# Patient Record
Sex: Female | Born: 1980
Health system: Southern US, Community
[De-identification: ages and names within clinical notes are randomized; demographics above are authoritative.]

## PROBLEM LIST (undated history)

## (undated) ENCOUNTER — Ambulatory Visit (HOSPITAL_COMMUNITY): Disposition: A | Discharge status: Other Acute Inpatient Hospital | Payer: 59

## (undated) ENCOUNTER — Inpatient Hospital Stay (HOSPITAL_COMMUNITY): Payer: Self-pay

## (undated) DIAGNOSIS — N83209 Unspecified ovarian cyst, unspecified side: Secondary | ICD-10-CM

## (undated) DIAGNOSIS — M549 Dorsalgia, unspecified: Secondary | ICD-10-CM

## (undated) DIAGNOSIS — N8501 Benign endometrial hyperplasia: Secondary | ICD-10-CM

## (undated) DIAGNOSIS — M5127 Other intervertebral disc displacement, lumbosacral region: Secondary | ICD-10-CM

## (undated) DIAGNOSIS — G8929 Other chronic pain: Secondary | ICD-10-CM

## (undated) DIAGNOSIS — K219 Gastro-esophageal reflux disease without esophagitis: Secondary | ICD-10-CM

## (undated) DIAGNOSIS — N200 Calculus of kidney: Secondary | ICD-10-CM

## (undated) DIAGNOSIS — F419 Anxiety disorder, unspecified: Secondary | ICD-10-CM

## (undated) DIAGNOSIS — IMO0002 Reserved for concepts with insufficient information to code with codable children: Secondary | ICD-10-CM

## (undated) DIAGNOSIS — R87619 Unspecified abnormal cytological findings in specimens from cervix uteri: Secondary | ICD-10-CM

## (undated) DIAGNOSIS — N809 Endometriosis, unspecified: Secondary | ICD-10-CM

## (undated) HISTORY — PX: BREAST ENHANCEMENT SURGERY: SHX7

## (undated) HISTORY — PX: LITHOTRIPSY: SUR834

## (undated) HISTORY — PX: DIAGNOSTIC LAPAROSCOPY: SUR761

## (undated) HISTORY — PX: CHOLECYSTECTOMY: SHX55

## (undated) SURGERY — Surgical Case
Anesthesia: *Unknown

---

## 2001-06-09 ENCOUNTER — Emergency Department (HOSPITAL_COMMUNITY): Admission: EM | Admit: 2001-06-09 | Discharge: 2001-06-09 | Payer: Self-pay | Admitting: Emergency Medicine

## 2002-07-05 ENCOUNTER — Emergency Department (HOSPITAL_COMMUNITY): Admission: EM | Admit: 2002-07-05 | Discharge: 2002-07-05 | Payer: Self-pay | Admitting: Emergency Medicine

## 2002-07-31 ENCOUNTER — Emergency Department (HOSPITAL_COMMUNITY): Admission: EM | Admit: 2002-07-31 | Discharge: 2002-07-31 | Payer: Self-pay | Admitting: *Deleted

## 2002-08-20 ENCOUNTER — Inpatient Hospital Stay (HOSPITAL_COMMUNITY): Admission: AD | Admit: 2002-08-20 | Discharge: 2002-08-20 | Payer: Self-pay | Admitting: *Deleted

## 2002-08-20 ENCOUNTER — Encounter: Payer: Self-pay | Admitting: *Deleted

## 2002-09-13 ENCOUNTER — Inpatient Hospital Stay (HOSPITAL_COMMUNITY): Admission: AD | Admit: 2002-09-13 | Discharge: 2002-09-13 | Payer: Self-pay | Admitting: Obstetrics and Gynecology

## 2002-09-22 ENCOUNTER — Encounter: Payer: Self-pay | Admitting: Obstetrics and Gynecology

## 2002-09-22 ENCOUNTER — Inpatient Hospital Stay (HOSPITAL_COMMUNITY): Admission: AD | Admit: 2002-09-22 | Discharge: 2002-09-22 | Payer: Self-pay | Admitting: Obstetrics and Gynecology

## 2002-10-02 ENCOUNTER — Inpatient Hospital Stay (HOSPITAL_COMMUNITY): Admission: AD | Admit: 2002-10-02 | Discharge: 2002-10-02 | Payer: Self-pay | Admitting: Obstetrics and Gynecology

## 2002-10-15 ENCOUNTER — Other Ambulatory Visit: Admission: RE | Admit: 2002-10-15 | Discharge: 2002-10-15 | Payer: Self-pay | Admitting: Obstetrics and Gynecology

## 2003-03-07 ENCOUNTER — Inpatient Hospital Stay (HOSPITAL_COMMUNITY): Admission: AD | Admit: 2003-03-07 | Discharge: 2003-03-07 | Payer: Self-pay | Admitting: Obstetrics and Gynecology

## 2003-10-15 ENCOUNTER — Emergency Department (HOSPITAL_COMMUNITY): Admission: EM | Admit: 2003-10-15 | Discharge: 2003-10-15 | Payer: Self-pay | Admitting: Emergency Medicine

## 2003-11-06 ENCOUNTER — Emergency Department (HOSPITAL_COMMUNITY): Admission: EM | Admit: 2003-11-06 | Discharge: 2003-11-06 | Payer: Self-pay | Admitting: Emergency Medicine

## 2003-12-07 ENCOUNTER — Emergency Department (HOSPITAL_COMMUNITY): Admission: EM | Admit: 2003-12-07 | Discharge: 2003-12-07 | Payer: Self-pay | Admitting: Family Medicine

## 2003-12-18 ENCOUNTER — Emergency Department (HOSPITAL_COMMUNITY): Admission: EM | Admit: 2003-12-18 | Discharge: 2003-12-19 | Payer: Self-pay | Admitting: Emergency Medicine

## 2004-04-26 ENCOUNTER — Emergency Department (HOSPITAL_COMMUNITY): Admission: EM | Admit: 2004-04-26 | Discharge: 2004-04-26 | Payer: Self-pay | Admitting: Emergency Medicine

## 2004-06-23 ENCOUNTER — Emergency Department (HOSPITAL_COMMUNITY): Admission: EM | Admit: 2004-06-23 | Discharge: 2004-06-23 | Payer: Self-pay | Admitting: Family Medicine

## 2004-09-23 ENCOUNTER — Emergency Department (HOSPITAL_COMMUNITY): Admission: EM | Admit: 2004-09-23 | Discharge: 2004-09-23 | Payer: Self-pay | Admitting: Family Medicine

## 2005-01-18 ENCOUNTER — Emergency Department (HOSPITAL_COMMUNITY): Admission: EM | Admit: 2005-01-18 | Discharge: 2005-01-18 | Payer: Self-pay | Admitting: Emergency Medicine

## 2005-02-05 ENCOUNTER — Emergency Department (HOSPITAL_COMMUNITY): Admission: EM | Admit: 2005-02-05 | Discharge: 2005-02-05 | Payer: Self-pay | Admitting: Emergency Medicine

## 2005-04-14 ENCOUNTER — Encounter: Admission: RE | Admit: 2005-04-14 | Discharge: 2005-04-14 | Payer: Self-pay | Admitting: Emergency Medicine

## 2005-04-22 ENCOUNTER — Emergency Department (HOSPITAL_COMMUNITY): Admission: EM | Admit: 2005-04-22 | Discharge: 2005-04-22 | Payer: Self-pay | Admitting: Emergency Medicine

## 2005-08-31 ENCOUNTER — Inpatient Hospital Stay (HOSPITAL_COMMUNITY): Admission: AD | Admit: 2005-08-31 | Discharge: 2005-09-01 | Payer: Self-pay | Admitting: Obstetrics and Gynecology

## 2005-09-02 ENCOUNTER — Ambulatory Visit (HOSPITAL_COMMUNITY): Payer: Self-pay | Admitting: Psychiatry

## 2005-10-13 ENCOUNTER — Emergency Department (HOSPITAL_COMMUNITY): Admission: EM | Admit: 2005-10-13 | Discharge: 2005-10-13 | Payer: Self-pay | Admitting: Emergency Medicine

## 2006-03-02 ENCOUNTER — Encounter: Admission: RE | Admit: 2006-03-02 | Discharge: 2006-03-02 | Payer: Self-pay | Admitting: Emergency Medicine

## 2006-04-16 ENCOUNTER — Emergency Department (HOSPITAL_COMMUNITY): Admission: EM | Admit: 2006-04-16 | Discharge: 2006-04-16 | Payer: Self-pay | Admitting: Emergency Medicine

## 2006-06-08 ENCOUNTER — Emergency Department (HOSPITAL_COMMUNITY): Admission: EM | Admit: 2006-06-08 | Discharge: 2006-06-08 | Payer: Self-pay | Admitting: Emergency Medicine

## 2006-10-14 ENCOUNTER — Emergency Department (HOSPITAL_COMMUNITY): Admission: EM | Admit: 2006-10-14 | Discharge: 2006-10-14 | Payer: Self-pay | Admitting: Family Medicine

## 2006-10-15 ENCOUNTER — Emergency Department (HOSPITAL_COMMUNITY): Admission: EM | Admit: 2006-10-15 | Discharge: 2006-10-15 | Payer: Self-pay | Admitting: Emergency Medicine

## 2006-10-25 ENCOUNTER — Emergency Department (HOSPITAL_COMMUNITY): Admission: EM | Admit: 2006-10-25 | Discharge: 2006-10-25 | Payer: Self-pay | Admitting: Emergency Medicine

## 2007-02-28 ENCOUNTER — Emergency Department (HOSPITAL_COMMUNITY): Admission: EM | Admit: 2007-02-28 | Discharge: 2007-02-28 | Payer: Self-pay | Admitting: Family Medicine

## 2007-04-15 ENCOUNTER — Emergency Department (HOSPITAL_COMMUNITY): Admission: EM | Admit: 2007-04-15 | Discharge: 2007-04-15 | Payer: Self-pay | Admitting: Emergency Medicine

## 2007-04-17 ENCOUNTER — Emergency Department (HOSPITAL_COMMUNITY): Admission: EM | Admit: 2007-04-17 | Discharge: 2007-04-17 | Payer: Self-pay | Admitting: Emergency Medicine

## 2007-04-21 ENCOUNTER — Encounter: Admission: RE | Admit: 2007-04-21 | Discharge: 2007-04-21 | Payer: Self-pay | Admitting: Emergency Medicine

## 2007-07-08 ENCOUNTER — Emergency Department (HOSPITAL_COMMUNITY): Admission: EM | Admit: 2007-07-08 | Discharge: 2007-07-08 | Payer: Self-pay | Admitting: Emergency Medicine

## 2008-02-12 ENCOUNTER — Emergency Department (HOSPITAL_COMMUNITY): Admission: EM | Admit: 2008-02-12 | Discharge: 2008-02-12 | Payer: Self-pay | Admitting: Emergency Medicine

## 2010-01-21 ENCOUNTER — Emergency Department (HOSPITAL_COMMUNITY): Admission: EM | Admit: 2010-01-21 | Discharge: 2010-01-21 | Payer: Self-pay | Admitting: Family Medicine

## 2010-04-08 ENCOUNTER — Emergency Department (HOSPITAL_COMMUNITY): Admission: EM | Admit: 2010-04-08 | Discharge: 2010-04-08 | Payer: Self-pay | Admitting: Emergency Medicine

## 2010-05-03 ENCOUNTER — Emergency Department (HOSPITAL_COMMUNITY): Admission: EM | Admit: 2010-05-03 | Discharge: 2010-05-03 | Payer: Self-pay | Admitting: Emergency Medicine

## 2010-10-28 LAB — URINALYSIS, ROUTINE W REFLEX MICROSCOPIC
Ketones, ur: NEGATIVE mg/dL
Specific Gravity, Urine: 1.021 (ref 1.005–1.030)

## 2010-10-28 LAB — RAPID STREP SCREEN (MED CTR MEBANE ONLY): Streptococcus, Group A Screen (Direct): NEGATIVE

## 2011-05-27 LAB — CLOSTRIDIUM DIFFICILE EIA: C difficile Toxins A+B, EIA: NEGATIVE

## 2011-05-27 LAB — OVA AND PARASITE EXAMINATION: Ova and parasites: NONE SEEN

## 2011-05-27 LAB — STOOL CULTURE

## 2011-08-08 ENCOUNTER — Encounter (HOSPITAL_COMMUNITY): Payer: Self-pay | Admitting: *Deleted

## 2011-08-08 ENCOUNTER — Inpatient Hospital Stay (HOSPITAL_COMMUNITY)

## 2011-08-08 ENCOUNTER — Inpatient Hospital Stay (HOSPITAL_COMMUNITY)
Admission: AD | Admit: 2011-08-08 | Discharge: 2011-08-08 | Disposition: A | Discharge status: Home / Self Care | Source: Ambulatory Visit | Attending: Obstetrics & Gynecology | Admitting: Obstetrics & Gynecology

## 2011-08-08 DIAGNOSIS — R1031 Right lower quadrant pain: Secondary | ICD-10-CM

## 2011-08-08 DIAGNOSIS — O99891 Other specified diseases and conditions complicating pregnancy: Secondary | ICD-10-CM | POA: Insufficient documentation

## 2011-08-08 HISTORY — DX: Unspecified abnormal cytological findings in specimens from cervix uteri: R87.619

## 2011-08-08 HISTORY — DX: Reserved for concepts with insufficient information to code with codable children: IMO0002

## 2011-08-08 HISTORY — DX: Benign endometrial hyperplasia: N85.01

## 2011-08-08 LAB — URINALYSIS, ROUTINE W REFLEX MICROSCOPIC
Bilirubin Urine: NEGATIVE
Hgb urine dipstick: NEGATIVE
Specific Gravity, Urine: >1.03 — ABNORMAL HIGH (ref 1.005–1.030)
pH: 6 (ref 5.0–8.0)

## 2011-08-08 LAB — WET PREP, GENITAL
Clue Cells Wet Prep HPF POC: NONE SEEN
Trich, Wet Prep: NONE SEEN
Yeast Wet Prep HPF POC: NONE SEEN

## 2011-08-08 LAB — CBC
MCH: 30.3 pg (ref 26.0–34.0)
MCHC: 34 g/dL (ref 30.0–36.0)
Platelets: 257 10*3/uL (ref 150–400)
RBC: 3.93 MIL/uL (ref 3.87–5.11)
RDW: 12.8 % (ref 11.5–15.5)

## 2011-08-08 LAB — DIFFERENTIAL
Basophils Absolute: 0 10*3/uL (ref 0.0–0.1)
Basophils Relative: 0 % (ref 0–1)
Eosinophils Absolute: 0.1 10*3/uL (ref 0.0–0.7)
Neutrophils Relative %: 67 % (ref 43–77)

## 2011-08-08 NOTE — Progress Notes (Signed)
HAS HAD 2 C/S- 1-  FTP , OTHER - REPEAT.

## 2011-08-08 NOTE — ED Provider Notes (Signed)
History     No chief complaint on file.  HPIJackie K Pace is 30 y.o. G3P2 Unknown weeks presenting with complaint of acute onset of pain this afternoon. Pain is on the right lower side, intermittent.   She is a patient of Dr. Tamela Oddi, last seen 12/5. Had 2 + pregnancy tests this weekend. Hx of endometriosis, had dx lap with cautery of implants/scar tissue in April by doctor in [place].  Is not on birth control.  Planned pregnancy. No period since April because she was on Lupron until a light period in October.  No bleeding November or December.  Last injection was 6/26.  Denies vaginal bleeding.     Past Medical History  Diagnosis Date  . Abnormal Pap smear   . Endometrial hyperplasia without atypia, simple     Past Surgical History  Procedure Date  . Cesarean section   . Laparoscopic vaginal hysterectomy     diagonostic lap for endmotrosis    Family History  Problem Relation Age of Onset  . Hypertension Mother     History  Substance Use Topics  . Smoking status: Never Smoker   . Smokeless tobacco: Never Used  . Alcohol Use: No    Allergies: Not on File  Prescriptions prior to admission  Medication Sig Dispense Refill  . Prenatal Vit-Fe Fumarate-FA (PRENATAL MULTIVITAMIN) TABS Take 1 tablet by mouth daily.        . Pseudoeph-Doxylamine-DM-APAP (NYQUIL PO) Take 5 mLs by mouth daily as needed. Patient was using this medication for cold/flu.       . zolpidem (AMBIEN) 5 MG tablet Take 2.5 mg by mouth at bedtime as needed. Patient is using 2.5 mg of this medication.  She is using this medication for sleep.         Review of Systems  Constitutional: Negative.   Gastrointestinal: Positive for abdominal pain (lower right pain).  Genitourinary:       Negative for UTI and vaginal sxs.   Physical Exam   Blood pressure 109/63, pulse 104, temperature 99.3 F (37.4 C), temperature source Oral, resp. rate 18, height 5\' 7"  (1.702 m), weight 221 lb 6 oz (100.415 kg), last  menstrual period 06/07/2011.  Physical Exam  Nursing note and vitals reviewed. Constitutional: She is oriented to person, place, and time. She appears well-developed and well-nourished. No distress.  HENT:  Head: Normocephalic.  Neck: Normal range of motion.  Cardiovascular: Normal rate.   Respiratory: Effort normal.  GI: Soft. She exhibits no distension and no mass. There is tenderness (right lower quadrant-mild). There is no rebound and no guarding.  Genitourinary: Uterus is not enlarged and not tender. Cervix exhibits no motion tenderness, no discharge and no friability. Right adnexum displays tenderness. Right adnexum displays no mass and no fullness. Left adnexum displays no mass, no tenderness and no fullness. No tenderness or bleeding around the vagina. No vaginal discharge found.  Neurological: She is alert and oriented to person, place, and time.  Skin: Skin is warm.    MAU Course  Procedures  GC/CHL culture to lab  MDM  Care turned over to E. Felipe Cabell, PA at 21:23   Assessment and Plan    Matt Holmes 08/08/2011, 8:37 PM   Matt Holmes, NP 08/08/11 2124  I have accepted care of this pt from Jeani Sow, FNP.  Results for orders placed during the hospital encounter of 08/08/11 (from the past 24 hour(s))  URINALYSIS, ROUTINE W REFLEX MICROSCOPIC     Status: Abnormal  Collection Time   08/08/11  7:52 PM      Component Value Range   Color, Urine YELLOW  YELLOW    APPearance CLEAR  CLEAR    Specific Gravity, Urine >1.030 (*) 1.005 - 1.030    pH 6.0  5.0 - 8.0    Glucose, UA NEGATIVE  NEGATIVE (mg/dL)   Hgb urine dipstick NEGATIVE  NEGATIVE    Bilirubin Urine NEGATIVE  NEGATIVE    Ketones, ur NEGATIVE  NEGATIVE (mg/dL)   Protein, ur NEGATIVE  NEGATIVE (mg/dL)   Urobilinogen, UA 0.2  0.0 - 1.0 (mg/dL)   Nitrite NEGATIVE  NEGATIVE    Leukocytes, UA NEGATIVE  NEGATIVE   POCT PREGNANCY, URINE     Status: Normal   Collection Time   08/08/11  8:01 PM      Component Value Range     Preg Test, Ur POSITIVE    WET PREP, GENITAL     Status: Abnormal   Collection Time   08/08/11  8:50 PM      Component Value Range   Yeast, Wet Prep NONE SEEN  NONE SEEN    Trich, Wet Prep NONE SEEN  NONE SEEN    Clue Cells, Wet Prep NONE SEEN  NONE SEEN    WBC, Wet Prep HPF POC MODERATE (*) NONE SEEN   ABO/RH     Status: Normal   Collection Time   08/08/11  9:05 PM      Component Value Range   ABO/RH(D) O POS    CBC     Status: Abnormal   Collection Time   08/08/11  9:05 PM      Component Value Range   WBC 11.2 (*) 4.0 - 10.5 (K/uL)   RBC 3.93  3.87 - 5.11 (MIL/uL)   Hemoglobin 11.9 (*) 12.0 - 15.0 (g/dL)   HCT 40.9 (*) 81.1 - 46.0 (%)   MCV 89.1  78.0 - 100.0 (fL)   MCH 30.3  26.0 - 34.0 (pg)   MCHC 34.0  30.0 - 36.0 (g/dL)   RDW 91.4  78.2 - 95.6 (%)   Platelets 257  150 - 400 (K/uL)  DIFFERENTIAL     Status: Normal   Collection Time   08/08/11  9:05 PM      Component Value Range   Neutrophils Relative 67  43 - 77 (%)   Neutro Abs 7.5  1.7 - 7.7 (K/uL)   Lymphocytes Relative 25  12 - 46 (%)   Lymphs Abs 2.8  0.7 - 4.0 (K/uL)   Monocytes Relative 6  3 - 12 (%)   Monocytes Absolute 0.7  0.1 - 1.0 (K/uL)   Eosinophils Relative 1  0 - 5 (%)   Eosinophils Absolute 0.1  0.0 - 0.7 (K/uL)   Basophils Relative 0  0 - 1 (%)   Basophils Absolute 0.0  0.0 - 0.1 (K/uL)    US shows single IUP with yolk sac. EGA of 5.1wks with EDC of 04/08/12.  Pt will f/u with Dr. Gaynell Face. Discussed diet, activity, risks, and precautions.  Clinton Gallant. Elgie Maziarz III, DrHSc, MPAS, PA-C   Henrietta Hoover, [place] 08/08/11 2153

## 2011-08-08 NOTE — Progress Notes (Signed)
NL CYCLE IN OCT.  THEN NOV- NO CYCLE-  NO CYCLE IN DEC- BREAST TENDER- DID  2 HOME PREG TEST- BOTH POST-- TOOK  TEST SAT AND SUN.     SAYS PAIN STARTED IN UPPER ABD AT 4AM ON Sunday- TOOK GASEX-- PAIN WENT AWAY-   SAYS  R   SIDED   PAIN STARTED  AT 530PM.- DID NOT TAKE ANYTHING.  EVERYTIME SHE EATS- SHE FEELS BLOATED.   NO VAG BLEEDING.  USED LUPRON IN April AND June.

## 2011-08-10 LAB — GC/CHLAMYDIA PROBE AMP, GENITAL
Chlamydia, DNA Probe: NEGATIVE
GC Probe Amp, Genital: NEGATIVE

## 2011-09-05 ENCOUNTER — Inpatient Hospital Stay (HOSPITAL_COMMUNITY)
Admission: AD | Admit: 2011-09-05 | Discharge: 2011-09-05 | Disposition: A | Discharge status: Home / Self Care | Source: Ambulatory Visit | Attending: Obstetrics | Admitting: Obstetrics

## 2011-09-05 ENCOUNTER — Encounter (HOSPITAL_COMMUNITY): Payer: Self-pay | Admitting: *Deleted

## 2011-09-05 DIAGNOSIS — N390 Urinary tract infection, site not specified: Secondary | ICD-10-CM

## 2011-09-05 DIAGNOSIS — R300 Dysuria: Secondary | ICD-10-CM | POA: Insufficient documentation

## 2011-09-05 LAB — URINALYSIS, ROUTINE W REFLEX MICROSCOPIC
Ketones, ur: NEGATIVE mg/dL
Leukocytes, UA: NEGATIVE
Protein, ur: NEGATIVE mg/dL
Urobilinogen, UA: 0.2 mg/dL (ref 0.0–1.0)

## 2011-09-05 MED ORDER — PHENAZOPYRIDINE HCL 200 MG PO TABS: 200.0000 mg | ORAL_TABLET | Freq: Three times a day (TID) | ORAL | Status: AC

## 2011-09-05 MED ORDER — PHENAZOPYRIDINE HCL 100 MG PO TABS
200.0000 mg | ORAL_TABLET | Freq: Once | ORAL | Status: AC
  Administered 2011-09-05: 200 mg via ORAL
  Filled 2011-09-05: qty 2

## 2011-09-05 MED ORDER — CEPHALEXIN 500 MG PO CAPS: 500.0000 mg | ORAL_CAPSULE | Freq: Four times a day (QID) | ORAL | Status: AC

## 2011-09-05 NOTE — Progress Notes (Signed)
Pt c/o lower abd pain and pain upon urination for the past two days.  Tried to sit in tub tonight for relief, and as soon as she got out, the pain worsened.  Has an appt tomorrow in office, but could not wait that long.

## 2011-09-05 NOTE — ED Provider Notes (Signed)
History   Pt presents today c/o dysuria and bladder spasms. She states she feels like she is having her typical bladder infection. She has an appt in the am but states she couldn't wait to be seen. She denies fever, vag dc, bleeding.  No chief complaint on file.  HPI  OB History    Grav Para Term Preterm Abortions TAB SAB Ect Mult Living   3 2        2       Past Medical History  Diagnosis Date  . Abnormal Pap smear   . Endometrial hyperplasia without atypia, simple   . Chronic kidney disease   . Pregnancy induced hypertension     Past Surgical History  Procedure Date  . Cesarean section   . Laparoscopic vaginal hysterectomy     diagonostic lap for endmotrosis    Family History  Problem Relation Age of Onset  . Hypertension Mother     History  Substance Use Topics  . Smoking status: Former Smoker -- 3 years    Types: Cigarettes    Quit date: 09/05/2007  . Smokeless tobacco: Never Used  . Alcohol Use: No    Allergies: No Known Allergies  Prescriptions prior to admission  Medication Sig Dispense Refill  . diphenhydrAMINE (BENADRYL) 25 mg capsule Take 25 mg by mouth every 6 (six) hours as needed. Takes as needed,      . Prenatal Vit-Fe Fumarate-FA (PRENATAL MULTIVITAMIN) TABS Take 1 tablet by mouth daily.        Review of Systems  Constitutional: Negative for fever.  Eyes: Negative for blurred vision and double vision.  Cardiovascular: Negative for chest pain and palpitations.  Gastrointestinal: Positive for abdominal pain. Negative for nausea, vomiting, diarrhea and constipation.  Genitourinary: Positive for dysuria, urgency and frequency. Negative for hematuria and flank pain.  Neurological: Negative for dizziness and headaches.  Psychiatric/Behavioral: Negative for depression and suicidal ideas.   Physical Exam   Blood pressure 115/56, pulse 92, temperature 98.5 F (36.9 C), temperature source Oral, resp. rate 16, height 5\' 8"  (1.727 m), weight 220 lb  (99.791 kg), last menstrual period 06/07/2011.  Physical Exam  Nursing note and vitals reviewed. Constitutional: She is oriented to person, place, and time. She appears well-developed and well-nourished. No distress.  HENT:  Head: Normocephalic and atraumatic.  Eyes: EOM are normal. Pupils are equal, round, and reactive to light.  GI: Soft. She exhibits no distension. There is tenderness (tenderness over bladder). There is no rebound and no guarding.  Genitourinary: There is no lesion on the right labia. There is no lesion on the left labia. No erythema, tenderness or bleeding around the vagina. No vaginal discharge found.  Neurological: She is alert and oriented to person, place, and time.  Skin: Skin is warm and dry. She is not diaphoretic.  Psychiatric: She has a normal mood and affect. Her behavior is normal. Judgment and thought content normal.    MAU Course  Procedures  Results for orders placed during the hospital encounter of 09/05/11 (from the past 24 hour(s))  URINALYSIS, ROUTINE W REFLEX MICROSCOPIC     Status: Abnormal   Collection Time   09/05/11 11:00 PM      Component Value Range   Color, Urine YELLOW  YELLOW    APPearance CLEAR  CLEAR    Specific Gravity, Urine >1.030 (*) 1.005 - 1.030    pH 5.5  5.0 - 8.0    Glucose, UA NEGATIVE  NEGATIVE (mg/dL)   Hgb  urine dipstick NEGATIVE  NEGATIVE    Bilirubin Urine NEGATIVE  NEGATIVE    Ketones, ur NEGATIVE  NEGATIVE (mg/dL)   Protein, ur NEGATIVE  NEGATIVE (mg/dL)   Urobilinogen, UA 0.2  0.0 - 1.0 (mg/dL)   Nitrite NEGATIVE  NEGATIVE    Leukocytes, UA NEGATIVE  NEGATIVE    Urine sent for culture.  Assessment and Plan  Dysuria: despite negative UA will tx with keflex and pyridium. She will keep her appt in the am. Discussed diet, activity, risks, and precautions.  Clinton Gallant. Andriana Casa III, DrHSc, MPAS, PA-C  09/05/2011, 11:36 PM   Henrietta Hoover, PA 09/05/11 2340

## 2011-09-06 LAB — OB RESULTS CONSOLE ABO/RH: RH Type: POSITIVE

## 2011-09-09 ENCOUNTER — Ambulatory Visit: Payer: Self-pay | Admitting: Obstetrics

## 2011-09-30 ENCOUNTER — Inpatient Hospital Stay (HOSPITAL_COMMUNITY)
Admission: AD | Admit: 2011-09-30 | Discharge: 2011-09-30 | Disposition: A | Discharge status: Home / Self Care | Source: Ambulatory Visit | Attending: Obstetrics | Admitting: Obstetrics

## 2011-09-30 ENCOUNTER — Encounter (HOSPITAL_COMMUNITY): Payer: Self-pay

## 2011-09-30 DIAGNOSIS — R519 Headache, unspecified: Secondary | ICD-10-CM

## 2011-09-30 DIAGNOSIS — O26899 Other specified pregnancy related conditions, unspecified trimester: Secondary | ICD-10-CM

## 2011-09-30 DIAGNOSIS — R51 Headache: Secondary | ICD-10-CM

## 2011-09-30 DIAGNOSIS — O99891 Other specified diseases and conditions complicating pregnancy: Secondary | ICD-10-CM | POA: Insufficient documentation

## 2011-09-30 DIAGNOSIS — G43909 Migraine, unspecified, not intractable, without status migrainosus: Secondary | ICD-10-CM | POA: Insufficient documentation

## 2011-09-30 LAB — URINALYSIS, ROUTINE W REFLEX MICROSCOPIC
Bilirubin Urine: NEGATIVE
Ketones, ur: NEGATIVE mg/dL
Leukocytes, UA: NEGATIVE
Nitrite: NEGATIVE
Protein, ur: NEGATIVE mg/dL

## 2011-09-30 MED ORDER — LACTATED RINGERS IV BOLUS (SEPSIS)
1000.0000 mL | Freq: Once | INTRAVENOUS | Status: AC
  Administered 2011-09-30: 1000 mL via INTRAVENOUS

## 2011-09-30 MED ORDER — METOCLOPRAMIDE HCL 5 MG/ML IJ SOLN
10.0000 mg | Freq: Once | INTRAMUSCULAR | Status: AC
  Administered 2011-09-30: 10 mg via INTRAVENOUS
  Filled 2011-09-30: qty 2

## 2011-09-30 MED ORDER — DIPHENHYDRAMINE HCL 50 MG/ML IJ SOLN
25.0000 mg | Freq: Once | INTRAMUSCULAR | Status: AC
  Administered 2011-09-30: 50 mg via INTRAVENOUS
  Filled 2011-09-30: qty 1

## 2011-09-30 MED ORDER — DEXAMETHASONE SODIUM PHOSPHATE 10 MG/ML IJ SOLN
10.0000 mg | Freq: Once | INTRAMUSCULAR | Status: AC
  Administered 2011-09-30: 10 mg via INTRAVENOUS
  Filled 2011-09-30: qty 1

## 2011-09-30 NOTE — ED Notes (Signed)
Patient given gingerale and saltine crackers for po challenge.

## 2011-09-30 NOTE — ED Provider Notes (Signed)
History     Chief Complaint  Patient presents with  . Migraine   HPI  Pt is [redacted] weeks pregnant with onset of migraine headache started about yesterday about lunch time.  She took Tylenol at lunch and then took Unisom at bedtime.  She started vomitiing this morning.  She is light sensitive with nausea and vomiting.  She has a history of migraines and has been given Glass blower/designer .  She denies numbness, tingling or spots before her eyes.  She denies vaginal bleeding or cramping or UTI symtpoms.  She has not had any complications with this pregnancy.    Past Medical History  Diagnosis Date  . Abnormal Pap smear   . Endometrial hyperplasia without atypia, simple   . Chronic kidney disease   . Pregnancy induced hypertension     Past Surgical History  Procedure Date  . Cesarean section   . Laparoscopic vaginal hysterectomy     diagonostic lap for endmotrosis    Family History  Problem Relation Age of Onset  . Hypertension Mother     History  Substance Use Topics  . Smoking status: Former Smoker -- 3 years    Types: Cigarettes    Quit date: 09/05/2007  . Smokeless tobacco: Never Used  . Alcohol Use: No    Allergies: No Known Allergies  Prescriptions prior to admission  Medication Sig Dispense Refill  . acetaminophen (TYLENOL) 325 MG tablet Take 650 mg by mouth every 6 (six) hours as needed. Takes for pain      . doxylamine, Sleep, (UNISOM) 25 MG tablet Take 25 mg by mouth at bedtime as needed. Takes for sleep      . Prenatal Vit-Fe Fumarate-FA (PRENATAL MULTIVITAMIN) TABS Take 1 tablet by mouth daily.        Review of Systems  Constitutional: Negative for fever and chills.  HENT: Negative for hearing loss and tinnitus.   Eyes: Positive for photophobia. Negative for blurred vision and double vision.  Gastrointestinal: Positive for nausea. Negative for heartburn, abdominal pain, diarrhea and constipation.  Genitourinary: Negative for dysuria and urgency.  Neurological:  Positive for headaches. Negative for dizziness, tingling, sensory change and focal weakness.   Physical Exam   Blood pressure 111/65, pulse 95, temperature 98.8 F (37.1 C), temperature source Oral, resp. rate 20, height 5' 7.5" (1.715 m), weight 237 lb (107.502 kg), last menstrual period 06/07/2011, SpO2 99.00%.  Physical Exam  Nursing note and vitals reviewed. Constitutional: She is oriented to person, place, and time. She appears well-developed and well-nourished.       Uncomfortable appearing  HENT:  Head: Normocephalic.  Eyes: Pupils are equal, round, and reactive to light.  Neck: Normal range of motion. Neck supple.  Cardiovascular: Normal rate.   Respiratory: Effort normal.  GI: Soft.  Musculoskeletal: Normal range of motion.  Neurological: She is alert and oriented to person, place, and time. She has normal reflexes.  Skin: Skin is warm.  Psychiatric: She has a normal mood and affect.    MAU Course  Procedures  1000cc of LR with Decadron, Reglan and Benadryl IV given to pt- pt's headache pain was reduced to 2/10.  Pt has prescription for Fioricet at home for headache if needed  Assessment and Plan  Migraine Headache in pregnancy F/u with Dr. Clearance Coots for return of symptoms  Romond Pipkins 09/30/2011, 8:09 AM

## 2011-09-30 NOTE — Progress Notes (Signed)
Headache started yesterday, has become a migraine- pain in left eye. N/v started last night.  Took unisom. Pt is light sensative, vomiting continues

## 2011-10-03 ENCOUNTER — Encounter: Payer: Self-pay | Admitting: Maternal & Fetal Medicine

## 2011-11-14 ENCOUNTER — Encounter: Payer: Self-pay | Admitting: Maternal & Fetal Medicine

## 2011-12-08 ENCOUNTER — Inpatient Hospital Stay (HOSPITAL_COMMUNITY)
Admission: AD | Admit: 2011-12-08 | Discharge: 2011-12-08 | Disposition: A | Discharge status: Home / Self Care | Source: Ambulatory Visit | Attending: Obstetrics & Gynecology | Admitting: Obstetrics & Gynecology

## 2011-12-08 ENCOUNTER — Encounter (HOSPITAL_COMMUNITY): Payer: Self-pay | Admitting: *Deleted

## 2011-12-08 DIAGNOSIS — H109 Unspecified conjunctivitis: Secondary | ICD-10-CM | POA: Insufficient documentation

## 2011-12-08 DIAGNOSIS — O99891 Other specified diseases and conditions complicating pregnancy: Secondary | ICD-10-CM | POA: Insufficient documentation

## 2011-12-08 DIAGNOSIS — H579 Unspecified disorder of eye and adnexa: Secondary | ICD-10-CM | POA: Insufficient documentation

## 2011-12-08 MED ORDER — TOBRAMYCIN 0.3 % OP SOLN: 1.0000 [drp] | OPHTHALMIC | Status: AC

## 2011-12-08 NOTE — Discharge Instructions (Signed)
Take benadryl for itching and allergies. Use the eye drops as directed. If your symptoms do not improve call Dr. Bronson Ing office for follow up.  Conjunctivitis Conjunctivitis is commonly called "pink eye." Conjunctivitis can be caused by bacterial or viral infection, allergies, or injuries. There is usually redness of the lining of the eye, itching, discomfort, and sometimes discharge. There may be deposits of matter along the eyelids. A viral infection usually causes a watery discharge, while a bacterial infection causes a yellowish, thick discharge. Pink eye is very contagious and spreads by direct contact. You may be given antibiotic eyedrops as part of your treatment. Before using your eye medicine, remove all drainage from the eye by washing gently with warm water and cotton balls. Continue to use the medication until you have awakened 2 mornings in a row without discharge from the eye. Do not rub your eye. This increases the irritation and helps spread infection. Use separate towels from other household members. Wash your hands with soap and water before and after touching your eyes. Use cold compresses to reduce pain and sunglasses to relieve irritation from light. Do not wear contact lenses or wear eye makeup until the infection is gone. SEEK MEDICAL CARE IF:   Your symptoms are not better after 3 days of treatment.   You have increased pain or trouble seeing.   The outer eyelids become very red or swollen.  Document Released: 09/08/2004 Document Revised: 07/21/2011 Document Reviewed: 08/01/2005 [facility] Patient Information 2012 [place], [place].

## 2011-12-08 NOTE — MAU Note (Signed)
L eye very sore & sensitive since this a.m.  Eye appears very read, watery.  Pt C/O round ligament pain, denies uc's, bleeding, or LOF.

## 2011-12-08 NOTE — MAU Provider Note (Signed)
History     CSN: [id number]  Arrival date & time 12/08/11  1449   None     Chief Complaint  Patient presents with  . Eye Pain    HPI Ana Pace is a 31 y.o. female @ [redacted]w[redacted]d gestation who presents to MAU for eye itching and irritation. The symptoms started this morning and have gotten worse as the day has progressed. Both eyes are affected. She denies any problems with the pregnancy other than round ligament pain which her doctor has told her to get a maternity girdle for support. The history was provided by the patient.  Past Medical History  Diagnosis Date  . Abnormal Pap smear   . Endometrial hyperplasia without atypia, simple   . Pregnancy induced hypertension     Past Surgical History  Procedure Date  . Cesarean section   . Laparoscopic vaginal hysterectomy     diagonostic lap for endmotrosis    Family History  Problem Relation Age of Onset  . Hypertension Mother     History  Substance Use Topics  . Smoking status: Former Smoker -- 3 years    Types: Cigarettes    Quit date: 09/05/2007  . Smokeless tobacco: Never Used  . Alcohol Use: No    OB History    Grav Para Term Preterm Abortions TAB SAB Ect Mult Living   3 2        2       Review of Systems  Constitutional: Negative for fever, chills and activity change.  HENT: Positive for congestion and sore throat. Negative for facial swelling and neck pain.   Eyes: Positive for photophobia, redness and itching.  Cardiovascular: Negative for chest pain.  Gastrointestinal: Negative for nausea and vomiting.       Round ligament pain.  Genitourinary: Negative for dysuria.  Musculoskeletal: Positive for back pain.  Neurological: Negative for dizziness and headaches.  Psychiatric/Behavioral: Negative for confusion.    Allergies  Review of patient's allergies indicates no known allergies.  Home Medications  No current outpatient prescriptions on file.  BP 127/64  Pulse 99  Temp(Src) 98.9 F (37.2 C)  (Oral)  Resp 20  Ht 5\' 8"  (1.727 m)  Wt 249 lb (112.946 kg)  BMI 37.86 kg/m2  LMP 06/07/2011  Physical Exam  Nursing note and vitals reviewed. Constitutional: She is oriented to person, place, and time. She appears well-developed and well-nourished. No distress.  HENT:  Head: Normocephalic.  Eyes: EOM and lids are normal. Pupils are equal, round, and reactive to light. Right conjunctiva is injected. Left conjunctiva is injected.       Conjunctiva injected. Sclera with erythema.   Neck: Neck supple.  Cardiovascular: Normal rate.   Pulmonary/Chest: Effort normal.  Abdominal: Soft.       Gravid consistent with dates. Positive FHT's.  Musculoskeletal: Normal range of motion.  Neurological: She is alert and oriented to person, place, and time. No cranial nerve deficit.  Skin: Skin is warm and dry.  Psychiatric: She has a normal mood and affect. Her behavior is normal. Judgment and thought content normal.   Assessment: Conjunctivitis  Plan:  Benadryl for allergies   Tobramycin opth. Drops Rx   Follow up with Dr. Tamela Pace or go for  Opthalmology follow up    Return as needed   ED Course  Procedures  MDM

## 2012-01-21 ENCOUNTER — Inpatient Hospital Stay (HOSPITAL_COMMUNITY)
Admission: AD | Admit: 2012-01-21 | Discharge: 2012-01-21 | Disposition: A | Discharge status: Home / Self Care | Source: Ambulatory Visit | Attending: Obstetrics & Gynecology | Admitting: Obstetrics & Gynecology

## 2012-01-21 DIAGNOSIS — J309 Allergic rhinitis, unspecified: Secondary | ICD-10-CM

## 2012-01-21 MED ORDER — FEXOFENADINE-PSEUDOEPHED ER 60-120 MG PO TB12: 1.0000 | ORAL_TABLET | Freq: Two times a day (BID) | ORAL | Status: DC

## 2012-01-21 NOTE — MAU Note (Signed)
Patient c/o of left ear infection onset two days, no fever 29 weeks.

## 2012-01-21 NOTE — MAU Provider Note (Signed)
History   Pt presents today c/o Lt earache. She states it feels like she is hearing out of a drum. She also c/o itchy ears and throat. She denies SOB, chest pain, cough, fever, or any other problems at this time.  CSN: [id number]  Arrival date and time: 01/21/12 1518   None     Chief Complaint  Patient presents with  . Otalgia   HPI  OB History    Grav Para Term Preterm Abortions TAB SAB Ect Mult Living   3 2        2       Past Medical History  Diagnosis Date  . Abnormal Pap smear   . Endometrial hyperplasia without atypia, simple   . Pregnancy induced hypertension     Past Surgical History  Procedure Date  . Cesarean section   . Laparoscopic vaginal hysterectomy     diagonostic lap for endmotrosis    Family History  Problem Relation Age of Onset  . Hypertension Mother     History  Substance Use Topics  . Smoking status: Former Smoker -- 3 years    Types: Cigarettes    Quit date: 09/05/2007  . Smokeless tobacco: Never Used  . Alcohol Use: No    Allergies: No Known Allergies  Prescriptions prior to admission  Medication Sig Dispense Refill  . acetaminophen (TYLENOL) 325 MG tablet Take 650 mg by mouth every 6 (six) hours as needed. Takes for pain,      . diphenhydrAMINE (BENADRYL) 25 MG tablet Take 25 mg by mouth every 6 (six) hours as needed. pollin allergy      . doxylamine, Sleep, (UNISOM) 25 MG tablet Take 25 mg by mouth at bedtime as needed. Takes for sleep      . Prenatal Vit-Fe Fumarate-FA (PRENATAL MULTIVITAMIN) TABS Take 1 tablet by mouth daily.        Review of Systems  Constitutional: Negative for fever and chills.  HENT: Positive for ear pain, congestion and sore throat. Negative for hearing loss and ear discharge.   Eyes: Negative for blurred vision, double vision, photophobia, pain and discharge.  Respiratory: Negative for stridor.   Cardiovascular: Negative for chest pain and palpitations.  Neurological: Negative for headaches.    Physical Exam   Blood pressure 111/62, pulse 97, temperature 99 F (37.2 C), temperature source Oral, resp. rate 16, height 5\' 8"  (1.727 m), weight 259 lb 6.4 oz (117.663 kg), last menstrual period 06/07/2011.  Physical Exam  Constitutional: She is oriented to person, place, and time. She appears well-developed and well-nourished. No distress.  HENT:  Head: Normocephalic and atraumatic.  Right Ear: External ear normal.  Left Ear: External ear normal.  Nose: Nose normal.  Mouth/Throat: Oropharynx is clear and moist. No oropharyngeal exudate.  Eyes: EOM are normal. Pupils are equal, round, and reactive to light.  Neck: Normal range of motion.  GI: Soft. She exhibits no distension and no mass. There is no tenderness. There is no rebound and no guarding.  Neurological: She is alert and oriented to person, place, and time.  Skin: Skin is warm and dry. She is not diaphoretic.  Psychiatric: She has a normal mood and affect. Her behavior is normal. Judgment and thought content normal.    MAU Course  Procedures    Assessment and Plan  Allergic rhinitis: discussed with pt at length. Will give Rx for Allegra. She will f/u with Dr. Tamela Oddi. Discussed diet, activity, risks, and precautions.  Clinton Gallant. Kensington Duerst III, DrHSc, MPAS,  PA-C  01/21/2012, 4:30 PM

## 2012-02-08 ENCOUNTER — Other Ambulatory Visit: Payer: Self-pay | Admitting: Obstetrics & Gynecology

## 2012-02-08 DIAGNOSIS — Z3689 Encounter for other specified antenatal screening: Secondary | ICD-10-CM

## 2012-02-13 ENCOUNTER — Other Ambulatory Visit: Payer: Self-pay | Admitting: Obstetrics & Gynecology

## 2012-02-13 ENCOUNTER — Ambulatory Visit (HOSPITAL_COMMUNITY)
Admission: RE | Admit: 2012-02-13 | Discharge: 2012-02-13 | Disposition: A | Discharge status: Home / Self Care | Payer: Medicaid Other | Source: Ambulatory Visit | Attending: Obstetrics & Gynecology | Admitting: Obstetrics & Gynecology

## 2012-02-13 DIAGNOSIS — Z3689 Encounter for other specified antenatal screening: Secondary | ICD-10-CM

## 2012-03-13 ENCOUNTER — Encounter (HOSPITAL_COMMUNITY): Payer: Self-pay | Admitting: *Deleted

## 2012-03-13 ENCOUNTER — Inpatient Hospital Stay (HOSPITAL_COMMUNITY)
Admission: AD | Admit: 2012-03-13 | Discharge: 2012-03-13 | Disposition: A | Discharge status: Home / Self Care | Payer: Medicaid Other | Source: Ambulatory Visit | Attending: Obstetrics | Admitting: Obstetrics

## 2012-03-13 DIAGNOSIS — O99891 Other specified diseases and conditions complicating pregnancy: Secondary | ICD-10-CM | POA: Insufficient documentation

## 2012-03-13 DIAGNOSIS — N898 Other specified noninflammatory disorders of vagina: Secondary | ICD-10-CM | POA: Insufficient documentation

## 2012-03-13 DIAGNOSIS — R109 Unspecified abdominal pain: Secondary | ICD-10-CM | POA: Insufficient documentation

## 2012-03-13 DIAGNOSIS — N949 Unspecified condition associated with female genital organs and menstrual cycle: Secondary | ICD-10-CM | POA: Insufficient documentation

## 2012-03-13 NOTE — MAU Note (Signed)
Patient states she has a cyst on the left labia for about one week. Has been having lower abdominal pressure since Sunday and getting worse. Denies any bleeding or leaking and reports good fetal movement.

## 2012-03-13 NOTE — MAU Note (Signed)
Pt states she has a cyst on her left labia,Pt states it looks like a Bartholins Cyst. Pt states she has never had a cyst before.Pt also complaining of pressure and frequent urination

## 2012-03-13 NOTE — MAU Provider Note (Signed)
  History     CSN: [id number]  Arrival date and time: 03/13/12 1450   First Provider Initiated Contact with Patient 03/13/12 1557      Chief Complaint  Patient presents with  . Abdominal Pain   HPI  Ana Pace is 31 y.o. G3P2 [redacted]w[redacted]d weeks presenting with vaginal soreness and bump.  She also reports having several contractions while in the room.  She is a patient of Dr. Thomes Lolling.  She denies vaginal discharge or bleeding.  + fetal movement.    Past Medical History  Diagnosis Date  . Abnormal Pap smear   . Endometrial hyperplasia without atypia, simple   . Pregnancy induced hypertension     Past Surgical History  Procedure Date  . Cesarean section   . Laparoscopic vaginal hysterectomy     diagonostic lap for endmotrosis    Family History  Problem Relation Age of Onset  . Hypertension Mother     History  Substance Use Topics  . Smoking status: Former Smoker -- 3 years    Types: Cigarettes    Quit date: 09/05/2007  . Smokeless tobacco: Never Used  . Alcohol Use: No    Allergies: No Known Allergies  Prescriptions prior to admission  Medication Sig Dispense Refill  . acetaminophen (TYLENOL) 325 MG tablet Take 650 mg by mouth every 6 (six) hours as needed. for pain      . diphenhydrAMINE (BENADRYL) 25 MG tablet Take 25 mg by mouth every 6 (six) hours as needed. For allergies      . flintstones complete (FLINTSTONES) 60 MG chewable tablet Chew 2 tablets by mouth every morning.        Review of Systems  Constitutional: Negative.   HENT: Negative.   Respiratory: Negative.   Cardiovascular: Negative.   Gastrointestinal: Positive for abdominal pain (occ contraction).  Genitourinary: Negative for dysuria, urgency, frequency and hematuria.       + vaginal bump/soreness.  NEg for bleeding   Physical Exam   Blood pressure 118/71, pulse 96, temperature 99 F (37.2 C), temperature source Oral, resp. rate 18, height 5\' 7"  (1.702 m), weight 119.931 kg (264 lb 6.4 oz),  last menstrual period 06/07/2011, SpO2 100.00%.  Physical Exam  Constitutional: She appears well-developed and well-nourished. No distress.  HENT:  Head: Normocephalic.  Neck: Normal range of motion.  Cardiovascular: Normal rate.   Respiratory: Effort normal.  GI: Soft. There is no tenderness.       gravid  Genitourinary: Uterus is enlarged. Uterus is not tender. No tenderness or bleeding around the vagina. No vaginal discharge found.       Cervical exam by Belinda, RN-closed.   There is a pea size nodule just inside the vaginal on her left.  It is tender to touch.  Yellow in color.  No drainage.  No ulceration.    MAU Course  Procedures   FMS  Baseline 145, reactive.  Irregular contraction  MDM Reported MSE to Dr. Clearance Coots.  Order given for follow up in office.  May use warm soaks.  Assessment and Plan  A:  Sebaceous Cyst just inside vagina     [redacted]w[redacted]d gestation  P:  Keep scheduled appointment with DR. Harper     Warm soaks    Report worsening sxs to Dr. Clearance Coots    Return for increased contractions, leaking of fluid, vaginal bleeding or decreased fetal movement  Ladashia Demarinis,EVE M 03/13/2012, 4:59 PM

## 2012-03-29 ENCOUNTER — Other Ambulatory Visit: Payer: Self-pay | Admitting: Obstetrics & Gynecology

## 2012-03-29 ENCOUNTER — Telehealth (HOSPITAL_COMMUNITY): Payer: Self-pay | Admitting: *Deleted

## 2012-03-29 NOTE — Telephone Encounter (Signed)
Preadmission screen  

## 2012-03-30 ENCOUNTER — Encounter (HOSPITAL_COMMUNITY)
Admission: RE | Admit: 2012-03-30 | Discharge: 2012-03-30 | Disposition: A | Discharge status: Home / Self Care | Payer: Medicaid Other | Source: Ambulatory Visit | Attending: Obstetrics & Gynecology | Admitting: Obstetrics & Gynecology

## 2012-03-30 ENCOUNTER — Encounter (HOSPITAL_COMMUNITY): Payer: Self-pay

## 2012-03-30 HISTORY — DX: Gastro-esophageal reflux disease without esophagitis: K21.9

## 2012-03-30 LAB — CBC
HCT: 40.7 % (ref 36.0–46.0)
MCHC: 28.5 g/dL — ABNORMAL LOW (ref 30.0–36.0)
Platelets: 173 10*3/uL (ref 150–400)
RDW: 13.7 % (ref 11.5–15.5)
WBC: 13.5 10*3/uL — ABNORMAL HIGH (ref 4.0–10.5)

## 2012-03-30 LAB — TYPE AND SCREEN

## 2012-03-30 LAB — SURGICAL PCR SCREEN
MRSA, PCR: NEGATIVE
Staphylococcus aureus: NEGATIVE

## 2012-03-30 NOTE — Patient Instructions (Addendum)
   Your procedure is scheduled on: Monday August 19th  Enter through the [employer] of [facility] at: 11:30am Pick up the phone at the desk and dial [phone] and inform us of your arrival.  Please call this number if you have any problems the morning of surgery: [phone]  Remember: Do not eat food after midnight: Sunday Do not drink clear liquids after: 9am Monday Take these medicines the morning of surgery with a SIP OF WATER: none  Do not wear jewelry, make-up, or FINGER nail polish No metal in your hair or on your body. Do not wear lotions, powders, perfumes or deodorant. Do not shave 48 hours prior to surgery. Do not bring valuables to the hospital. Contacts, dentures or bridgework may not be worn into surgery.  Leave suitcase in the car. After Surgery it may be brought to your room. For patients being admitted to the hospital, checkout time is 11:00am the day of discharge.     Remember to use your hibiclens as instructed.Please shower with 1/2 bottle the evening before your surgery and the other 1/2 bottle the morning of surgery. Neck down avoiding private area.

## 2012-03-31 ENCOUNTER — Inpatient Hospital Stay (HOSPITAL_COMMUNITY): Admission: RE | Admit: 2012-03-31 | Source: Ambulatory Visit

## 2012-04-02 ENCOUNTER — Inpatient Hospital Stay (HOSPITAL_COMMUNITY): Payer: Medicaid Other | Admitting: Anesthesiology

## 2012-04-02 ENCOUNTER — Inpatient Hospital Stay (HOSPITAL_COMMUNITY)
Admission: AD | Admit: 2012-04-02 | Discharge: 2012-04-05 | DRG: 766 | Disposition: A | Discharge status: Home / Self Care | Payer: Medicaid Other | Source: Ambulatory Visit | Attending: Obstetrics & Gynecology | Admitting: Obstetrics & Gynecology

## 2012-04-02 ENCOUNTER — Encounter (HOSPITAL_COMMUNITY): Payer: Self-pay | Admitting: *Deleted

## 2012-04-02 ENCOUNTER — Encounter (HOSPITAL_COMMUNITY)
Admission: AD | Disposition: A | Discharge status: Home / Self Care | Payer: Self-pay | Source: Ambulatory Visit | Attending: Obstetrics & Gynecology

## 2012-04-02 ENCOUNTER — Encounter (HOSPITAL_COMMUNITY): Payer: Self-pay | Admitting: Anesthesiology

## 2012-04-02 DIAGNOSIS — O34219 Maternal care for unspecified type scar from previous cesarean delivery: Principal | ICD-10-CM | POA: Diagnosis present

## 2012-04-02 SURGERY — Surgical Case
Anesthesia: Spinal | Site: Abdomen | Wound class: Clean Contaminated

## 2012-04-02 MED ORDER — LACTATED RINGERS IV SOLN
INTRAVENOUS | Status: DC
  Administered 2012-04-03: via INTRAVENOUS

## 2012-04-02 MED ORDER — OXYCODONE-ACETAMINOPHEN 5-325 MG PO TABS
1.0000 | ORAL_TABLET | ORAL | Status: DC | PRN
  Administered 2012-04-03 – 2012-04-04 (×8): 1 via ORAL
  Administered 2012-04-04: 2 via ORAL
  Administered 2012-04-05: 1 via ORAL
  Filled 2012-04-02: qty 1
  Filled 2012-04-02: qty 2
  Filled 2012-04-02 (×8): qty 1

## 2012-04-02 MED ORDER — NALBUPHINE SYRINGE 5 MG/0.5 ML
INJECTION | INTRAMUSCULAR | Status: AC
  Administered 2012-04-02: 5 mg via INTRAVENOUS
  Filled 2012-04-02: qty 1

## 2012-04-02 MED ORDER — MEPERIDINE HCL 25 MG/ML IJ SOLN: 6.2500 mg | INTRAMUSCULAR | Status: DC | PRN

## 2012-04-02 MED ORDER — FENTANYL CITRATE 0.05 MG/ML IJ SOLN
25.0000 ug | INTRAMUSCULAR | Status: DC | PRN
  Administered 2012-04-02 (×2): 50 ug via INTRAVENOUS

## 2012-04-02 MED ORDER — SIMETHICONE 80 MG PO CHEW
80.0000 mg | CHEWABLE_TABLET | ORAL | Status: DC | PRN
  Administered 2012-04-02 – 2012-04-03 (×3): 80 mg via ORAL

## 2012-04-02 MED ORDER — FENTANYL CITRATE 0.05 MG/ML IJ SOLN
INTRAMUSCULAR | Status: AC
  Administered 2012-04-02: 50 ug via INTRAVENOUS
  Filled 2012-04-02: qty 2

## 2012-04-02 MED ORDER — PHENYLEPHRINE HCL 10 MG/ML IJ SOLN
INTRAMUSCULAR | Status: DC | PRN
  Administered 2012-04-02 (×4): 40 ug via INTRAVENOUS
  Administered 2012-04-02 (×2): 80 ug via INTRAVENOUS
  Administered 2012-04-02: 40 ug via INTRAVENOUS
  Administered 2012-04-02: 80 ug via INTRAVENOUS
  Administered 2012-04-02 (×4): 40 ug via INTRAVENOUS

## 2012-04-02 MED ORDER — IBUPROFEN 600 MG PO TABS
600.0000 mg | ORAL_TABLET | Freq: Four times a day (QID) | ORAL | Status: DC | PRN
  Filled 2012-04-02 (×8): qty 1

## 2012-04-02 MED ORDER — OXYTOCIN 10 UNIT/ML IJ SOLN
INTRAMUSCULAR | Status: AC
  Filled 2012-04-02: qty 4

## 2012-04-02 MED ORDER — MEASLES, MUMPS & RUBELLA VAC ~~LOC~~ INJ: 0.5000 mL | INJECTION | Freq: Once | SUBCUTANEOUS | Status: DC

## 2012-04-02 MED ORDER — DIPHENHYDRAMINE HCL 50 MG/ML IJ SOLN: 25.0000 mg | INTRAMUSCULAR | Status: DC | PRN

## 2012-04-02 MED ORDER — OXYTOCIN 40 UNITS IN LACTATED RINGERS INFUSION - SIMPLE MED: 62.5000 mL/h | INTRAVENOUS | Status: AC

## 2012-04-02 MED ORDER — NALBUPHINE HCL 10 MG/ML IJ SOLN
5.0000 mg | INTRAMUSCULAR | Status: DC | PRN
  Administered 2012-04-02: 10 mg via SUBCUTANEOUS
  Filled 2012-04-02 (×2): qty 1

## 2012-04-02 MED ORDER — KETOROLAC TROMETHAMINE 30 MG/ML IJ SOLN: 30.0000 mg | Freq: Four times a day (QID) | INTRAMUSCULAR | Status: AC | PRN

## 2012-04-02 MED ORDER — TETANUS-DIPHTH-ACELL PERTUSSIS 5-2.5-18.5 LF-MCG/0.5 IM SUSP: 0.5000 mL | Freq: Once | INTRAMUSCULAR | Status: DC

## 2012-04-02 MED ORDER — ONDANSETRON HCL 4 MG/2ML IJ SOLN: 4.0000 mg | Freq: Three times a day (TID) | INTRAMUSCULAR | Status: DC | PRN

## 2012-04-02 MED ORDER — MAGNESIUM HYDROXIDE 400 MG/5ML PO SUSP: 30.0000 mL | ORAL | Status: DC | PRN

## 2012-04-02 MED ORDER — MEDROXYPROGESTERONE ACETATE 150 MG/ML IM SUSP: 150.0000 mg | INTRAMUSCULAR | Status: DC | PRN

## 2012-04-02 MED ORDER — SCOPOLAMINE 1 MG/3DAYS TD PT72
MEDICATED_PATCH | TRANSDERMAL | Status: AC
  Filled 2012-04-02: qty 1

## 2012-04-02 MED ORDER — ONDANSETRON HCL 4 MG/2ML IJ SOLN
INTRAMUSCULAR | Status: AC
  Filled 2012-04-02: qty 2

## 2012-04-02 MED ORDER — SODIUM CHLORIDE 0.9 % IR SOLN
Status: DC | PRN
  Administered 2012-04-02: 1000 mL

## 2012-04-02 MED ORDER — LACTATED RINGERS IV SOLN
INTRAVENOUS | Status: DC
  Administered 2012-04-02 (×6): via INTRAVENOUS

## 2012-04-02 MED ORDER — KETOROLAC TROMETHAMINE 60 MG/2ML IM SOLN
60.0000 mg | Freq: Once | INTRAMUSCULAR | Status: AC | PRN
  Administered 2012-04-02: 60 mg via INTRAMUSCULAR

## 2012-04-02 MED ORDER — IBUPROFEN 600 MG PO TABS
600.0000 mg | ORAL_TABLET | Freq: Four times a day (QID) | ORAL | Status: DC
  Administered 2012-04-03 – 2012-04-05 (×11): 600 mg via ORAL
  Filled 2012-04-02 (×3): qty 1

## 2012-04-02 MED ORDER — NALOXONE HCL 0.4 MG/ML IJ SOLN: 0.4000 mg | INTRAMUSCULAR | Status: DC | PRN

## 2012-04-02 MED ORDER — FERROUS SULFATE 325 (65 FE) MG PO TABS
325.0000 mg | ORAL_TABLET | Freq: Two times a day (BID) | ORAL | Status: DC
  Administered 2012-04-03 – 2012-04-05 (×5): 325 mg via ORAL
  Filled 2012-04-02 (×5): qty 1

## 2012-04-02 MED ORDER — SODIUM CHLORIDE 0.9 % IJ SOLN: 3.0000 mL | INTRAMUSCULAR | Status: DC | PRN

## 2012-04-02 MED ORDER — ONDANSETRON HCL 4 MG/2ML IJ SOLN: 4.0000 mg | INTRAMUSCULAR | Status: DC | PRN

## 2012-04-02 MED ORDER — METOCLOPRAMIDE HCL 5 MG/ML IJ SOLN: 10.0000 mg | Freq: Three times a day (TID) | INTRAMUSCULAR | Status: DC | PRN

## 2012-04-02 MED ORDER — EPHEDRINE 5 MG/ML INJ
INTRAVENOUS | Status: AC
  Filled 2012-04-02: qty 10

## 2012-04-02 MED ORDER — PHENYLEPHRINE 40 MCG/ML (10ML) SYRINGE FOR IV PUSH (FOR BLOOD PRESSURE SUPPORT)
PREFILLED_SYRINGE | INTRAVENOUS | Status: AC
  Filled 2012-04-02: qty 5

## 2012-04-02 MED ORDER — DIPHENHYDRAMINE HCL 25 MG PO CAPS
25.0000 mg | ORAL_CAPSULE | ORAL | Status: DC | PRN
  Administered 2012-04-03 (×3): 25 mg via ORAL
  Filled 2012-04-02 (×4): qty 1

## 2012-04-02 MED ORDER — LANOLIN HYDROUS EX OINT: 1.0000 "application " | TOPICAL_OINTMENT | CUTANEOUS | Status: DC | PRN

## 2012-04-02 MED ORDER — FENTANYL CITRATE 0.05 MG/ML IJ SOLN
INTRAMUSCULAR | Status: AC
  Filled 2012-04-02: qty 2

## 2012-04-02 MED ORDER — SCOPOLAMINE 1 MG/3DAYS TD PT72: 1.0000 | MEDICATED_PATCH | Freq: Once | TRANSDERMAL | Status: DC

## 2012-04-02 MED ORDER — OXYTOCIN 40 UNITS IN LACTATED RINGERS INFUSION - SIMPLE MED
INTRAVENOUS | Status: DC | PRN
  Administered 2012-04-02: 40 [IU] via INTRAVENOUS

## 2012-04-02 MED ORDER — DEXTROSE 5 % IV SOLN
3.0000 g | INTRAVENOUS | Status: AC
  Administered 2012-04-02: 3 g via INTRAVENOUS
  Filled 2012-04-02: qty 3000

## 2012-04-02 MED ORDER — DIPHENHYDRAMINE HCL 25 MG PO CAPS
25.0000 mg | ORAL_CAPSULE | Freq: Four times a day (QID) | ORAL | Status: DC | PRN
  Administered 2012-04-03: 25 mg via ORAL

## 2012-04-02 MED ORDER — KETOROLAC TROMETHAMINE 60 MG/2ML IM SOLN
INTRAMUSCULAR | Status: AC
  Administered 2012-04-02: 60 mg via INTRAMUSCULAR
  Filled 2012-04-02: qty 2

## 2012-04-02 MED ORDER — WITCH HAZEL-GLYCERIN EX PADS: 1.0000 "application " | MEDICATED_PAD | CUTANEOUS | Status: DC | PRN

## 2012-04-02 MED ORDER — DIPHENHYDRAMINE HCL 50 MG/ML IJ SOLN: 12.5000 mg | INTRAMUSCULAR | Status: DC | PRN

## 2012-04-02 MED ORDER — ONDANSETRON HCL 4 MG/2ML IJ SOLN
INTRAMUSCULAR | Status: DC | PRN
  Administered 2012-04-02: 4 mg via INTRAVENOUS

## 2012-04-02 MED ORDER — NALBUPHINE HCL 10 MG/ML IJ SOLN
5.0000 mg | INTRAMUSCULAR | Status: DC | PRN
  Administered 2012-04-02: 5 mg via INTRAVENOUS
  Filled 2012-04-02: qty 1

## 2012-04-02 MED ORDER — SODIUM CHLORIDE 0.9 % IV SOLN
1.0000 ug/kg/h | INTRAVENOUS | Status: DC | PRN
  Filled 2012-04-02: qty 2.5

## 2012-04-02 MED ORDER — PRENATAL MULTIVITAMIN CH
1.0000 | ORAL_TABLET | Freq: Every day | ORAL | Status: DC
  Administered 2012-04-03 – 2012-04-04 (×2): 1 via ORAL
  Filled 2012-04-02 (×3): qty 1

## 2012-04-02 MED ORDER — SCOPOLAMINE 1 MG/3DAYS TD PT72
1.0000 | MEDICATED_PATCH | Freq: Once | TRANSDERMAL | Status: DC
Start: 2012-04-02 — End: 2012-04-02
  Administered 2012-04-02: 1.5 mg via TRANSDERMAL

## 2012-04-02 MED ORDER — ONDANSETRON HCL 4 MG PO TABS: 4.0000 mg | ORAL_TABLET | ORAL | Status: DC | PRN

## 2012-04-02 MED ORDER — MORPHINE SULFATE 0.5 MG/ML IJ SOLN
INTRAMUSCULAR | Status: AC
  Filled 2012-04-02: qty 10

## 2012-04-02 MED ORDER — DIBUCAINE 1 % RE OINT: 1.0000 "application " | TOPICAL_OINTMENT | RECTAL | Status: DC | PRN

## 2012-04-02 MED ORDER — SENNOSIDES-DOCUSATE SODIUM 8.6-50 MG PO TABS
2.0000 | ORAL_TABLET | Freq: Every day | ORAL | Status: DC
  Administered 2012-04-02 – 2012-04-04 (×3): 2 via ORAL

## 2012-04-02 MED ORDER — ZOLPIDEM TARTRATE 5 MG PO TABS: 5.0000 mg | ORAL_TABLET | Freq: Every evening | ORAL | Status: DC | PRN

## 2012-04-02 SURGICAL SUPPLY — 42 items
BENZOIN TINCTURE PRP APPL 2/3 (GAUZE/BANDAGES/DRESSINGS) ×2 IMPLANT
CANISTER WOUND CARE 500ML ATS (WOUND CARE) IMPLANT
CHLORAPREP W/TINT 26ML (MISCELLANEOUS) ×2 IMPLANT
CLOTH BEACON ORANGE TIMEOUT ST (SAFETY) ×2 IMPLANT
CONTAINER PREFILL 10% NBF 15ML (MISCELLANEOUS) IMPLANT
DRESSING TELFA 8X3 (GAUZE/BANDAGES/DRESSINGS) ×2 IMPLANT
DRSG COVADERM 4X10 (GAUZE/BANDAGES/DRESSINGS) ×2 IMPLANT
DRSG VAC ATS LRG SENSATRAC (GAUZE/BANDAGES/DRESSINGS) IMPLANT
DRSG VAC ATS MED SENSATRAC (GAUZE/BANDAGES/DRESSINGS) IMPLANT
DRSG VAC ATS SM SENSATRAC (GAUZE/BANDAGES/DRESSINGS) IMPLANT
ELECT REM PT RETURN 9FT ADLT (ELECTROSURGICAL) ×2
ELECTRODE REM PT RTRN 9FT ADLT (ELECTROSURGICAL) ×1 IMPLANT
EXTRACTOR VACUUM M CUP 4 TUBE (SUCTIONS) IMPLANT
GAUZE SPONGE 4X4 12PLY STRL LF (GAUZE/BANDAGES/DRESSINGS) IMPLANT
GLOVE BIO SURGEON STRL SZ 6.5 (GLOVE) ×4 IMPLANT
GOWN PREVENTION PLUS LG XLONG (DISPOSABLE) ×6 IMPLANT
KIT ABG SYR 3ML LUER SLIP (SYRINGE) IMPLANT
NEEDLE HYPO 25X5/8 SAFETYGLIDE (NEEDLE) IMPLANT
NS IRRIG 1000ML POUR BTL (IV SOLUTION) ×2 IMPLANT
PACK C SECTION WH (CUSTOM PROCEDURE TRAY) ×2 IMPLANT
PAD ABD 7.5X8 STRL (GAUZE/BANDAGES/DRESSINGS) ×2 IMPLANT
PAD OB MATERNITY 4.3X12.25 (PERSONAL CARE ITEMS) IMPLANT
RTRCTR C-SECT PINK 25CM LRG (MISCELLANEOUS) IMPLANT
RTRCTR C-SECT PINK 34CM XLRG (MISCELLANEOUS) IMPLANT
SLEEVE SCD COMPRESS KNEE MED (MISCELLANEOUS) IMPLANT
STAPLER VISISTAT 35W (STAPLE) IMPLANT
STRIP CLOSURE SKIN 1/2X4 (GAUZE/BANDAGES/DRESSINGS) ×2 IMPLANT
SUT MNCRL 0 VIOLET CTX 36 (SUTURE) ×2 IMPLANT
SUT MNCRL AB 3-0 PS2 27 (SUTURE) ×2 IMPLANT
SUT MONOCRYL 0 CTX 36 (SUTURE) ×2
SUT PDS AB 0 CTX 36 PDP370T (SUTURE) ×4 IMPLANT
SUT PLAIN 0 NONE (SUTURE) IMPLANT
SUT VIC AB 0 CTXB 36 (SUTURE) IMPLANT
SUT VIC AB 2-0 CT1 (SUTURE) ×4 IMPLANT
SUT VIC AB 2-0 CT1 27 (SUTURE) ×1
SUT VIC AB 2-0 CT1 TAPERPNT 27 (SUTURE) ×1 IMPLANT
SUT VIC AB 2-0 SH 27 (SUTURE) ×1
SUT VIC AB 2-0 SH 27XBRD (SUTURE) ×1 IMPLANT
TAPE CLOTH SURG 4X10 WHT LF (GAUZE/BANDAGES/DRESSINGS) ×2 IMPLANT
TOWEL OR 17X24 6PK STRL BLUE (TOWEL DISPOSABLE) ×4 IMPLANT
TRAY FOLEY CATH 14FR (SET/KITS/TRAYS/PACK) ×2 IMPLANT
WATER STERILE IRR 1000ML POUR (IV SOLUTION) ×2 IMPLANT

## 2012-04-02 NOTE — H&P (Signed)
Ana Pace is a 31 y.o. female presenting for a scheduled C/D. Maternal Medical History:  Reason for admission: Presents for an elective, repeat C/D.  Fetal activity: Perceived fetal activity is normal.    Prenatal complications: no prenatal complications   OB History    Grav Para Term Preterm Abortions TAB SAB Ect Mult Living   3 2        2      Past Medical History  Diagnosis Date  . Abnormal Pap smear   . Endometrial hyperplasia without atypia, simple   . GERD (gastroesophageal reflux disease)     tums prn   Past Surgical History  Procedure Date  . Cesarean section 2000, 2004  . Diagnostic laparoscopy     endometriosis   Family History: family history includes Hypertension in her mother. Social History:  reports that she quit smoking about 4 years ago. Her smoking use included Cigarettes. She quit after 3 years of use. She has never used smokeless tobacco. She reports that she does not drink alcohol or use illicit drugs.     Review of Systems  Constitutional: Negative for fever.  Eyes: Negative for blurred vision.  Respiratory: Negative for shortness of breath.   Gastrointestinal: Negative for vomiting.  Skin: Negative for rash.  Neurological: Negative for headaches.      Last menstrual period 06/07/2011. Maternal Exam:  Abdomen: not evaluated.  Introitus: not evaluated.   Cervix: not evaluated.   Fetal Exam Fetal Monitor Review: Mode: hand-held doppler probe.       Physical Exam  Constitutional: She appears well-developed.  HENT:  Head: Normocephalic.  Neck: Neck supple. No thyromegaly present.  Cardiovascular: Normal rate and regular rhythm.   Respiratory: Breath sounds normal.  GI: Soft. Bowel sounds are normal.  Skin: No rash noted.    Prenatal labs: ABO, Rh: --/--/O POS (08/16 0901) Antibody: NEG (08/16 0901) Rubella:   RPR: NON REACTIVE (08/16 0901)  HBsAg:    HIV:    GBS:     Assessment/Plan: Multipara with an IUP @ [redacted]w[redacted]d.   H/O previous C/D X 2.  Admit Repeat C/D   JACKSON-MOORE,Preslei Blakley A 04/02/2012, 11:47 AM

## 2012-04-02 NOTE — Op Note (Signed)
Cesarean Section Procedure Note   Ana Pace   04/02/2012  Indications: Scheduled Proceedure/Maternal Request   Pre-operative Diagnosis: Previous cesarean section x 2; IUP @ [redacted]w[redacted]d   .   Post-operative Diagnosis: Same   Surgeon: Antionette Char A  Assistants: none  Anesthesia: spinal  Procedure Details:  The patient was seen in the Holding Room. The risks, benefits, complications, treatment options, and expected outcomes were discussed with the patient. The patient concurred with the proposed plan, giving informed consent. The patient was identified as [name] and the procedure verified as C-Section Delivery. A Time Out was held and the above information confirmed.  After induction of anesthesia, the patient was draped and prepped in the usual sterile manner. A transverse incision was made and carried down through the subcutaneous tissue to the fascia. The fascial incision was made and extended transversely. The fascia was separated from the underlying rectus tissue superiorly and inferiorly. The peritoneum was identified and entered. The peritoneal incision was extended longitudinally. The utero-vesical peritoneal reflection was incised transversely and the bladder flap was sharply freed from the lower uterine segment. A low transverse uterine incision was made. Delivered from cephalic presentation with vacuum assistance was [place] newborn female infant. A cord ph was not sent. The umbilical cord was clamped and cut cord. A sample was obtained for evaluation. The placenta was removed Intact and appeared normal.  The uterine incision was closed with running locked sutures of 1-0 Monocryl. A second imbricating layer of the same suture was placed.  Hemostasis was observed. The paracolic gutters were irrigated. A small tear was noted in bladder serosa at the dome.  The muscularis was intact.  This was over sewn with a running suture of 2-0 Vicryl.  The parieto peritoneum was  closed in a running fashion with 2-0 Vicryl.  The fascia was then reapproximated with running sutures of 1-0 PDS. The subcuticular closure was performed using 3-0 Monocryl.  Instrument, sponge, and needle counts were correct prior the abdominal closure and were correct at the conclusion of the case.    Findings:  Uterine serosal adhesions involving the bladder flap peritoneum.   Estimated Blood Loss: 600 ml  Total IV Fluids: per Anesthesiology  Urine Output: per Anesthesiology  Specimens: none  Complications: see above  Disposition: PACU - hemodynamically stable.  Maternal Condition: stable   Baby condition / location:  nursery-stable    Signed: Surgeon(s): Antionette Char, MD

## 2012-04-02 NOTE — Anesthesia Postprocedure Evaluation (Signed)
  Anesthesia Post-op Note  Patient: Ana Pace  Procedure(s) Performed: Procedure(s) (LRB): CESAREAN SECTION (N/A)  Patient is awake, responsive, moving her legs, and has signs of resolution of her numbness. Pain and nausea are reasonably well controlled. Vital signs are stable and clinically acceptable. Oxygen saturation is clinically acceptable. There are no apparent anesthetic complications at this time. Patient is ready for discharge.

## 2012-04-02 NOTE — Anesthesia Preprocedure Evaluation (Signed)
Anesthesia Evaluation  Patient identified by MRN, date of birth, ID band Patient awake    Reviewed: Allergy & Precautions, H&P , NPO status , Patient's Chart, lab work & pertinent test results, reviewed documented beta blocker date and time   History of Anesthesia Complications Negative for: history of anesthetic complications  Airway Mallampati: II TM Distance: >3 FB Neck ROM: full    Dental  (+) Teeth Intact   Pulmonary neg pulmonary ROS,  breath sounds clear to auscultation        Cardiovascular negative cardio ROS  Rhythm:regular Rate:Normal     Neuro/Psych negative neurological ROS  negative psych ROS   GI/Hepatic Neg liver ROS, GERD- (pregnancy related, tums prn)  ,  Endo/Other  negative endocrine ROS  Renal/GU negative Renal ROS  negative genitourinary   Musculoskeletal   Abdominal   Peds  Hematology negative hematology ROS (+)   Anesthesia Other Findings   Reproductive/Obstetrics (+) Pregnancy (h/o c/s x2, for repeat)                           Anesthesia Physical Anesthesia Plan  ASA: II  Anesthesia Plan: Spinal   Post-op Pain Management:    Induction:   Airway Management Planned:   Additional Equipment:   Intra-op Plan:   Post-operative Plan:   Informed Consent: I have reviewed the patients History and Physical, chart, labs and discussed the procedure including the risks, benefits and alternatives for the proposed anesthesia with the patient or authorized representative who has indicated his/her understanding and acceptance.     Plan Discussed with: Surgeon and CRNA  Anesthesia Plan Comments:         Anesthesia Quick Evaluation

## 2012-04-02 NOTE — Transfer of Care (Signed)
Immediate Anesthesia Transfer of Care Note  Patient: Ana Pace  Procedure(s) Performed: Procedure(s) (LRB): CESAREAN SECTION (N/A)  Patient Location: PACU  Anesthesia Type: Spinal  Level of Consciousness: awake, alert  and oriented  Airway & Oxygen Therapy: Patient Spontanous Breathing  Post-op Assessment: Report given to PACU RN and Post -op Vital signs reviewed and stable  Post vital signs: Reviewed and stable  Complications: No apparent anesthesia complications

## 2012-04-02 NOTE — Anesthesia Procedure Notes (Signed)
Spinal  Patient location during procedure: OR Preanesthetic Checklist Completed: patient identified, site marked, surgical consent, pre-op evaluation, timeout performed, IV checked, risks and benefits discussed and monitors and equipment checked Spinal Block Patient position: sitting Prep: DuraPrep Patient monitoring: heart rate, cardiac monitor, continuous pulse ox and blood pressure Approach: midline Location: L3-4 Injection technique: single-shot Needle Needle type: Sprotte  Needle gauge: 24 G Needle length: 9 cm Assessment Sensory level: T4 Events: paresthesia Additional Notes Transient parasthesia; L leg Spinal Dosage in OR  Bupivicaine ml       1.8 PFMS04   mcg        150 Fentanyl mcg            25

## 2012-04-02 NOTE — OR Nursing (Addendum)
Uterus massaged by S. Lealon Vanputten Charity fundraiser.  Two tubes of cord blood to lab.  15cc of blood evacuated from uterus during uterine massage.

## 2012-04-03 ENCOUNTER — Encounter (HOSPITAL_COMMUNITY): Payer: Self-pay | Admitting: Obstetrics & Gynecology

## 2012-04-03 LAB — BIRTH TISSUE RECOVERY COLLECTION (PLACENTA DONATION)

## 2012-04-03 LAB — HEMOGLOBIN: Hemoglobin: 10.4 g/dL — ABNORMAL LOW (ref 12.0–15.0)

## 2012-04-03 NOTE — Progress Notes (Signed)
UR Chart review completed.  

## 2012-04-03 NOTE — Addendum Note (Signed)
Addendum  created 04/03/12 1314 by Randa Spike, CRNA   Modules edited:Notes Section

## 2012-04-03 NOTE — Progress Notes (Signed)
Subjective: Postpartum Day 1: Cesarean Delivery Patient reports incisional pain, tolerating PO, + flatus and no problems voiding.    Objective: Vital signs in last 24 hours: Temp:  [97.9 F (36.6 C)-98.7 F (37.1 C)] 98.4 F (36.9 C) (08/20 0445) Pulse Rate:  [67-99] 76  (08/20 0445) Resp:  [16-20] 16  (08/20 0445) BP: (106-125)/(52-78) 114/72 mmHg (08/20 0445) SpO2:  [97 %-100 %] 98 % (08/20 0445) Weight:  [118.842 kg (262 lb)] 118.842 kg (262 lb) (08/19 1844)  Physical Exam:  General: alert and no distress Lochia: appropriate Uterine Fundus: firm Incision: healing well DVT Evaluation: No evidence of DVT seen on physical exam.   Basename 04/03/12 0535  HGB 10.4*  HCT --    Assessment/Plan: Status post Cesarean section. Doing well postoperatively.  Continue current care.  HARPER,CHARLES A 04/03/2012, 8:39 AM

## 2012-04-03 NOTE — Anesthesia Postprocedure Evaluation (Signed)
Alert oriented denies residual numbness, headache or back pain. No complaints voiced

## 2012-04-04 NOTE — Progress Notes (Signed)
Subjective: Postpartum Day 2: Cesarean Delivery Patient reports tolerating PO, + flatus and no problems voiding.    Objective: Vital signs in last 24 hours: Temp:  [98 F (36.7 C)-99 F (37.2 C)] 98.3 F (36.8 C) (08/21 0600) Pulse Rate:  [74-94] 83  (08/21 0600) Resp:  [18-20] 18  (08/21 0600) BP: (98-117)/(61-74) 101/62 mmHg (08/21 0600) SpO2:  [96 %] 96 % (08/20 1300)  Physical Exam:  General: alert and no distress Lochia: appropriate Uterine Fundus: firm Incision: healing well DVT Evaluation: No evidence of DVT seen on physical exam.   Basename 04/03/12 0535  HGB 10.4*  HCT --    Assessment/Plan: Status post Cesarean section. Doing well postoperatively.  Continue current care.  HARPER,CHARLES A 04/04/2012, 8:45 AM

## 2012-04-05 LAB — TYPE AND SCREEN
ABO/RH(D): O POS
Unit division: 0
Unit division: 0

## 2012-04-05 MED ORDER — IBUPROFEN 600 MG PO TABS: 600.0000 mg | ORAL_TABLET | Freq: Four times a day (QID) | ORAL | Status: DC

## 2012-04-05 MED ORDER — OXYCODONE-ACETAMINOPHEN 5-325 MG PO TABS: 1.0000 | ORAL_TABLET | ORAL | Status: AC | PRN

## 2012-04-05 NOTE — Discharge Summary (Signed)
Obstetric Discharge Summary Reason for Admission: cesarean section Prenatal Procedures: ultrasound Intrapartum Procedures: cesarean: low cervical, transverse Postpartum Procedures: none Complications-Operative and Postpartum: none Hemoglobin  Date Value Range Status  04/03/2012 10.4* 12.0 - 15.0 g/dL Final     HCT  Date Value Range Status  03/30/2012 40.7  36.0 - 46.0 % Final    Physical Exam:  General: alert and no distress Lochia: appropriate Uterine Fundus: firm Incision: healing well DVT Evaluation: No evidence of DVT seen on physical exam.  Discharge Diagnoses: Term Pregnancy-delivered  Discharge Information: Date: 04/05/2012 Activity: pelvic rest Diet: routine Medications: PNV, Ibuprofen, Colace and Percocet Condition: stable Instructions: refer to practice specific booklet Discharge to: home Follow-up Information    Follow up with Antionette Char A, MD in 2 weeks.   Contact information:   [address], [address] [place] [address] [phone]          Newborn Data: Live born female  Birth Weight: 7 lb 9.3 oz (3440 g) APGAR: 8, 9  Home with mother.  HARPER,CHARLES A 04/05/2012, 9:46 AM

## 2012-04-05 NOTE — Progress Notes (Signed)
Subjective: Postpartum Day 3: Cesarean Delivery Patient reports tolerating PO, + flatus, + BM and no problems voiding.    Objective: Vital signs in last 24 hours: Temp:  [98.4 F (36.9 C)-98.9 F (37.2 C)] 98.9 F (37.2 C) (08/22 0500) Pulse Rate:  [89-100] 100  (08/22 0500) Resp:  [18-19] 18  (08/22 0500) BP: (105-120)/(71-74) 120/74 mmHg (08/22 0500)  Physical Exam:  General: alert and no distress Lochia: appropriate Uterine Fundus: firm Incision: healing well DVT Evaluation: No evidence of DVT seen on physical exam.   Basename 04/03/12 0535  HGB 10.4*  HCT --    Assessment/Plan: Status post Cesarean section. Doing well postoperatively.  Discharge home with standard precautions and return to clinic in 2 weeks.  Kein Carlberg A 04/05/2012, 9:43 AM

## 2012-12-06 ENCOUNTER — Encounter (HOSPITAL_COMMUNITY): Payer: Self-pay | Admitting: *Deleted

## 2012-12-06 ENCOUNTER — Emergency Department (HOSPITAL_COMMUNITY)
Admission: EM | Admit: 2012-12-06 | Discharge: 2012-12-07 | Disposition: A | Discharge status: Home / Self Care | Payer: No Typology Code available for payment source | Attending: Emergency Medicine | Admitting: Emergency Medicine

## 2012-12-06 ENCOUNTER — Emergency Department (HOSPITAL_COMMUNITY): Payer: No Typology Code available for payment source

## 2012-12-06 DIAGNOSIS — Z8719 Personal history of other diseases of the digestive system: Secondary | ICD-10-CM | POA: Insufficient documentation

## 2012-12-06 DIAGNOSIS — Y92410 Unspecified street and highway as the place of occurrence of the external cause: Secondary | ICD-10-CM | POA: Insufficient documentation

## 2012-12-06 DIAGNOSIS — Y9389 Activity, other specified: Secondary | ICD-10-CM | POA: Insufficient documentation

## 2012-12-06 DIAGNOSIS — S0993XA Unspecified injury of face, initial encounter: Secondary | ICD-10-CM | POA: Insufficient documentation

## 2012-12-06 DIAGNOSIS — Z8742 Personal history of other diseases of the female genital tract: Secondary | ICD-10-CM | POA: Insufficient documentation

## 2012-12-06 DIAGNOSIS — Z87891 Personal history of nicotine dependence: Secondary | ICD-10-CM | POA: Insufficient documentation

## 2012-12-06 MED ORDER — METHOCARBAMOL 500 MG PO TABS
1000.0000 mg | ORAL_TABLET | Freq: Once | ORAL | Status: AC
  Administered 2012-12-06: 1000 mg via ORAL
  Filled 2012-12-06: qty 2

## 2012-12-06 MED ORDER — HYDROCODONE-ACETAMINOPHEN 5-325 MG PO TABS
1.0000 | ORAL_TABLET | Freq: Once | ORAL | Status: AC
  Administered 2012-12-06: 1 via ORAL
  Filled 2012-12-06: qty 1
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
## 2012-12-07 MED ORDER — METHOCARBAMOL 500 MG PO TABS: 1000.0000 mg | ORAL_TABLET | Freq: Four times a day (QID) | ORAL | Status: DC

## 2012-12-07 MED ORDER — IBUPROFEN 600 MG PO TABS: 600.0000 mg | ORAL_TABLET | Freq: Four times a day (QID) | ORAL | Status: DC | PRN

## 2012-12-07 MED ORDER — KETOROLAC TROMETHAMINE 30 MG/ML IJ SOLN
30.0000 mg | Freq: Once | INTRAMUSCULAR | Status: AC
  Administered 2012-12-07: 30 mg via INTRAMUSCULAR
  Filled 2012-12-07: qty 1

## 2012-12-07 MED ORDER — HYDROCODONE-ACETAMINOPHEN 5-325 MG PO TABS: 1.0000 | ORAL_TABLET | ORAL | Status: DC | PRN
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
## 2012-12-11 NOTE — ED Provider Notes (Signed)
Medical screening examination/treatment/procedure(s) were performed by non-physician practitioner and as supervising physician I was immediately available for consultation/collaboration. Devoria Albe, MD, Armando Gang   Ward Givens, MD 12/11/12 [phone]

## 2013-06-03 ENCOUNTER — Emergency Department (HOSPITAL_COMMUNITY)
Admission: EM | Admit: 2013-06-03 | Discharge: 2013-06-04 | Disposition: A | Discharge status: Home / Self Care | Payer: Self-pay | Attending: Emergency Medicine | Admitting: Emergency Medicine

## 2013-06-03 DIAGNOSIS — R112 Nausea with vomiting, unspecified: Secondary | ICD-10-CM | POA: Insufficient documentation

## 2013-06-03 DIAGNOSIS — H53149 Visual discomfort, unspecified: Secondary | ICD-10-CM | POA: Insufficient documentation

## 2013-06-03 DIAGNOSIS — G43909 Migraine, unspecified, not intractable, without status migrainosus: Secondary | ICD-10-CM | POA: Insufficient documentation

## 2013-06-03 DIAGNOSIS — Z87891 Personal history of nicotine dependence: Secondary | ICD-10-CM | POA: Insufficient documentation

## 2013-06-03 MED ORDER — METOCLOPRAMIDE HCL 5 MG/ML IJ SOLN
5.0000 mg | Freq: Once | INTRAMUSCULAR | Status: AC
  Administered 2013-06-03: 5 mg via INTRAVENOUS
  Filled 2013-06-03: qty 2

## 2013-06-03 MED ORDER — DIPHENHYDRAMINE HCL 50 MG/ML IJ SOLN
25.0000 mg | Freq: Once | INTRAMUSCULAR | Status: AC
  Administered 2013-06-03: 25 mg via INTRAVENOUS
  Filled 2013-06-03: qty 1

## 2013-06-03 MED ORDER — SODIUM CHLORIDE 0.9 % IV BOLUS (SEPSIS)
1000.0000 mL | Freq: Once | INTRAVENOUS | Status: AC
  Administered 2013-06-03: 1000 mL via INTRAVENOUS

## 2013-06-03 MED ORDER — SODIUM CHLORIDE 0.9 % IV BOLUS (SEPSIS)
500.0000 mL | Freq: Once | INTRAVENOUS | Status: AC
  Administered 2013-06-03: 500 mL via INTRAVENOUS

## 2013-06-03 MED ORDER — MAGNESIUM SULFATE IN D5W 10-5 MG/ML-% IV SOLN
1.0000 g | Freq: Once | INTRAVENOUS | Status: AC
  Administered 2013-06-03: 1 g via INTRAVENOUS
  Filled 2013-06-03: qty 100

## 2013-06-03 MED ORDER — KETOROLAC TROMETHAMINE 30 MG/ML IJ SOLN
30.0000 mg | Freq: Once | INTRAMUSCULAR | Status: AC
  Administered 2013-06-03: 30 mg via INTRAVENOUS
  Filled 2013-06-03: qty 1

## 2013-06-03 NOTE — ED Notes (Signed)
Pt comes in with history of migranes and states that it started yesterday around 4[pm and she has been having nausea and vomiting and a decreased apetite. Took her fiorcet but has not had any relief.

## 2013-06-07 NOTE — ED Provider Notes (Signed)
CSN: [id number]     Arrival date & time 06/03/13  1818 History   First MD Initiated Contact with Patient 06/03/13 1921     Chief Complaint  Patient presents with  . Migraine   (Consider location/radiation/quality/duration/timing/severity/associated sxs/prior Treatment) HPI  Patient with history of migraines presents with HA.  ONset yesterday.  No improvement with FIorcet.  Patient reports frontal headache with photophobia.  Endorses n/v but states that she sometimes gets these with migraines.  Denies fever or neck pain.  Denies worst HA of life.  Denies weakness, numbness or tingling.  Past Medical History  Diagnosis Date  . Abnormal Pap smear   . Endometrial hyperplasia without atypia, simple   . GERD (gastroesophageal reflux disease)     tums prn   Past Surgical History  Procedure Laterality Date  . Cesarean section  2000, 2004  . Diagnostic laparoscopy      endometriosis  . Cesarean section  04/02/2012    Procedure: CESAREAN SECTION;  Surgeon: Antionette Char, MD;  Location: [facility];  Service: Obstetrics;  Laterality: N/A;  Repeat cesarean section with delivery of baby girl at 79.   Family History  Problem Relation Age of Onset  . Hypertension Mother    History  Substance Use Topics  . Smoking status: Former Smoker -- 3 years    Types: Cigarettes    Quit date: 09/05/2007  . Smokeless tobacco: Never Used  . Alcohol Use: No   OB History   Grav Para Term Preterm Abortions TAB SAB Ect Mult Living   3 3 1       3      Review of Systems  Constitutional: Negative for fever.  Eyes: Negative for visual disturbance.  Gastrointestinal: Positive for nausea and vomiting.  Neurological: Positive for headaches. Negative for dizziness, weakness and numbness.    Allergies  Review of patient's allergies indicates no known allergies.  Home Medications   Current Outpatient Rx  Name  Route  Sig  Dispense  Refill  . butalbital-acetaminophen-caffeine (FIORICET, ESGIC)  50-325-40 MG per tablet   Oral   Take 1 tablet by mouth every 4 (four) hours as needed for headache.         . ibuprofen (ADVIL,MOTRIN) 200 MG tablet   Oral   Take 800 mg by mouth daily as needed for pain.          BP 98/51  Pulse 77  Temp(Src) 98.9 F (37.2 C) (Oral)  Resp 16  Wt 206 lb (93.441 kg)  BMI 31.33 kg/m2  SpO2 98% Physical Exam  Nursing note and vitals reviewed. Constitutional: She is oriented to person, place, and time. She appears well-developed and well-nourished.  HENT:  Head: Normocephalic and atraumatic.  Eyes: Pupils are equal, round, and reactive to light.  Neck: Neck supple.  Cardiovascular: Normal rate, regular rhythm and normal heart sounds.   Pulmonary/Chest: Effort normal and breath sounds normal. No respiratory distress.  Abdominal: Soft. There is no tenderness.  Neurological: She is alert and oriented to person, place, and time. No cranial nerve deficit. Coordination normal.  Skin: Skin is warm and dry.  Psychiatric: She has a normal mood and affect.    ED Course  Procedures (including critical care time) Labs Review Labs Reviewed - No data to display Imaging Review No results found.  EKG Interpretation   None      Medications  sodium chloride 0.9 % bolus 1,000 mL (0 mLs Intravenous Stopped 06/03/13 2201)  metoCLOPramide (REGLAN) injection 5 mg (  5 mg Intravenous Given 06/03/13 1954)  diphenhydrAMINE (BENADRYL) injection 25 mg (25 mg Intravenous Given 06/03/13 1954)  ketorolac (TORADOL) 30 MG/ML injection 30 mg (30 mg Intravenous Given 06/03/13 1954)  magnesium sulfate IVPB 1 g 100 mL (0 g Intravenous Stopped 06/03/13 2244)  sodium chloride 0.9 % bolus 500 mL (0 mLs Intravenous Stopped 06/03/13 2327)     MDM   1. Migraine    Patient presents with migraine.  Denies fever, neck pain.  Neurologically exam is benign.  Low suspicion for [facility] or meningitis.  GIven reglan, benadryl and toradol.  ON recheck, patient reports improvement of  symptoms but still 3/10 pain.  Given magnesium.  Patient reports complete resolution of symptoms.  COntinues to have benign exam.  Will be discharged home.  After history, exam, and medical workup I feel the patient has been appropriately medically screened and is safe for discharge home. Pertinent diagnoses were discussed with the patient. Patient was given return precautions.   Shon Baton, MD 06/07/13 [phone]

## 2013-07-01 ENCOUNTER — Emergency Department (HOSPITAL_COMMUNITY)
Admission: EM | Admit: 2013-07-01 | Discharge: 2013-07-02 | Disposition: A | Discharge status: Home / Self Care | Payer: Self-pay | Attending: Emergency Medicine | Admitting: Emergency Medicine

## 2013-07-01 ENCOUNTER — Encounter (HOSPITAL_COMMUNITY): Payer: Self-pay | Admitting: Emergency Medicine

## 2013-07-01 ENCOUNTER — Emergency Department (HOSPITAL_COMMUNITY): Payer: Self-pay

## 2013-07-01 DIAGNOSIS — R109 Unspecified abdominal pain: Secondary | ICD-10-CM

## 2013-07-01 DIAGNOSIS — Z8719 Personal history of other diseases of the digestive system: Secondary | ICD-10-CM | POA: Insufficient documentation

## 2013-07-01 DIAGNOSIS — R1011 Right upper quadrant pain: Secondary | ICD-10-CM | POA: Insufficient documentation

## 2013-07-01 DIAGNOSIS — Z87891 Personal history of nicotine dependence: Secondary | ICD-10-CM | POA: Insufficient documentation

## 2013-07-01 DIAGNOSIS — R110 Nausea: Secondary | ICD-10-CM | POA: Insufficient documentation

## 2013-07-01 DIAGNOSIS — Z3202 Encounter for pregnancy test, result negative: Secondary | ICD-10-CM | POA: Insufficient documentation

## 2013-07-01 DIAGNOSIS — Z8742 Personal history of other diseases of the female genital tract: Secondary | ICD-10-CM | POA: Insufficient documentation

## 2013-07-01 DIAGNOSIS — R197 Diarrhea, unspecified: Secondary | ICD-10-CM | POA: Insufficient documentation

## 2013-07-01 LAB — URINALYSIS, ROUTINE W REFLEX MICROSCOPIC
Bilirubin Urine: NEGATIVE
Hgb urine dipstick: NEGATIVE
Ketones, ur: NEGATIVE mg/dL
Protein, ur: NEGATIVE mg/dL
Urobilinogen, UA: 1 mg/dL (ref 0.0–1.0)

## 2013-07-01 LAB — CBC WITH DIFFERENTIAL/PLATELET
Eosinophils Relative: 1 % (ref 0–5)
Hemoglobin: 12 g/dL (ref 12.0–15.0)
Lymphocytes Relative: 26 % (ref 12–46)
Lymphs Abs: 2.8 10*3/uL (ref 0.7–4.0)
MCV: 87.7 fL (ref 78.0–100.0)
Monocytes Relative: 7 % (ref 3–12)
Platelets: 286 10*3/uL (ref 150–400)
RBC: 3.97 MIL/uL (ref 3.87–5.11)
WBC: 10.7 10*3/uL — ABNORMAL HIGH (ref 4.0–10.5)

## 2013-07-01 LAB — COMPREHENSIVE METABOLIC PANEL
ALT: 11 U/L (ref 0–35)
Alkaline Phosphatase: 94 U/L (ref 39–117)
CO2: 27 mEq/L (ref 19–32)
GFR calc Af Amer: >90 mL/min (ref >90)
GFR calc non Af Amer: >90 mL/min (ref >90)
Glucose, Bld: 81 mg/dL (ref 70–99)
Potassium: 3.6 mEq/L (ref 3.5–5.1)
Sodium: 137 mEq/L (ref 135–145)

## 2013-07-01 MED ORDER — ONDANSETRON HCL 4 MG/2ML IJ SOLN
4.0000 mg | Freq: Once | INTRAMUSCULAR | Status: AC
  Administered 2013-07-01: 4 mg via INTRAVENOUS
  Filled 2013-07-01: qty 2

## 2013-07-01 MED ORDER — SODIUM CHLORIDE 0.9 % IV BOLUS (SEPSIS)
1000.0000 mL | Freq: Once | INTRAVENOUS | Status: AC
  Administered 2013-07-01: 1000 mL via INTRAVENOUS

## 2013-07-01 MED ORDER — HYDROMORPHONE HCL PF 1 MG/ML IJ SOLN
1.0000 mg | Freq: Once | INTRAMUSCULAR | Status: AC
  Administered 2013-07-01: 1 mg via INTRAVENOUS
  Filled 2013-07-01: qty 1

## 2013-07-01 MED ORDER — ONDANSETRON 8 MG PO TBDP
8.0000 mg | ORAL_TABLET | Freq: Once | ORAL | Status: AC
  Administered 2013-07-01: 8 mg via ORAL
  Filled 2013-07-01: qty 1

## 2013-07-01 NOTE — ED Notes (Signed)
Pt complains of right upper abd pain for 4 days, associated with meals, pt states that she feels very nauseated and severe pains after eating

## 2013-07-02 MED ORDER — FAMOTIDINE 20 MG PO TABS: 20.0000 mg | ORAL_TABLET | Freq: Two times a day (BID) | ORAL | Status: DC

## 2013-07-02 MED ORDER — GI COCKTAIL ~~LOC~~
30.0000 mL | Freq: Once | ORAL | Status: AC
  Administered 2013-07-02: 30 mL via ORAL
  Filled 2013-07-02: qty 30

## 2013-07-02 MED ORDER — HYOSCYAMINE SULFATE 0.125 MG SL SUBL: 0.1250 mg | SUBLINGUAL_TABLET | SUBLINGUAL | Status: DC | PRN

## 2013-07-02 MED ORDER — PANTOPRAZOLE SODIUM 20 MG PO TBEC: 20.0000 mg | DELAYED_RELEASE_TABLET | Freq: Two times a day (BID) | ORAL | Status: DC

## 2013-07-02 NOTE — ED Provider Notes (Signed)
CSN: [id number]     Arrival date & time 07/01/13  2107 History   First MD Initiated Contact with Patient 07/01/13 2219     Chief Complaint  Patient presents with  . Abdominal Pain   (Consider location/radiation/quality/duration/timing/severity/associated sxs/prior Treatment) HPI Comments: Patient is a 32 year old female with history of GERD who presents today with severe right upper quadrant pain. Reports that the pain began on Thursday and is associated with meals. She has been eating sausage and pizza for her past few meals. The pain occurs approximately 30 minutes after eating. The pain is sharp and colicky. The pain does not radiate from her right upper quadrant. She has not had pain like this in the past. She has had no abdominal surgeries. Her last bowel movement was earlier today. Prior to arrival she had one bout of diarrhea. She has associated nausea without vomiting. She denies any fever, chills, shortness of breath, chest pain.  The history is provided by the patient. No language interpreter was used.    Past Medical History  Diagnosis Date  . Abnormal Pap smear   . Endometrial hyperplasia without atypia, simple   . GERD (gastroesophageal reflux disease)     tums prn   Past Surgical History  Procedure Laterality Date  . Cesarean section  2000, 2004  . Diagnostic laparoscopy      endometriosis  . Cesarean section  04/02/2012    Procedure: CESAREAN SECTION;  Surgeon: Antionette Char, MD;  Location: [facility];  Service: Obstetrics;  Laterality: N/A;  Repeat cesarean section with delivery of baby girl at 60.   Family History  Problem Relation Age of Onset  . Hypertension Mother    History  Substance Use Topics  . Smoking status: Former Smoker -- 3 years    Types: Cigarettes    Quit date: 09/05/2007  . Smokeless tobacco: Never Used  . Alcohol Use: No   OB History   Grav Para Term Preterm Abortions TAB SAB Ect Mult Living   3 3 1       3      Review of Systems   Constitutional: Negative for fever and chills.  Respiratory: Negative for shortness of breath.   Cardiovascular: Negative for chest pain.  Gastrointestinal: Positive for nausea, abdominal pain and diarrhea. Negative for vomiting and constipation.  All other systems reviewed and are negative.    Allergies  Review of patient's allergies indicates no known allergies.  Home Medications   Current Outpatient Rx  Name  Route  Sig  Dispense  Refill  . butalbital-acetaminophen-caffeine (FIORICET, ESGIC) 50-325-40 MG per tablet   Oral   Take 1 tablet by mouth every 4 (four) hours as needed for headache.         . ibuprofen (ADVIL,MOTRIN) 200 MG tablet   Oral   Take 800 mg by mouth daily as needed for pain.          BP 117/76  Pulse 88  Temp(Src) 98.1 F (36.7 C) (Oral)  Resp 18  Ht 5\' 8"  (1.727 m)  Wt 210 lb (95.255 kg)  BMI 31.94 kg/m2  SpO2 97%  LMP 06/24/2013 Physical Exam  Nursing note and vitals reviewed. Constitutional: She is oriented to person, place, and time. She appears well-developed and well-nourished. She does not appear ill. She appears distressed.  HENT:  Head: Normocephalic and atraumatic.  Right Ear: External ear normal.  Left Ear: External ear normal.  Nose: Nose normal.  Mouth/Throat: Oropharynx is clear and moist.  Eyes: Conjunctivae  are normal.  Neck: Normal range of motion.  Cardiovascular: Normal rate, regular rhythm and normal heart sounds.   Pulmonary/Chest: Effort normal and breath sounds normal. No stridor. No respiratory distress. She has no wheezes. She has no rales.  Abdominal: Soft. She exhibits no distension. There is tenderness in the right upper quadrant. There is no rigidity, no rebound and no guarding.  Musculoskeletal: Normal range of motion.  Neurological: She is alert and oriented to person, place, and time. She has normal strength.  Skin: Skin is warm and dry. She is not diaphoretic. No erythema.  Psychiatric: She has a normal  mood and affect. Her behavior is normal.    ED Course  Procedures (including critical care time) Labs Review Labs Reviewed  CBC WITH DIFFERENTIAL - Abnormal; Notable for the following:    WBC 10.7 (*)    HCT 34.8 (*)    All other components within normal limits  URINALYSIS, ROUTINE W REFLEX MICROSCOPIC - Abnormal; Notable for the following:    APPearance CLOUDY (*)    Specific Gravity, Urine 1.032 (*)    All other components within normal limits  COMPREHENSIVE METABOLIC PANEL  LIPASE, BLOOD  POCT PREGNANCY, URINE   Imaging Review US Abdomen Complete  07/01/2013   CLINICAL DATA:  Right upper quadrant abdominal pain.  EXAM: ULTRASOUND ABDOMEN COMPLETE  COMPARISON:  CT scan from 04/14/2005  FINDINGS: Gallbladder  No gallstones or wall thickening visualized. No sonographic Murphy sign noted.  Common bile duct  Diameter: 4 mm  Liver  No focal lesion identified. Within normal limits in parenchymal echogenicity.  IVC  No abnormality visualized.  Pancreas  Pancreatic tail not well seen due to overlying bowel gas. Otherwise negative. Marland Kitchen  Spleen  Size and appearance within normal limits.  Right Kidney  Length: 10.3 cm. Echogenicity within normal limits. No mass or hydronephrosis visualized.  Left Kidney  Length: 12.4 cm. Echogenicity within normal limits. No mass or hydronephrosis visualized.  Abdominal aorta  No aneurysm visualized.  IMPRESSION: 1. No significant abnormality identified. Pancreatic tail was not well seen due to overlying bowel gas. 2. Discrepant size of the kidneys was not present on prior CT scan. I suspect that the right kidney is under measured given the difficulty in obtaining inadequate sonic window due to bowel gas.   Electronically Signed   By: Herbie Baltimore M.D.   On: 07/01/2013 23:55    EKG Interpretation   None       MDM   1. Right upper quadrant pain   2. Abdominal pain    Patient presents with right upper quadrant pain. Despite no abnormality seen on abdominal  ultrasound I believe that this pain is related to her gallbladder. Discussed possibility of biliary dyskinesia. She was given Levsin and GI followup. She was also started on Protonix and Pepcid for possible gastritis. Abdomen is soft and nonsurgical prior to discharge. Return instructions given. Vital signs stable for discharge. Discussed case with Dr. Norlene Campbell who agrees with plan. Patient / Family / Caregiver informed of clinical course, understand medical decision-making process, and agree with plan.   Mora Bellman, PA-C 07/02/13 [phone]

## 2013-07-02 NOTE — ED Provider Notes (Signed)
Medical screening examination/treatment/procedure(s) were performed by non-physician practitioner and as supervising physician I was immediately available for consultation/collaboration.    Thoms Barthelemy M Shaniqua Guillot, MD 07/02/13 0336 

## 2013-08-25 ENCOUNTER — Emergency Department (HOSPITAL_COMMUNITY): Payer: Self-pay

## 2013-08-25 ENCOUNTER — Emergency Department (HOSPITAL_COMMUNITY)
Admission: EM | Admit: 2013-08-25 | Discharge: 2013-08-26 | Disposition: A | Discharge status: Home / Self Care | Payer: Self-pay | Attending: Emergency Medicine | Admitting: Emergency Medicine

## 2013-08-25 DIAGNOSIS — R110 Nausea: Secondary | ICD-10-CM | POA: Insufficient documentation

## 2013-08-25 DIAGNOSIS — Z79899 Other long term (current) drug therapy: Secondary | ICD-10-CM | POA: Insufficient documentation

## 2013-08-25 DIAGNOSIS — R1011 Right upper quadrant pain: Secondary | ICD-10-CM | POA: Insufficient documentation

## 2013-08-25 DIAGNOSIS — Z9889 Other specified postprocedural states: Secondary | ICD-10-CM | POA: Insufficient documentation

## 2013-08-25 DIAGNOSIS — M25519 Pain in unspecified shoulder: Secondary | ICD-10-CM | POA: Insufficient documentation

## 2013-08-25 DIAGNOSIS — R143 Flatulence: Secondary | ICD-10-CM

## 2013-08-25 DIAGNOSIS — Z8742 Personal history of other diseases of the female genital tract: Secondary | ICD-10-CM | POA: Insufficient documentation

## 2013-08-25 DIAGNOSIS — K219 Gastro-esophageal reflux disease without esophagitis: Secondary | ICD-10-CM | POA: Insufficient documentation

## 2013-08-25 DIAGNOSIS — R142 Eructation: Secondary | ICD-10-CM | POA: Insufficient documentation

## 2013-08-25 DIAGNOSIS — R141 Gas pain: Secondary | ICD-10-CM | POA: Insufficient documentation

## 2013-08-25 DIAGNOSIS — R109 Unspecified abdominal pain: Secondary | ICD-10-CM

## 2013-08-25 DIAGNOSIS — Z87891 Personal history of nicotine dependence: Secondary | ICD-10-CM | POA: Insufficient documentation

## 2013-08-25 LAB — CBC WITH DIFFERENTIAL/PLATELET
Basophils Absolute: 0 10*3/uL (ref 0.0–0.1)
Basophils Relative: 0 % (ref 0–1)
EOS ABS: 0.1 10*3/uL (ref 0.0–0.7)
Eosinophils Relative: 1 % (ref 0–5)
HCT: 34.9 % — ABNORMAL LOW (ref 36.0–46.0)
HEMOGLOBIN: 11.8 g/dL — AB (ref 12.0–15.0)
LYMPHS ABS: 2.8 10*3/uL (ref 0.7–4.0)
Lymphocytes Relative: 28 % (ref 12–46)
MCH: 30.2 pg (ref 26.0–34.0)
MCHC: 33.8 g/dL (ref 30.0–36.0)
MCV: 89.3 fL (ref 78.0–100.0)
MONOS PCT: 6 % (ref 3–12)
Monocytes Absolute: 0.5 10*3/uL (ref 0.1–1.0)
NEUTROS ABS: 6.3 10*3/uL (ref 1.7–7.7)
NEUTROS PCT: 65 % (ref 43–77)
PLATELETS: 236 10*3/uL (ref 150–400)
RBC: 3.91 MIL/uL (ref 3.87–5.11)
RDW: 12.7 % (ref 11.5–15.5)
WBC: 9.7 10*3/uL (ref 4.0–10.5)

## 2013-08-25 LAB — COMPREHENSIVE METABOLIC PANEL
ALBUMIN: 3.6 g/dL (ref 3.5–5.2)
ALK PHOS: 94 U/L (ref 39–117)
ALT: 11 U/L (ref 0–35)
AST: 13 U/L (ref 0–37)
BUN: 10 mg/dL (ref 6–23)
CHLORIDE: 101 meq/L (ref 96–112)
CO2: 27 mEq/L (ref 19–32)
Calcium: 8.8 mg/dL (ref 8.4–10.5)
Creatinine, Ser: 0.82 mg/dL (ref 0.50–1.10)
GFR calc non Af Amer: >90 mL/min (ref >90)
GLUCOSE: 105 mg/dL — AB (ref 70–99)
POTASSIUM: 3.9 meq/L (ref 3.7–5.3)
SODIUM: 137 meq/L (ref 137–147)
TOTAL PROTEIN: 6.6 g/dL (ref 6.0–8.3)

## 2013-08-25 LAB — LIPASE, BLOOD: Lipase: 27 U/L (ref 11–59)

## 2013-08-25 MED ORDER — ONDANSETRON HCL 4 MG/2ML IJ SOLN
4.0000 mg | Freq: Once | INTRAMUSCULAR | Status: AC
  Administered 2013-08-25: 4 mg via INTRAVENOUS
  Filled 2013-08-25: qty 2

## 2013-08-25 MED ORDER — MORPHINE SULFATE 4 MG/ML IJ SOLN
4.0000 mg | Freq: Once | INTRAMUSCULAR | Status: AC
  Administered 2013-08-25: 4 mg via INTRAVENOUS
  Filled 2013-08-25: qty 1

## 2013-08-25 MED ORDER — HYDROMORPHONE HCL PF 1 MG/ML IJ SOLN
0.5000 mg | Freq: Once | INTRAMUSCULAR | Status: AC
  Administered 2013-08-25: 0.5 mg via INTRAVENOUS
  Filled 2013-08-25: qty 1

## 2013-08-25 NOTE — ED Notes (Signed)
Pt arrived to the ED with a complaint of abdominal pain.  Pt has a hx of gall bladder issues and is on medication for such.  [facility] and has a hot pack meal that instantly disagreed with her stomach.  Pt is experiencing pain in the shoulder and right upper abdomen.  Pt states she feels as if her abdomen is bloated.

## 2013-08-25 NOTE — ED Notes (Signed)
US at bedside at this time 

## 2013-08-25 NOTE — ED Provider Notes (Signed)
CSN: [id number]     Arrival date & time 08/25/13  2048 History   First MD Initiated Contact with Patient 08/25/13 2117     Chief Complaint  Patient presents with  . Abdominal Pain   (Consider location/radiation/quality/duration/timing/severity/associated sxs/prior Treatment) HPI  This is a 33 year old female with a history of reflux who presents with right upper quadrant pain, bloating, and right shoulder pain. Patient reports onset of symptoms earlier today after she 8 a "hot pack meal" at [employer]. She's had symptoms like this before which she was told may be related to her gallbladder. Patient denies any fevers. She reports constant right upper quadrant and shoulder pain since eating. She endorses nausea without vomiting. She denies any diarrhea.  Patient currently rates her pain an 8/10. Patient also reports that she feels bloated.  Past Medical History  Diagnosis Date  . Abnormal Pap smear   . Endometrial hyperplasia without atypia, simple   . GERD (gastroesophageal reflux disease)     tums prn   Past Surgical History  Procedure Laterality Date  . Cesarean section  2000, 2004  . Diagnostic laparoscopy      endometriosis  . Cesarean section  04/02/2012    Procedure: CESAREAN SECTION;  Surgeon: Antionette Char, MD;  Location: [facility];  Service: Obstetrics;  Laterality: N/A;  Repeat cesarean section with delivery of baby girl at 26.   Family History  Problem Relation Age of Onset  . Hypertension Mother    History  Substance Use Topics  . Smoking status: Former Smoker -- 3 years    Types: Cigarettes    Quit date: 09/05/2007  . Smokeless tobacco: Never Used  . Alcohol Use: No   OB History   Grav Para Term Preterm Abortions TAB SAB Ect Mult Living   3 3 1       3      Review of Systems  Constitutional: Negative for fever.  Respiratory: Negative for cough, chest tightness and shortness of breath.   Cardiovascular: Negative for chest pain.   Gastrointestinal: Positive for nausea and abdominal pain. Negative for vomiting, diarrhea and constipation.  Genitourinary: Negative for dysuria.  Musculoskeletal: Negative for back pain.       Right shoulder pain  Skin: Negative for wound.  Neurological: Negative for headaches.  Psychiatric/Behavioral: Negative for confusion.  All other systems reviewed and are negative.    Allergies  Review of patient's allergies indicates no known allergies.  Home Medications   Current Outpatient Rx  Name  Route  Sig  Dispense  Refill  . acetaminophen (TYLENOL) 500 MG tablet   Oral   Take 500-1,000 mg by mouth every 6 (six) hours as needed for mild pain or headache.         . esomeprazole (NEXIUM) 40 MG capsule   Oral   Take 40 mg by mouth every morning.         . famotidine (PEPCID) 20 MG tablet   Oral   Take 1 tablet (20 mg total) by mouth 2 (two) times daily.   30 tablet   0   . hyoscyamine (LEVSIN SL) 0.125 MG SL tablet   Sublingual   Place 0.125 mg under the tongue every 4 (four) hours as needed for cramping (abdominal pain).         . ondansetron (ZOFRAN) 4 MG tablet   Oral   Take 4-8 mg by mouth every 8 (eight) hours as needed for nausea or vomiting.         Marland Kitchen  pantoprazole (PROTONIX) 20 MG tablet   Oral   Take 1 tablet (20 mg total) by mouth 2 (two) times daily.   30 tablet   0   . HYDROcodone-acetaminophen (NORCO/VICODIN) 5-325 MG per tablet   Oral   Take 1 tablet by mouth every 6 (six) hours as needed.   10 tablet   0   . ondansetron (ZOFRAN) 4 MG tablet   Oral   Take 1 tablet (4 mg total) by mouth every 6 (six) hours.   12 tablet   0    BP 100/53  Pulse 73  Temp(Src) 98.1 F (36.7 C) (Oral)  Resp 16  Ht 5\' 8"  (1.727 m)  Wt 211 lb (95.709 kg)  BMI 32.09 kg/m2  SpO2 92%  LMP 08/18/2013 Physical Exam  Nursing note and vitals reviewed. Constitutional: She is oriented to person, place, and time. She appears well-developed and well-nourished.   Uncomfortable appearing  HENT:  Head: Normocephalic and atraumatic.  Eyes: Pupils are equal, round, and reactive to light.  Neck: Neck supple.  Cardiovascular: Normal rate, regular rhythm and normal heart sounds.   Pulmonary/Chest: Effort normal and breath sounds normal. No respiratory distress. She has no wheezes.  Abdominal: Soft. Bowel sounds are normal. There is tenderness. There is no rebound and no guarding.  Positive Murphy's sign  Musculoskeletal: She exhibits no edema.  Neurological: She is alert and oriented to person, place, and time.  Skin: Skin is warm and dry.  Psychiatric: She has a normal mood and affect.    ED Course  Procedures (including critical care time) Labs Review Labs Reviewed  CBC WITH DIFFERENTIAL - Abnormal; Notable for the following:    Hemoglobin 11.8 (*)    HCT 34.9 (*)    All other components within normal limits  COMPREHENSIVE METABOLIC PANEL - Abnormal; Notable for the following:    Glucose, Bld 105 (*)    Total Bilirubin <0.2 (*)    All other components within normal limits  LIPASE, BLOOD   Imaging Review [place] Abdomen Limited Ruq  08/26/2013   CLINICAL DATA:  Evaluate gallbladder.  Right upper quadrant pain.  EXAM: US ABDOMEN LIMITED - RIGHT UPPER QUADRANT  COMPARISON:  None.  FINDINGS: Gallbladder  No gallstones or wall thickening visualized. No sonographic Murphy sign noted.  Common bile duct  Diameter: 4 mm where seen.  Liver:  No focal lesion identified. Within normal limits in parenchymal echogenicity.Antegrade flow in the imaged portal venous system.  IMPRESSION: No cholelithiasis or acute cholecystitis.   Electronically Signed   By: Tiburcio PeaJonathan  Watts M.D.   On: 08/26/2013 00:18    EKG Interpretation   None       MDM   1. Abdominal pain    Patient presents with RUQ pain and bloating.  Nontoxic on exam and VS wnl.  Labwork was obtained and patient given pain and nausea medicine.  RUQ unremarkable.  Reexamination is reassuring and  without evidence of peritonitis.  Patient was advised to follow-up with PCP and may need referral to GI.  Patient stated understanding.  After history, exam, and medical workup I feel the patient has been appropriately medically screened and is safe for discharge home. Pertinent diagnoses were discussed with the patient. Patient was given return precautions.     Shon Batonourtney F Erna Brossard, MD 08/27/13 [phone]

## 2013-08-26 ENCOUNTER — Encounter (HOSPITAL_COMMUNITY): Payer: Self-pay | Admitting: Emergency Medicine

## 2013-08-26 MED ORDER — HYDROCODONE-ACETAMINOPHEN 5-325 MG PO TABS: 1.0000 | ORAL_TABLET | Freq: Four times a day (QID) | ORAL | Status: DC | PRN

## 2013-08-26 MED ORDER — ONDANSETRON HCL 4 MG PO TABS: 4.0000 mg | ORAL_TABLET | Freq: Four times a day (QID) | ORAL | Status: DC

## 2013-08-26 NOTE — Discharge Instructions (Signed)
Abdominal Pain, Adult °Many things can cause abdominal pain. Usually, abdominal pain is not caused by a disease and will improve without treatment. It can often be observed and treated at home. Your health care provider will do a physical exam and possibly order blood tests and X-rays to help determine the seriousness of your pain. However, in many cases, more time must pass before a clear cause of the pain can be found. Before that point, your health care provider may not know if you need more testing or further treatment. °HOME CARE INSTRUCTIONS  °Monitor your abdominal pain for any changes. The following actions may help to alleviate any discomfort you are experiencing: °· Only take over-the-counter or prescription medicines as directed by your health care provider. °· Do not take laxatives unless directed to do so by your health care provider. °· Try a clear liquid diet (broth, tea, or water) as directed by your health care provider. Slowly move to a bland diet as tolerated. °SEEK MEDICAL CARE IF: °· You have unexplained abdominal pain. °· You have abdominal pain associated with nausea or diarrhea. °· You have pain when you urinate or have a bowel movement. °· You experience abdominal pain that wakes you in the night. °· You have abdominal pain that is worsened or improved by eating food. °· You have abdominal pain that is worsened with eating fatty foods. °SEEK IMMEDIATE MEDICAL CARE IF:  °· Your pain does not go away within 2 hours. °· You have a fever. °· You keep throwing up (vomiting). °· Your pain is felt only in portions of the abdomen, such as the right side or the left lower portion of the abdomen. °· You pass bloody or black tarry stools. °MAKE SURE YOU: °· Understand these instructions.   °· Will watch your condition.   °· Will get help right away if you are not doing well or get worse.   °Document Released: 05/11/2005 Document Revised: 05/22/2013 Document Reviewed: 04/10/2013 °[employer]® Patient  Information ©2014 [employer]. ° °

## 2013-11-18 ENCOUNTER — Emergency Department (INDEPENDENT_AMBULATORY_CARE_PROVIDER_SITE_OTHER)
Admission: EM | Admit: 2013-11-18 | Discharge: 2013-11-18 | Disposition: A | Discharge status: Home / Self Care | Source: Home / Self Care | Attending: Family Medicine | Admitting: Family Medicine

## 2013-11-18 ENCOUNTER — Encounter (HOSPITAL_COMMUNITY): Payer: Self-pay | Admitting: Emergency Medicine

## 2013-11-18 DIAGNOSIS — J019 Acute sinusitis, unspecified: Secondary | ICD-10-CM

## 2013-11-18 MED ORDER — MINOCYCLINE HCL 100 MG PO CAPS: 100.0000 mg | ORAL_CAPSULE | Freq: Two times a day (BID) | ORAL | Status: DC

## 2013-11-18 MED ORDER — IPRATROPIUM BROMIDE 0.06 % NA SOLN: 2.0000 | Freq: Four times a day (QID) | NASAL | Status: DC

## 2013-11-18 MED ORDER — HYDROCOD POLST-CHLORPHEN POLST 10-8 MG/5ML PO LQCR: 5.0000 mL | Freq: Two times a day (BID) | ORAL | Status: DC | PRN

## 2013-11-18 NOTE — Discharge Instructions (Signed)
Drink plenty of fluids as discussed, use medicine as prescribed, and mucinex or delsym for cough. Return or see your doctor if further problems °

## 2013-11-18 NOTE — ED Provider Notes (Signed)
CSN: [id number]     Arrival date & time 11/18/13  1500 History   First MD Initiated Contact with Patient 11/18/13 1648     Chief Complaint  Patient presents with  . URI   (Consider location/radiation/quality/duration/timing/severity/associated sxs/prior Treatment) Patient is a 33 y.o. female presenting with URI. The history is provided by the patient.  URI Presenting symptoms: congestion, cough, facial pain and rhinorrhea   Presenting symptoms: no fever and no sore throat   Severity:  Moderate Onset quality:  Gradual Duration:  1 week Progression:  Unchanged Chronicity:  New Risk factors: no sick contacts     Past Medical History  Diagnosis Date  . Abnormal Pap smear   . Endometrial hyperplasia without atypia, simple   . GERD (gastroesophageal reflux disease)     tums prn   Past Surgical History  Procedure Laterality Date  . Cesarean section  2000, 2004  . Diagnostic laparoscopy      endometriosis  . Cesarean section  04/02/2012    Procedure: CESAREAN SECTION;  Surgeon: Antionette CharLisa Jackson-Moore, MD;  Location: [facility];  Service: Obstetrics;  Laterality: N/A;  Repeat cesarean section with delivery of baby girl at [id number].   Family History  Problem Relation Age of Onset  . Hypertension Mother    History  Substance Use Topics  . Smoking status: Former Smoker -- 3 years    Types: Cigarettes    Quit date: 09/05/2007  . Smokeless tobacco: Never Used  . Alcohol Use: No   OB History   Grav Para Term Preterm Abortions TAB SAB Ect Mult Living   3 3 1       3      Review of Systems  Constitutional: Negative.  Negative for fever.  HENT: Positive for congestion, postnasal drip, rhinorrhea and sinus pressure. Negative for sore throat.   Respiratory: Positive for cough.     Allergies  Review of patient's allergies indicates no known allergies.  Home Medications   Current Outpatient Rx  Name  Route  Sig  Dispense  Refill  . acetaminophen (TYLENOL) 500 MG tablet   Oral   Take  500-1,000 mg by mouth every 6 (six) hours as needed for mild pain or headache.         . chlorpheniramine-HYDROcodone (TUSSIONEX PENNKINETIC ER) 10-8 MG/5ML LQCR   Oral   Take 5 mLs by mouth every 12 (twelve) hours as needed for cough.   115 mL   0   . esomeprazole (NEXIUM) 40 MG capsule   Oral   Take 40 mg by mouth every morning.         . famotidine (PEPCID) 20 MG tablet   Oral   Take 1 tablet (20 mg total) by mouth 2 (two) times daily.   30 tablet   0   . HYDROcodone-acetaminophen (NORCO/VICODIN) 5-325 MG per tablet   Oral   Take 1 tablet by mouth every 6 (six) hours as needed.   10 tablet   0   . hyoscyamine (LEVSIN SL) 0.125 MG SL tablet   Sublingual   Place 0.125 mg under the tongue every 4 (four) hours as needed for cramping (abdominal pain).         Marland Kitchen. ipratropium (ATROVENT) 0.06 % nasal spray   Each Nare   Place 2 sprays into both nostrils 4 (four) times daily.   15 mL   1   . minocycline (MINOCIN,DYNACIN) 100 MG capsule   Oral   Take 1 capsule (100 mg total) by  mouth 2 (two) times daily.   20 capsule   0   . ondansetron (ZOFRAN) 4 MG tablet   Oral   Take 4-8 mg by mouth every 8 (eight) hours as needed for nausea or vomiting.         . ondansetron (ZOFRAN) 4 MG tablet   Oral   Take 1 tablet (4 mg total) by mouth every 6 (six) hours.   12 tablet   0   . pantoprazole (PROTONIX) 20 MG tablet   Oral   Take 1 tablet (20 mg total) by mouth 2 (two) times daily.   30 tablet   0    BP 123/85  Pulse 68  Temp(Src) 98.8 F (37.1 C) (Oral)  Resp 18  SpO2 99%  LMP 11/03/2013  Breastfeeding? No Physical Exam  Nursing note and vitals reviewed. Constitutional: She is oriented to person, place, and time. She appears well-developed and well-nourished. No distress.  HENT:  Head: Normocephalic.  Right Ear: External ear normal.  Left Ear: External ear normal.  Nose: Mucosal edema and rhinorrhea present.  Mouth/Throat: Oropharynx is clear and moist.   Neck: Normal range of motion. Neck supple.  Cardiovascular: Normal heart sounds.   Pulmonary/Chest: Effort normal and breath sounds normal.  Lymphadenopathy:    She has no cervical adenopathy.  Neurological: She is alert and oriented to person, place, and time.  Skin: Skin is warm and dry.    ED Course  Procedures (including critical care time) Labs Review Labs Reviewed - No data to display Imaging Review No results found.   MDM   1. Sinusitis, acute        Linna Hoff, MD 11/18/13 [phone]

## 2013-11-18 NOTE — ED Notes (Signed)
C/o 1 week duration of URI syx, getting worse w green nasal secretions, facial pain and pressure, dental pain, worse when lays down at night or when bends over

## 2013-11-24 ENCOUNTER — Encounter (HOSPITAL_COMMUNITY): Payer: Self-pay | Admitting: Emergency Medicine

## 2013-11-24 ENCOUNTER — Emergency Department (INDEPENDENT_AMBULATORY_CARE_PROVIDER_SITE_OTHER)
Admission: EM | Admit: 2013-11-24 | Discharge: 2013-11-24 | Disposition: A | Discharge status: Home / Self Care | Source: Home / Self Care

## 2013-11-24 DIAGNOSIS — J309 Allergic rhinitis, unspecified: Secondary | ICD-10-CM

## 2013-11-24 DIAGNOSIS — J45909 Unspecified asthma, uncomplicated: Secondary | ICD-10-CM

## 2013-11-24 MED ORDER — IPRATROPIUM-ALBUTEROL 0.5-2.5 (3) MG/3ML IN SOLN
3.0000 mL | Freq: Once | RESPIRATORY_TRACT | Status: AC
  Administered 2013-11-24: 3 mL via RESPIRATORY_TRACT

## 2013-11-24 MED ORDER — TRIAMCINOLONE ACETONIDE 40 MG/ML IJ SUSP
INTRAMUSCULAR | Status: AC
  Filled 2013-11-24: qty 1

## 2013-11-24 MED ORDER — ALBUTEROL SULFATE HFA 108 (90 BASE) MCG/ACT IN AERS: 2.0000 | INHALATION_SPRAY | RESPIRATORY_TRACT | Status: DC | PRN

## 2013-11-24 MED ORDER — SODIUM CHLORIDE 0.9 % IN NEBU
INHALATION_SOLUTION | RESPIRATORY_TRACT | Status: AC
  Filled 2013-11-24: qty 3

## 2013-11-24 MED ORDER — IPRATROPIUM-ALBUTEROL 0.5-2.5 (3) MG/3ML IN SOLN
RESPIRATORY_TRACT | Status: AC
  Filled 2013-11-24: qty 3

## 2013-11-24 MED ORDER — METHYLPREDNISOLONE 4 MG PO KIT: PACK | ORAL | Status: DC

## 2013-11-24 MED ORDER — TRIAMCINOLONE ACETONIDE 40 MG/ML IJ SUSP
40.0000 mg | Freq: Once | INTRAMUSCULAR | Status: AC
  Administered 2013-11-24: 40 mg via INTRAMUSCULAR

## 2013-11-24 NOTE — Discharge Instructions (Signed)
Allergic Rhinitis Allegra 180 mg May cont mucinex d flonase nasal spray and lots of saline nasal spray Use albuterol as needed every 4 hours. Bronchospasm, Adult A bronchospasm is a spasm or tightening of the airways going into the lungs. During a bronchospasm breathing becomes more difficult because the airways get smaller. When this happens there can be coughing, a whistling sound when breathing (wheezing), and difficulty breathing. Bronchospasm is often associated with asthma, but not all patients who experience a bronchospasm have asthma. CAUSES  A bronchospasm is caused by inflammation or irritation of the airways. The inflammation or irritation may be triggered by:   Allergies (such as to animals, pollen, food, or mold). Allergens that cause bronchospasm may cause wheezing immediately after exposure or many hours later.   Infection. Viral infections are believed to be the most common cause of bronchospasm.   Exercise.   Irritants (such as pollution, cigarette smoke, strong odors, aerosol sprays, and paint fumes).   Weather changes. Winds increase molds and pollens in the air. Rain refreshes the air by washing irritants out. Cold air may cause inflammation.   Stress and emotional upset.  SIGNS AND SYMPTOMS   Wheezing.   Excessive nighttime coughing.   Frequent or severe coughing with a simple cold.   Chest tightness.   Shortness of breath.  DIAGNOSIS  Bronchospasm is usually diagnosed through a history and physical exam. Tests, such as chest X-rays, are sometimes done to look for other conditions. TREATMENT   Inhaled medicines can be given to open up your airways and help you breathe. The medicines can be given using either an inhaler or a nebulizer machine.  Corticosteroid medicines may be given for severe bronchospasm, usually when it is associated with asthma. HOME CARE INSTRUCTIONS   Always have a plan prepared for seeking medical care. Know when to call  your health care provider and local emergency services (911 in the [place] Know where you can access local emergency care.  Only take medicines as directed by your health care provider.  If you were prescribed an inhaler or nebulizer machine, ask your health care provider to explain how to use it correctly. Always use a spacer with your inhaler if you were given one.  It is necessary to remain calm during an attack. Try to relax and breathe more slowly.  Control your home environment in the following ways:   Change your heating and air conditioning filter at least once a month.   Limit your use of fireplaces and wood stoves.  Do not smoke and do not allow smoking in your home.   Avoid exposure to perfumes and fragrances.   Get rid of pests (such as roaches and mice) and their droppings.   Throw away plants if you see mold on them.   Keep your house clean and dust free.   Replace carpet with wood, tile, or vinyl flooring. Carpet can trap dander and dust.   Use allergy-proof pillows, mattress covers, and box spring covers.   Wash bed sheets and blankets every week in hot water and dry them in a dryer.   Use blankets that are made of polyester or cotton.   Wash hands frequently. SEEK MEDICAL CARE IF:   You have muscle aches.   You have chest pain.   The sputum changes from clear or white to yellow, green, gray, or bloody.   The sputum you cough up gets thicker.   There are problems that may be related to the medicine you  are given, such as a rash, itching, swelling, or trouble breathing.  SEEK IMMEDIATE MEDICAL CARE IF:   You have worsening wheezing and coughing even after taking your prescribed medicines.   You have increased difficulty breathing.   You develop severe chest pain. MAKE SURE YOU:   Understand these instructions.  Will watch your condition.  Will get help right away if you are not doing well or get worse. Document Released:  08/04/2003 Document Revised: 04/03/2013 Document Reviewed: 01/21/2013 [facility] Patient Information 2014 [place], [place].  How to Use an Inhaler Using your inhaler correctly is very important. Good technique will make sure that the medicine reaches your lungs.  HOW TO USE AN INHALER: 1. Take the cap off the inhaler. 2. If this is the first time using your inhaler, you need to prime it. Shake the inhaler for 5 seconds. Release four puffs into the air, away from your face. Ask your doctor for help if you have questions. 3. Shake the inhaler for 5 seconds. 4. Turn the inhaler so the bottle is above the mouthpiece. 5. Put your pointer finger on top of the bottle. Your thumb holds the bottom of the inhaler. 6. Open your mouth. 7. Either hold the inhaler away from your mouth (the width of 2 fingers) or place your lips tightly around the mouthpiece. Ask your doctor which way to use your inhaler. 8. Breathe out as much air as possible. 9. Breathe in and push down on the bottle 1 time to release the medicine. You will feel the medicine go in your mouth and throat. 10. Continue to take a deep breath in very slowly. Try to fill your lungs. 11. After you have breathed in completely, hold your breath for 10 seconds. This will help the medicine to settle in your lungs. If you cannot hold your breath for 10 seconds, hold it for as long as you can before you breathe out. 12. Breathe out slowly, through pursed lips. Whistling is an example of pursed lips. 13. If your doctor has told you to take more than 1 puff, wait at least 15 30 seconds between puffs. This will help you get the best results from your medicine. Do not use the inhaler more than your doctor tells you to. 14. Put the cap back on the inhaler. 15. Follow the directions from your doctor or from the inhaler package about cleaning the inhaler. If you use more than one inhaler, ask your doctor which inhalers to use and what order to use them in. Ask  your doctor to help you figure out when you will need to refill your inhaler.  If you use a steroid inhaler, always rinse your mouth with water after your last puff, gargle and spit out the water. Do not swallow the water. GET HELP IF:  The inhaler medicine only partially helps to stop wheezing or shortness of breath.  You are having trouble using your inhaler.  You have some increase in thick spit (phlegm). GET HELP RIGHT AWAY IF:  The inhaler medicine does not help your wheezing or shortness of breath or you have tightness in your chest.  You have dizziness, headaches, or fast heart rate.  You have chills, fever, or night sweats.  You have a large increase of thick spit, or your thick spit is bloody. MAKE SURE YOU:   Understand these instructions.  Will watch your condition.  Will get help right away if you are not doing well or get worse. Document Released: 05/10/2008  Document Revised: 05/22/2013 Document Reviewed: 02/28/2013 [facility] Patient Information 2014 [place], [place].  Allergic rhinitis is when the mucous membranes in the nose respond to allergens. Allergens are particles in the air that cause your body to have an allergic reaction. This causes you to release allergic antibodies. Through a chain of events, these eventually cause you to release histamine into the blood stream. Although meant to protect the body, it is this release of histamine that causes your discomfort, such as frequent sneezing, congestion, and an itchy, runny nose.  CAUSES  Seasonal allergic rhinitis (hay fever) is caused by pollen allergens that may come from grasses, trees, and weeds. Year-round allergic rhinitis (perennial allergic rhinitis) is caused by allergens such as house dust mites, pet dander, and mold spores.  SYMPTOMS   Nasal stuffiness (congestion).  Itchy, runny nose with sneezing and tearing of the eyes. DIAGNOSIS  Your health care provider can help you determine the allergen or  allergens that trigger your symptoms. If you and your health care provider are unable to determine the allergen, skin or blood testing may be used. TREATMENT  Allergic Rhinitis does not have a cure, but it can be controlled by:  Medicines and allergy shots (immunotherapy).  Avoiding the allergen. Hay fever may often be treated with antihistamines in pill or nasal spray forms. Antihistamines block the effects of histamine. There are over-the-counter medicines that may help with nasal congestion and swelling around the eyes. Check with your health care provider before taking or giving this medicine.  If avoiding the allergen or the medicine prescribed do not work, there are many new medicines your health care provider can prescribe. Stronger medicine may be used if initial measures are ineffective. Desensitizing injections can be used if medicine and avoidance does not work. Desensitization is when a patient is given ongoing shots until the body becomes less sensitive to the allergen. Make sure you follow up with your health care provider if problems continue. HOME CARE INSTRUCTIONS It is not possible to completely avoid allergens, but you can reduce your symptoms by taking steps to limit your exposure to them. It helps to know exactly what you are allergic to so that you can avoid your specific triggers. SEEK MEDICAL CARE IF:   You have a fever.  You develop a cough that does not stop easily (persistent).  You have shortness of breath.  You start wheezing.  Symptoms interfere with normal daily activities. Document Released: 04/26/2001 Document Revised: 05/22/2013 Document Reviewed: 04/08/2013 [facility] Patient Information 2014 [place], [place].

## 2013-11-24 NOTE — ED Provider Notes (Signed)
Medical screening examination/treatment/procedure(s) were performed by resident physician or non-physician practitioner and as supervising physician I was immediately available for consultation/collaboration.   Marguerite Barba DOUGLAS MD.   Annie Roseboom D Meha Vidrine, MD 11/24/13 1020 

## 2013-11-24 NOTE — ED Provider Notes (Signed)
CSN: [id number]     Arrival date & time 11/24/13  0906 History   First MD Initiated Contact with Patient 11/24/13 [phone]     Chief Complaint  Patient presents with  . Re-evaluation   (Consider location/radiation/quality/duration/timing/severity/associated sxs/prior Treatment) HPI Comments: As above, this week additional exposure to outside allergies f/b upper resp congestion, nasal congestion, runny nose, PND, cough and wheezing   Past Medical History  Diagnosis Date  . Abnormal Pap smear   . Endometrial hyperplasia without atypia, simple   . GERD (gastroesophageal reflux disease)     tums prn   Past Surgical History  Procedure Laterality Date  . Cesarean section      x3  . Diagnostic laparoscopy      endometriosis  . Cesarean section  04/02/2012    Procedure: CESAREAN SECTION;  Surgeon: Antionette Char, MD;  Location: [facility];  Service: Obstetrics;  Laterality: N/A;  Repeat cesarean section with delivery of baby girl at 53.   Family History  Problem Relation Age of Onset  . Hypertension Mother    History  Substance Use Topics  . Smoking status: Former Smoker -- 3 years    Types: Cigarettes    Quit date: 09/05/2007  . Smokeless tobacco: Never Used  . Alcohol Use: No   OB History   Grav Para Term Preterm Abortions TAB SAB Ect Mult Living   3 3 1       3      Review of Systems  Constitutional: Negative for fever, chills, activity change, appetite change and fatigue.  HENT: Positive for congestion, postnasal drip, rhinorrhea, sinus pressure and sneezing. Negative for ear pain and facial swelling.   Eyes: Negative.   Respiratory: Negative.   Cardiovascular: Negative.   Genitourinary: Negative.   Musculoskeletal: Negative for neck pain and neck stiffness.  Skin: Negative for pallor and rash.  Neurological: Negative.     Allergies  Review of patient's allergies indicates no known allergies.  Home Medications   Current Outpatient Rx  Name  Route  Sig  Dispense   Refill  . chlorpheniramine-HYDROcodone (TUSSIONEX PENNKINETIC ER) 10-8 MG/5ML LQCR   Oral   Take 5 mLs by mouth every 12 (twelve) hours as needed for cough.   115 mL   0   . ipratropium (ATROVENT) 0.06 % nasal spray   Each Nare   Place 2 sprays into both nostrils 4 (four) times daily.   15 mL   1   . minocycline (MINOCIN,DYNACIN) 100 MG capsule   Oral   Take 1 capsule (100 mg total) by mouth 2 (two) times daily.   20 capsule   0   . acetaminophen (TYLENOL) 500 MG tablet   Oral   Take 500-1,000 mg by mouth every 6 (six) hours as needed for mild pain or headache.         . albuterol (PROVENTIL HFA;VENTOLIN HFA) 108 (90 BASE) MCG/ACT inhaler   Inhalation   Inhale 2 puffs into the lungs every 4 (four) hours as needed for wheezing or shortness of breath.   1 Inhaler   0   . esomeprazole (NEXIUM) 40 MG capsule   Oral   Take 40 mg by mouth every morning.         . famotidine (PEPCID) 20 MG tablet   Oral   Take 1 tablet (20 mg total) by mouth 2 (two) times daily.   30 tablet   0   . hyoscyamine (LEVSIN SL) 0.125 MG SL tablet  Sublingual   Place 0.125 mg under the tongue every 4 (four) hours as needed for cramping (abdominal pain).         . methylPREDNISolone (MEDROL DOSEPAK) 4 MG tablet      follow package directions. Take with food. Start 11/25/13   21 tablet   0   . pantoprazole (PROTONIX) 20 MG tablet   Oral   Take 1 tablet (20 mg total) by mouth 2 (two) times daily.   30 tablet   0    BP 122/83  Pulse 77  Temp(Src) 98.5 F (36.9 C) (Oral)  Resp 16  SpO2 100%  LMP 11/03/2013 Physical Exam  Nursing note and vitals reviewed. Constitutional: She is oriented to person, place, and time. She appears well-developed and well-nourished. No distress.  HENT:  Mouth/Throat: No oropharyngeal exudate.  Bilat TM's nl OP with clear PND and mild cobblestoning   Eyes: EOM are normal.  Neck: Normal range of motion. Neck supple.  Cardiovascular: Normal rate,  regular rhythm and normal heart sounds.   Pulmonary/Chest: Effort normal. No respiratory distress. She has wheezes.  I and O wheezing, frequent cough during auscultation. Good expansion and fair to good air movement  Musculoskeletal: Normal range of motion. She exhibits no edema.  Lymphadenopathy:    She has no cervical adenopathy.  Neurological: She is alert and oriented to person, place, and time.  Skin: Skin is warm and dry. No rash noted.  Psychiatric: She has a normal mood and affect.    ED Course  Procedures (including critical care time) Labs Review Labs Reviewed - No data to display Imaging Review No results found.   MDM   1. Allergic rhinosinusitis   2. RAD (reactive airway disease) with wheezing      DUoneb: Post duoneb at 1000h st she feels better, auscultation with increased air movement and decrease in wheezing but no complete abatement. Start meds below. D/C in stable and improved condition.  Kenalog 40 mg im Albuterol HFA Medrol dose pack, start tomorrow. Cont mucinex d Add Allegra 180 mg , flonase nasal spray nasal saline Lots of fluids  Hayden Rasmussenavid Shade Rivenbark, NP 11/24/13 1006

## 2013-11-24 NOTE — ED Notes (Signed)
Was seen 4/6 for URI & sinusitis - was getting better, states sputum had cleared, but had to be outside with thick pollen (for [employer] and [employer]); yesterday started feeling worse - sputum turning green again, c/o "lungs hurting", dizziness.  Continues taking minocycline as directed along with Tussionex and Atrovent nasal spray.  Ronchi noted - upper>lower.

## 2013-11-24 NOTE — ED Notes (Signed)
Breathing treatment in progress

## 2014-05-03 ENCOUNTER — Emergency Department (INDEPENDENT_AMBULATORY_CARE_PROVIDER_SITE_OTHER)
Admission: EM | Admit: 2014-05-03 | Discharge: 2014-05-03 | Disposition: A | Discharge status: Home / Self Care | Source: Home / Self Care | Attending: Family Medicine | Admitting: Family Medicine

## 2014-05-03 ENCOUNTER — Encounter (HOSPITAL_COMMUNITY): Payer: Self-pay | Admitting: Emergency Medicine

## 2014-05-03 DIAGNOSIS — J019 Acute sinusitis, unspecified: Secondary | ICD-10-CM

## 2014-05-03 DIAGNOSIS — H679 Otitis media in diseases classified elsewhere, unspecified ear: Secondary | ICD-10-CM

## 2014-05-03 DIAGNOSIS — H671 Otitis media in diseases classified elsewhere, right ear: Secondary | ICD-10-CM

## 2014-05-03 MED ORDER — AMOXICILLIN 500 MG PO CAPS: 1000.0000 mg | ORAL_CAPSULE | Freq: Three times a day (TID) | ORAL | Status: DC

## 2014-05-03 MED ORDER — HYDROCOD POLST-CHLORPHEN POLST 10-8 MG/5ML PO LQCR: 5.0000 mL | Freq: Every evening | ORAL | Status: DC | PRN

## 2014-05-03 NOTE — ED Provider Notes (Signed)
Medical screening examination/treatment/procedure(s) were performed by resident physician or non-physician practitioner and as supervising physician I was immediately available for consultation/collaboration.   Abriana Saltos DOUGLAS MD.   Tim Wilhide D Jak Haggar, MD 05/03/14 1708 

## 2014-05-03 NOTE — ED Provider Notes (Signed)
CSN: [id number]     Arrival date & time 05/03/14  1114 History   First MD Initiated Contact with Patient 05/03/14 1202     Chief Complaint  Patient presents with  . URI    Patient is a 33 y.o. female presenting with URI. The history is provided by the patient.  URI Presenting symptoms: congestion, cough, ear pain, facial pain, fatigue and fever   Severity:  Moderate Onset quality:  Gradual Duration:  2 days Timing:  Constant Progression:  Worsening Chronicity:  New Relieved by:  OTC medications Associated symptoms: headaches, sinus pain and swollen glands   Associated symptoms: no arthralgias, no myalgias, no neck pain, no sneezing and no wheezing   Risk factors: no chronic cardiac disease, no chronic kidney disease, no chronic respiratory disease, no diabetes mellitus, no immunosuppression, no recent illness, no recent travel and no sick contacts   Reports a 2 day h/o severe sinus congestion, facial pain, cough which is worse at night, Green nasal secretions and frontal area h/a's. States even her teeth hurt. Now she is having (R) ear pain as well. Last night she had chills and subjective fever. 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   Past Medical History  Diagnosis Date  . Abnormal Pap smear   . Endometrial hyperplasia without atypia, simple   . GERD (gastroesophageal reflux disease)     tums prn   Past Surgical History  Procedure Laterality Date  . Cesarean section      x3  . Diagnostic laparoscopy      endometriosis  . Cesarean section  04/02/2012    Procedure: CESAREAN SECTION;  Surgeon: Antionette Char, MD;  Location: [facility];  Service: Obstetrics;  Laterality: N/A;  Repeat cesarean section with delivery of baby girl at 30.   Family History  Problem Relation Age of Onset  . Hypertension Mother    History  Substance Use Topics  . Smoking status: Former Smoker -- 3 years    Types: Cigarettes    Quit date: 09/05/2007  .  Smokeless tobacco: Never Used  . Alcohol Use: No   OB History   Grav Para Term Preterm Abortions TAB SAB Ect Mult Living   Review of Systems  Constitutional: Positive for fever, chills and fatigue.  HENT: Positive for congestion, ear pain, postnasal drip and sinus pressure. Negative for sneezing.   Eyes: Negative.   Respiratory: Positive for cough. Negative for wheezing.   Cardiovascular: Negative.   Gastrointestinal: Negative.   Endocrine: Negative.   Genitourinary: Negative.   Musculoskeletal: Negative.  Negative for arthralgias, myalgias and neck pain.  Allergic/Immunologic: Negative.   Neurological: Positive for headaches.  Hematological: Negative.   Psychiatric/Behavioral: Negative.     Allergies  Review of patient's allergies indicates no known allergies.  Home Medications   Prior to Admission medications   Medication Sig Start Date End Date Taking? Authorizing Provider  acetaminophen (TYLENOL) 500 MG tablet Take 500-1,000 mg by mouth every 6 (six) hours as needed for mild pain or headache.    Historical Provider, MD  albuterol (PROVENTIL HFA;VENTOLIN HFA) 108 (90 BASE) MCG/ACT inhaler Inhale 2 puffs into the lungs every 4 (four) hours as needed for wheezing or shortness of breath. 11/24/13   Hayden Rasmussen, NP  amoxicillin (AMOXIL) 500 MG capsule Take 2 capsules (1,000 mg total) by mouth 3 (three) times daily. 05/03/14   Roma Kayser Kalie Cabral,  NP  chlorpheniramine-HYDROcodone (TUSSIONEX PENNKINETIC ER) 10-8 MG/5ML LQCR Take 5 mLs by mouth every 12 (twelve) hours as needed for cough. 11/18/13   Linna Hoff, MD  chlorpheniramine-HYDROcodone (TUSSIONEX PENNKINETIC ER) 10-8 MG/5ML LQCR Take 5 mLs by mouth at bedtime as needed for cough. 05/03/14   Roma Kayser Roel Douthat, NP  esomeprazole (NEXIUM) 40 MG capsule Take 40 mg by mouth every morning.    Historical Provider, MD  famotidine (PEPCID) 20 MG tablet Take 1 tablet (20 mg total) by mouth 2 (two) times daily. 07/02/13    Mora Bellman, PA-C  hyoscyamine (LEVSIN SL) 0.125 MG SL tablet Place 0.125 mg under the tongue every 4 (four) hours as needed for cramping (abdominal pain).    Historical Provider, MD  ipratropium (ATROVENT) 0.06 % nasal spray Place 2 sprays into both nostrils 4 (four) times daily. 11/18/13   Linna Hoff, MD  methylPREDNISolone (MEDROL DOSEPAK) 4 MG tablet follow package directions. Take with food. Start 11/25/13 11/24/13   Hayden Rasmussen, NP  minocycline (MINOCIN,DYNACIN) 100 MG capsule Take 1 capsule (100 mg total) by mouth 2 (two) times daily. 11/18/13   Linna Hoff, MD  pantoprazole (PROTONIX) 20 MG tablet Take 1 tablet (20 mg total) by mouth 2 (two) times daily. 07/02/13   Mora Bellman, PA-C   BP 117/73  Pulse 85  Temp(Src) 98.8 F (37.1 C) (Oral)  Resp 16  SpO2 98%  LMP 04/26/2014 Physical Exam  Constitutional: She appears well-developed and well-nourished.  HENT:  Head: Normocephalic and atraumatic.  Right Ear: External ear and ear canal normal. Tympanic membrane is erythematous.  Left Ear: Hearing, tympanic membrane, external ear and ear canal normal.  Nose: Mucosal edema present. Right sinus exhibits maxillary sinus tenderness and frontal sinus tenderness. Left sinus exhibits maxillary sinus tenderness and frontal sinus tenderness.  Mouth/Throat: Uvula is midline, oropharynx is clear and moist and mucous membranes are normal.  Eyes: Conjunctivae are normal.  Neck: Neck supple.  Cardiovascular: Normal rate and regular rhythm.   Pulmonary/Chest: Effort normal and breath sounds normal.  Lymphadenopathy:    She has cervical adenopathy.  Neurological: She is alert.  Skin: Skin is warm and dry.  Psychiatric: She has a normal mood and affect.    ED Course  Procedures (including critical care time) Labs Review Labs Reviewed - No data to display  Imaging Review No results found.   MDM   1. Acute sinusitis, recurrence not specified, unspecified location   2. Otitis media  of right ear in disease classified elsewhere    Amoxicillin 1000 mgf TID x 10 days. Tussionex 5 ml QHS PRN    Leanne Chang, NP 05/03/14 1236

## 2014-05-03 NOTE — Discharge Instructions (Signed)
Otitis Media  Otitis media is redness, soreness, and puffiness (swelling) in the space just behind your eardrum (middle ear). It may be caused by allergies or infection. It often happens along with a cold.  HOME CARE  · Take your medicine as told. Finish it even if you start to feel better.  · Only take over-the-counter or prescription medicines for pain, discomfort, or fever as told by your doctor.  · Follow up with your doctor as told.  GET HELP IF:  · You have otitis media only in one ear, or bleeding from your nose, or both.  · You notice a lump on your neck.  · You are not getting better in 3-5 days.  · You feel worse instead of better.  GET HELP RIGHT AWAY IF:   · You have pain that is not helped with medicine.  · You have puffiness, redness, or pain around your ear.  · You get a stiff neck.  · You cannot move part of your face (paralysis).  · You notice that the bone behind your ear hurts when you touch it.  MAKE SURE YOU:   · Understand these instructions.  · Will watch your condition.  · Will get help right away if you are not doing well or get worse.  Document Released: 01/18/2008 Document Revised: 08/06/2013 Document Reviewed: 02/26/2013  Exit[employer]® Patient Information ©2015 [employer]. This information is not intended to replace advice given to you by your health care provider. Make sure you discuss any questions you have with your health care provider.  Sinusitis  Sinusitis is redness, soreness, and puffiness (inflammation) of the air pockets in the bones of your face (sinuses). The redness, soreness, and puffiness can cause air and mucus to get trapped in your sinuses. This can allow germs to grow and cause an infection.   HOME CARE   · Drink enough fluids to keep your pee (urine) clear or pale yellow.  · Use a humidifier in your home.  · Run a hot shower to create steam in the bathroom. Sit in the bathroom with the door closed. Breathe in the steam 3-4 times a day.  · Put a warm, moist washcloth on  your face 3-4 times a day, or as told by your doctor.  · Use salt water sprays (saline sprays) to wet the thick fluid in your nose. This can help the sinuses drain.  · Only take medicine as told by your doctor.  GET HELP RIGHT AWAY IF:   · Your pain gets worse.  · You have very bad headaches.  · You are sick to your stomach (nauseous).  · You throw up (vomit).  · You are very sleepy (drowsy) all the time.  · Your face is puffy (swollen).  · Your vision changes.  · You have a stiff neck.  · You have trouble breathing.  MAKE SURE YOU:   · Understand these instructions.  · Will watch your condition.  · Will get help right away if you are not doing well or get worse.  Document Released: 01/18/2008 Document Revised: 04/25/2012 Document Reviewed: 03/06/2012  Exit[employer]® Patient Information ©2015 [employer]. This information is not intended to replace advice given to you by your health care provider. Make sure you discuss any questions you have with your health care provider.

## 2014-05-03 NOTE — ED Notes (Signed)
C/o cold sx onset 2 days Sx include: productive cough, facial pressure, chest/back pain due to cough, fevers, chills Denies v/n/d, SOB, wheezing Taking OTC cold sx w/no relief Alert, no signs of acute distress.

## 2014-06-16 ENCOUNTER — Encounter (HOSPITAL_COMMUNITY): Payer: Self-pay | Admitting: Emergency Medicine

## 2014-08-19 ENCOUNTER — Encounter (HOSPITAL_COMMUNITY): Payer: Self-pay | Admitting: Emergency Medicine

## 2014-08-19 ENCOUNTER — Emergency Department (HOSPITAL_COMMUNITY): Payer: Medicaid Other

## 2014-08-19 ENCOUNTER — Emergency Department (HOSPITAL_COMMUNITY)
Admission: EM | Admit: 2014-08-19 | Discharge: 2014-08-19 | Disposition: A | Discharge status: Home / Self Care | Payer: Medicaid Other | Attending: Emergency Medicine | Admitting: Emergency Medicine

## 2014-08-19 DIAGNOSIS — K219 Gastro-esophageal reflux disease without esophagitis: Secondary | ICD-10-CM | POA: Insufficient documentation

## 2014-08-19 DIAGNOSIS — Z79899 Other long term (current) drug therapy: Secondary | ICD-10-CM | POA: Diagnosis not present

## 2014-08-19 DIAGNOSIS — Z87891 Personal history of nicotine dependence: Secondary | ICD-10-CM | POA: Insufficient documentation

## 2014-08-19 DIAGNOSIS — Y990 Civilian activity done for income or pay: Secondary | ICD-10-CM | POA: Insufficient documentation

## 2014-08-19 DIAGNOSIS — Y9289 Other specified places as the place of occurrence of the external cause: Secondary | ICD-10-CM | POA: Insufficient documentation

## 2014-08-19 DIAGNOSIS — S99921A Unspecified injury of right foot, initial encounter: Secondary | ICD-10-CM

## 2014-08-19 DIAGNOSIS — R000 Tachycardia, unspecified: Secondary | ICD-10-CM | POA: Insufficient documentation

## 2014-08-19 DIAGNOSIS — Y9389 Activity, other specified: Secondary | ICD-10-CM | POA: Diagnosis not present

## 2014-08-19 DIAGNOSIS — W208XXA Other cause of strike by thrown, projected or falling object, initial encounter: Secondary | ICD-10-CM | POA: Diagnosis not present

## 2014-08-19 DIAGNOSIS — Z8742 Personal history of other diseases of the female genital tract: Secondary | ICD-10-CM | POA: Insufficient documentation

## 2014-08-19 DIAGNOSIS — Z792 Long term (current) use of antibiotics: Secondary | ICD-10-CM | POA: Diagnosis not present

## 2014-08-19 MED ORDER — OXYCODONE-ACETAMINOPHEN 5-325 MG PO TABS: 2.0000 | ORAL_TABLET | Freq: Once | ORAL | Status: DC

## 2014-08-19 MED ORDER — OXYCODONE-ACETAMINOPHEN 5-325 MG PO TABS: 2.0000 | ORAL_TABLET | ORAL | Status: DC | PRN

## 2014-08-19 MED ORDER — HYDROMORPHONE HCL 1 MG/ML IJ SOLN
1.0000 mg | Freq: Once | INTRAMUSCULAR | Status: AC
  Administered 2014-08-19: 1 mg via INTRAMUSCULAR
  Filled 2014-08-19: qty 1

## 2014-08-19 NOTE — ED Provider Notes (Signed)
CSN: [id number]     Arrival date & time 08/19/14  2129 History   First MD Initiated Contact with Patient 08/19/14 2149     Chief Complaint  Patient presents with  . Foot Injury     (Consider location/radiation/quality/duration/timing/severity/associated sxs/prior Treatment) Patient is a 34 y.o. female presenting with foot injury. The history is provided by the patient and medical records. No language interpreter was used.  Foot Injury Associated symptoms: no back pain, no fever and no neck pain      Ana Pace is a 35 y.o. female  with no major medical problems presents to the Emergency Department complaining of acute pain in the right foot extending from mid forefoot radiating to the toes onset 1 hour PTA.  Patient reports that a heavy wooden pallet fell from a standing position onto her right foot. She reports she's been unable to ambulate since that time due to pain. Patient rates the pain at a 10/10 and states it is sharp. She reports she has no numbness or tingling.  Patient denies associated symptoms.   Pt denies fever, chills, nausea, vomiting, ankle pain, knee pain, numbness, tingling.     Past Medical History  Diagnosis Date  . Abnormal Pap smear   . Endometrial hyperplasia without atypia, simple   . GERD (gastroesophageal reflux disease)     tums prn   Past Surgical History  Procedure Laterality Date  . Cesarean section      x3  . Diagnostic laparoscopy      endometriosis  . Cesarean section  04/02/2012    Procedure: CESAREAN SECTION;  Surgeon: Antionette Char, MD;  Location: [facility];  Service: Obstetrics;  Laterality: N/A;  Repeat cesarean section with delivery of baby girl at 80.   Family History  Problem Relation Age of Onset  . Hypertension Mother    History  Substance Use Topics  . Smoking status: Former Smoker -- 3 years    Types: Cigarettes    Quit date: 09/05/2007  . Smokeless tobacco: Never Used  . Alcohol Use: Yes     Comment: rare   OB  History    Gravida Para Term Preterm AB TAB SAB Ectopic Multiple Living   Review of Systems  Constitutional: Negative for fever and chills.  Gastrointestinal: Negative for nausea and vomiting.  Musculoskeletal: Positive for joint swelling and arthralgias. Negative for back pain, neck pain and neck stiffness.  Skin: Negative for wound.  Neurological: Negative for numbness.  Hematological: Does not bruise/bleed easily.  Psychiatric/Behavioral: The patient is not nervous/anxious.   All other systems reviewed and are negative.     Allergies  Review of patient's allergies indicates no known allergies.  Home Medications   Prior to Admission medications   Medication Sig Start Date End Date Taking? Authorizing Provider  acetaminophen (TYLENOL) 500 MG tablet Take 500-1,000 mg by mouth every 6 (six) hours as needed for mild pain or headache.    Historical Provider, MD  albuterol (PROVENTIL HFA;VENTOLIN HFA) 108 (90 BASE) MCG/ACT inhaler Inhale 2 puffs into the lungs every 4 (four) hours as needed for wheezing or shortness of breath. 11/24/13   Ana Rasmussen, NP  amoxicillin (AMOXIL) 500 MG capsule Take 2 capsules (1,000 mg total) by mouth 3 (three) times daily. 05/03/14   Ana Kayser Schorr, NP  chlorpheniramine-HYDROcodone (TUSSIONEX PENNKINETIC ER) 10-8 MG/5ML LQCR Take 5 mLs by mouth every 12 (twelve) hours as needed  for cough. 11/18/13   Ana Hoff, MD  chlorpheniramine-HYDROcodone (TUSSIONEX PENNKINETIC ER) 10-8 MG/5ML LQCR Take 5 mLs by mouth at bedtime as needed for cough. 05/03/14   Ana Kayser Schorr, NP  esomeprazole (NEXIUM) 40 MG capsule Take 40 mg by mouth every morning.    Historical Provider, MD  famotidine (PEPCID) 20 MG tablet Take 1 tablet (20 mg total) by mouth 2 (two) times daily. 07/02/13   Ana Bellman, PA-C  hyoscyamine (LEVSIN SL) 0.125 MG SL tablet Place 0.125 mg under the tongue every 4 (four) hours as needed for cramping (abdominal pain).     Historical Provider, MD  ipratropium (ATROVENT) 0.06 % nasal spray Place 2 sprays into both nostrils 4 (four) times daily. 11/18/13   Ana Hoff, MD  methylPREDNISolone (MEDROL DOSEPAK) 4 MG tablet follow package directions. Take with food. Start 11/25/13 11/24/13   Ana Rasmussen, NP  minocycline (MINOCIN,DYNACIN) 100 MG capsule Take 1 capsule (100 mg total) by mouth 2 (two) times daily. 11/18/13   Ana Hoff, MD  oxyCODONE-acetaminophen (PERCOCET/ROXICET) 5-325 MG per tablet Take 2 tablets by mouth every 4 (four) hours as needed for moderate pain or severe pain. 08/19/14   Jaline Pincock, PA-C  pantoprazole (PROTONIX) 20 MG tablet Take 1 tablet (20 mg total) by mouth 2 (two) times daily. 07/02/13   Ana Bellman, PA-C   BP 126/66 mmHg  Pulse 90  Temp(Src) 98.5 F (36.9 C) (Oral)  Resp 20  SpO2 99%  LMP 08/02/2014 (Exact Date) Physical Exam  Constitutional: She appears well-developed and well-nourished. No distress.  HENT:  Head: Normocephalic and atraumatic.  Eyes: Conjunctivae are normal.  Neck: Normal range of motion.  Cardiovascular: Regular rhythm, normal heart sounds and intact distal pulses.   Capillary refill < 3 sec Tachycardia, likely secondary to pain  Pulmonary/Chest: Effort normal and breath sounds normal.  Musculoskeletal: She exhibits tenderness. She exhibits no edema.  ROM: decreased range of motion in all toes of the right foot secondary to pain Significant tenderness to palpation of all toes of the right foot and right forefoot No visible deformity, ecchymosis, swelling, erythema or laceration  Neurological: She is alert. Coordination normal.  Sensation intact to dull and sharp Strength 2/5 in all toes of the right foot secondary to pain  Skin: Skin is warm and dry. She is not diaphoretic. No erythema.  No tenting of the skin  Psychiatric: She has a normal mood and affect.  Nursing note and vitals reviewed.   ED Course  Procedures (including critical care  time) Labs Review Labs Reviewed - No data to display  Imaging Review Dg Foot Complete Right  08/19/2014   CLINICAL DATA:  Right foot pain following pallet falling on foot, initial encounter  EXAM: RIGHT FOOT COMPLETE - 3+ VIEW  COMPARISON:  None.  FINDINGS: There is no evidence of fracture or dislocation. There is no evidence of arthropathy or other focal bone abnormality. Soft tissues are unremarkable.  IMPRESSION: No acute abnormality noted.   Electronically Signed   By: Alcide Clever M.D.   On: 08/19/2014 22:00     EKG Interpretation None      MDM   Final diagnoses:  Right foot injury, initial encounter   Ana Pace presents with pain in the right foot after dropping a heavy pallet on it.  Patient X-Ray negative for obvious fracture or dislocation. Pain managed in ED. Pt advised to follow up with orthopedics if symptoms persist for possibility of missed  fracture diagnosis. Patient given crutches and post-op shoe while in ED, conservative therapy recommended and discussed. Patient will be dc home & is agreeable with above plan.  I have personally reviewed patient's vitals, nursing note and any pertinent labs or imaging.  I performed an focused physical exam; undressed when appropriate .    It has been determined that no acute conditions requiring further emergency intervention are present at this time. The patient/guardian have been advised of the diagnosis and plan. I reviewed any labs and imaging including any potential incidental findings. We have discussed signs and symptoms that warrant return to the ED and they are listed in the discharge instructions.    Vital signs are stable at discharge.   BP 126/66 mmHg  Pulse 90  Temp(Src) 98.5 F (36.9 C) (Oral)  Resp 20  SpO2 99%  LMP 08/02/2014 (Exact Date)       Dierdre ForthHannah Antwain Caliendo, PA-C 08/19/14 2347  Elwin MochaBlair Walden, MD 08/19/14 [phone]

## 2014-08-19 NOTE — Discharge Instructions (Signed)
1. Medications: percocet, usual home medications 2. Treatment: rest, drink plenty of fluids, use crutches and post op shoe 3. Follow Up: Please followup with your primary doctor or orthopedics in 7 days for discussion of your diagnoses and further evaluation after today's visit; if you do not have a primary care doctor use the resource guide provided to find one; Please return to the ER for worsening symptoms.   Foot Sprain The muscles and cord like structures which attach muscle to bone (tendons) that surround the feet are made up of units. A foot sprain can occur at the weakest spot in any of these units. This condition is most often caused by injury to or overuse of the foot, as from playing contact sports, or aggravating a previous injury, or from poor conditioning, or obesity. SYMPTOMS  Pain with movement of the foot.  Tenderness and swelling at the injury site.  Loss of strength is present in moderate or severe sprains. THE THREE GRADES OR SEVERITY OF FOOT SPRAIN ARE:  Mild (Grade I): Slightly pulled muscle without tearing of muscle or tendon fibers or loss of strength.  Moderate (Grade II): Tearing of fibers in a muscle, tendon, or at the attachment to bone, with small decrease in strength.  Severe (Grade III): Rupture of the muscle-tendon-bone attachment, with separation of fibers. Severe sprain requires surgical repair. Often repeating (chronic) sprains are caused by overuse. Sudden (acute) sprains are caused by direct injury or over-use. DIAGNOSIS  Diagnosis of this condition is usually by your own observation. If problems continue, a caregiver may be required for further evaluation and treatment. X-rays may be required to make sure there are not breaks in the bones (fractures) present. Continued problems may require physical therapy for treatment. PREVENTION  Use strength and conditioning exercises appropriate for your sport.  Warm up properly prior to working out.  Use  athletic shoes that are made for the sport you are participating in.  Allow adequate time for healing. Early return to activities makes repeat injury more likely, and can lead to an unstable arthritic foot that can result in prolonged disability. Mild sprains generally heal in 3 to 10 days, with moderate and severe sprains taking 2 to 10 weeks. Your caregiver can help you determine the proper time required for healing. HOME CARE INSTRUCTIONS   Apply ice to the injury for 15-20 minutes, 03-04 times per day. Put the ice in a plastic bag and place a towel between the bag of ice and your skin.  An elastic wrap (like an Ace bandage) may be used to keep swelling down.  Keep foot above the level of the heart, or at least raised on a footstool, when swelling and pain are present.  Try to avoid use other than gentle range of motion while the foot is painful. Do not resume use until instructed by your caregiver. Then begin use gradually, not increasing use to the point of pain. If pain does develop, decrease use and continue the above measures, gradually increasing activities that do not cause discomfort, until you gradually achieve normal use.  Use crutches if and as instructed, and for the length of time instructed.  Keep injured foot and ankle wrapped between treatments.  Massage foot and ankle for comfort and to keep swelling down. Massage from the toes up towards the knee.  Only take over-the-counter or prescription medicines for pain, discomfort, or fever as directed by your caregiver. SEEK IMMEDIATE MEDICAL CARE IF:   Your pain and swelling increase,  or pain is not controlled with medications.  You have loss of feeling in your foot or your foot turns cold or blue.  You develop new, unexplained symptoms, or an increase of the symptoms that brought you to your caregiver. MAKE SURE YOU:   Understand these instructions.  Will watch your condition.  Will get help right away if you are not  doing well or get worse. Document Released: 01/21/2002 Document Revised: 10/24/2011 Document Reviewed: 03/20/2008 [facility] Patient Information 2015 [place], [place]. This information is not intended to replace advice given to you by your health care provider. Make sure you discuss any questions you have with your health care provider.

## 2014-08-19 NOTE — ED Notes (Signed)

## 2014-08-19 NOTE — ED Notes (Signed)
Ortho tech at bedside 

## 2014-08-19 NOTE — ED Notes (Signed)
Patient transported to X-ray 

## 2014-08-19 NOTE — ED Notes (Signed)
Pt states she was at work and dropped a pallet on her right foot  Pt states her great toe and the 2nd and 3rd toe are painful  Pt has bruising noted

## 2014-10-06 ENCOUNTER — Emergency Department (HOSPITAL_COMMUNITY)
Admission: EM | Admit: 2014-10-06 | Discharge: 2014-10-06 | Disposition: A | Discharge status: Home / Self Care | Payer: Medicaid Other | Attending: Emergency Medicine | Admitting: Emergency Medicine

## 2014-10-06 ENCOUNTER — Encounter (HOSPITAL_COMMUNITY): Payer: Self-pay

## 2014-10-06 DIAGNOSIS — J069 Acute upper respiratory infection, unspecified: Secondary | ICD-10-CM | POA: Diagnosis not present

## 2014-10-06 DIAGNOSIS — Z79899 Other long term (current) drug therapy: Secondary | ICD-10-CM | POA: Insufficient documentation

## 2014-10-06 DIAGNOSIS — R05 Cough: Secondary | ICD-10-CM | POA: Diagnosis present

## 2014-10-06 DIAGNOSIS — K219 Gastro-esophageal reflux disease without esophagitis: Secondary | ICD-10-CM | POA: Insufficient documentation

## 2014-10-06 DIAGNOSIS — Z87448 Personal history of other diseases of urinary system: Secondary | ICD-10-CM | POA: Insufficient documentation

## 2014-10-06 DIAGNOSIS — Z87891 Personal history of nicotine dependence: Secondary | ICD-10-CM | POA: Diagnosis not present

## 2014-10-06 DIAGNOSIS — Z792 Long term (current) use of antibiotics: Secondary | ICD-10-CM | POA: Insufficient documentation

## 2014-10-06 DIAGNOSIS — B9789 Other viral agents as the cause of diseases classified elsewhere: Secondary | ICD-10-CM

## 2014-10-06 DIAGNOSIS — J988 Other specified respiratory disorders: Secondary | ICD-10-CM

## 2014-10-06 MED ORDER — HYDROCOD POLST-CHLORPHEN POLST 10-8 MG/5ML PO LQCR: 5.0000 mL | Freq: Two times a day (BID) | ORAL | Status: DC | PRN

## 2014-10-06 MED ORDER — ALBUTEROL SULFATE HFA 108 (90 BASE) MCG/ACT IN AERS
2.0000 | INHALATION_SPRAY | RESPIRATORY_TRACT | Status: DC | PRN
  Administered 2014-10-06: 2 via RESPIRATORY_TRACT
  Filled 2014-10-06: qty 6.7

## 2014-10-06 NOTE — ED Notes (Signed)
Generalized body aches, sore throat, cough

## 2014-10-06 NOTE — ED Provider Notes (Signed)
CSN: [id number]     Arrival date & time 10/06/14  0559 History   First MD Initiated Contact with Patient 10/06/14 [phone]     Chief Complaint  Patient presents with  . Cough     (Consider location/radiation/quality/duration/timing/severity/associated sxs/prior Treatment) HPI  This is a 34 year old female with a 24-hour history of headache, scratchy throat, cough and generalized body aches. The cough has been severe at times. She is out of her albuterol inhaler. She denies wheezing or shortness of breath. She denies nasal congestion or productive cough. She has not had nausea, vomiting or diarrhea. She denies fever but has had chills. She did get a flu shot this season.  Past Medical History  Diagnosis Date  . Abnormal Pap smear   . Endometrial hyperplasia without atypia, simple   . GERD (gastroesophageal reflux disease)     tums prn   Past Surgical History  Procedure Laterality Date  . Cesarean section      x3  . Diagnostic laparoscopy      endometriosis  . Cesarean section  04/02/2012    Procedure: CESAREAN SECTION;  Surgeon: Antionette CharLisa Jackson-Moore, MD;  Location: [facility];  Service: Obstetrics;  Laterality: N/A;  Repeat cesarean section with delivery of baby girl at [id number].   Family History  Problem Relation Age of Onset  . Hypertension Mother    History  Substance Use Topics  . Smoking status: Former Smoker -- 3 years    Types: Cigarettes    Quit date: 09/05/2007  . Smokeless tobacco: Never Used  . Alcohol Use: No     Comment: rare   OB History    Gravida Para Term Preterm AB TAB SAB Ectopic Multiple Living   3 3 1       3      Review of Systems  All other systems reviewed and are negative.   Allergies  Review of patient's allergies indicates no known allergies.  Home Medications   Prior to Admission medications   Medication Sig Start Date End Date Taking? Authorizing Provider  amphetamine-dextroamphetamine (ADDERALL XR) 20 MG 24 hr capsule Take 20 mg by mouth daily.    Yes Historical Provider, MD  acetaminophen (TYLENOL) 500 MG tablet Take 500-1,000 mg by mouth every 6 (six) hours as needed for mild pain or headache.    Historical Provider, MD  albuterol (PROVENTIL HFA;VENTOLIN HFA) 108 (90 BASE) MCG/ACT inhaler Inhale 2 puffs into the lungs every 4 (four) hours as needed for wheezing or shortness of breath. 11/24/13   Hayden Rasmussenavid Mabe, NP  amoxicillin (AMOXIL) 500 MG capsule Take 2 capsules (1,000 mg total) by mouth 3 (three) times daily. 05/03/14   Roma KayserKatherine P Schorr, NP  chlorpheniramine-HYDROcodone (TUSSIONEX PENNKINETIC ER) 10-8 MG/5ML LQCR Take 5 mLs by mouth every 12 (twelve) hours as needed for cough. 11/18/13   Linna HoffJames D Kindl, MD  chlorpheniramine-HYDROcodone (TUSSIONEX PENNKINETIC ER) 10-8 MG/5ML LQCR Take 5 mLs by mouth at bedtime as needed for cough. 05/03/14   Roma KayserKatherine P Schorr, NP  esomeprazole (NEXIUM) 40 MG capsule Take 40 mg by mouth every morning.    Historical Provider, MD  famotidine (PEPCID) 20 MG tablet Take 1 tablet (20 mg total) by mouth 2 (two) times daily. 07/02/13   Mora BellmanHannah S Merrell, PA-C  hyoscyamine (LEVSIN SL) 0.125 MG SL tablet Place 0.125 mg under the tongue every 4 (four) hours as needed for cramping (abdominal pain).    Historical Provider, MD  ipratropium (ATROVENT) 0.06 % nasal spray Place 2 sprays into both  nostrils 4 (four) times daily. 11/18/13   Linna Hoff, MD  methylPREDNISolone (MEDROL DOSEPAK) 4 MG tablet follow package directions. Take with food. Start 11/25/13 11/24/13   Hayden Rasmussen, NP  minocycline (MINOCIN,DYNACIN) 100 MG capsule Take 1 capsule (100 mg total) by mouth 2 (two) times daily. 11/18/13   Linna Hoff, MD  oxyCODONE-acetaminophen (PERCOCET/ROXICET) 5-325 MG per tablet Take 2 tablets by mouth every 4 (four) hours as needed for moderate pain or severe pain. 08/19/14   Hannah Muthersbaugh, PA-C  pantoprazole (PROTONIX) 20 MG tablet Take 1 tablet (20 mg total) by mouth 2 (two) times daily. 07/02/13   Mora Bellman, PA-C   BP  117/80 mmHg  Pulse 90  Temp(Src) 98.3 F (36.8 C) (Oral)  Resp 20  Ht  (1.727 m)  Wt 205 lb (92.987 kg)  BMI 31.18 kg/m2  SpO2 100%  LMP 09/22/2014  Physical Exam  General: Well-developed, well-nourished female in no acute distress; appearance consistent with age of record HENT: normocephalic; atraumatic; pharynx normal; no nasal congestion Eyes: pupils equal, round and reactive to light; extraocular muscles intact Neck: supple; no lymphadenopathy Heart: regular rate and rhythm Lungs: clear to auscultation bilaterally Abdomen: soft; nondistended Extremities: No deformity; full range of motion; pulses normal Neurologic: Awake, alert and oriented; motor function intact in all extremities and symmetric; no facial droop Skin: Warm and dry Psychiatric: Normal mood and affect    ED Course  Procedures (including critical care time)   MDM  Given patient's history of reactive airways disease in the setting of acute bronchitis we will provide her with an inhaler and treat with an antitussive.    Hanley Seamen, MD 10/06/14 [phone]

## 2014-12-08 ENCOUNTER — Emergency Department (HOSPITAL_COMMUNITY)
Admission: EM | Admit: 2014-12-08 | Discharge: 2014-12-08 | Disposition: A | Discharge status: Home / Self Care | Payer: MEDICAID | Attending: Emergency Medicine | Admitting: Emergency Medicine

## 2014-12-08 ENCOUNTER — Encounter (HOSPITAL_COMMUNITY): Payer: Self-pay | Admitting: Emergency Medicine

## 2014-12-08 DIAGNOSIS — Z79899 Other long term (current) drug therapy: Secondary | ICD-10-CM | POA: Insufficient documentation

## 2014-12-08 DIAGNOSIS — K219 Gastro-esophageal reflux disease without esophagitis: Secondary | ICD-10-CM | POA: Insufficient documentation

## 2014-12-08 DIAGNOSIS — Z8742 Personal history of other diseases of the female genital tract: Secondary | ICD-10-CM | POA: Diagnosis not present

## 2014-12-08 DIAGNOSIS — Z87891 Personal history of nicotine dependence: Secondary | ICD-10-CM | POA: Diagnosis not present

## 2014-12-08 DIAGNOSIS — F410 Panic disorder [episodic paroxysmal anxiety] without agoraphobia: Secondary | ICD-10-CM | POA: Insufficient documentation

## 2014-12-08 MED ORDER — LORAZEPAM 1 MG PO TABS
1.0000 mg | ORAL_TABLET | Freq: Once | ORAL | Status: AC
  Administered 2014-12-08: 1 mg via ORAL
  Filled 2014-12-08: qty 1

## 2014-12-08 MED ORDER — LORAZEPAM 1 MG PO TABS: 1.0000 mg | ORAL_TABLET | Freq: Three times a day (TID) | ORAL | Status: DC | PRN

## 2014-12-08 NOTE — Discharge Instructions (Signed)
Panic Attacks °Panic attacks are sudden, short-lived surges of severe anxiety, fear, or discomfort. They may occur for no reason when you are relaxed, when you are anxious, or when you are sleeping. Panic attacks may occur for a number of reasons:  °· Healthy people occasionally have panic attacks in extreme, life-threatening situations, such as war or natural disasters. Normal anxiety is a protective mechanism of the body that helps us react to danger (fight or flight response). °· Panic attacks are often seen with anxiety disorders, such as panic disorder, social anxiety disorder, generalized anxiety disorder, and phobias. Anxiety disorders cause excessive or uncontrollable anxiety. They may interfere with your relationships or other life activities. °· Panic attacks are sometimes seen with other mental illnesses, such as depression and posttraumatic stress disorder. °· Certain medical conditions, prescription medicines, and drugs of abuse can cause panic attacks. °SYMPTOMS  °Panic attacks start suddenly, peak within 20 minutes, and are accompanied by four or more of the following symptoms: °· Pounding heart or fast heart rate (palpitations). °· Sweating. °· Trembling or shaking. °· Shortness of breath or feeling smothered. °· Feeling choked. °· Chest pain or discomfort. °· Nausea or strange feeling in your stomach. °· Dizziness, light-headedness, or feeling like you will faint. °· Chills or hot flushes. °· Numbness or tingling in your lips or hands and feet. °· Feeling that things are not real or feeling that you are not yourself. °· Fear of losing control or going crazy. °· Fear of dying. °Some of these symptoms can mimic serious medical conditions. For example, you may think you are having a heart attack. Although panic attacks can be very scary, they are not life threatening. °DIAGNOSIS  °Panic attacks are diagnosed through an assessment by your health care provider. Your health care provider will ask  questions about your symptoms, such as where and when they occurred. Your health care provider will also ask about your medical history and use of alcohol and drugs, including prescription medicines. Your health care provider may order blood tests or other studies to rule out a serious medical condition. Your health care provider may refer you to a mental health professional for further evaluation. °TREATMENT  °· Most healthy people who have one or two panic attacks in an extreme, life-threatening situation will not require treatment. °· The treatment for panic attacks associated with anxiety disorders or other mental illness typically involves counseling with a mental health professional, medicine, or a combination of both. Your health care provider will help determine what treatment is best for you. °· Panic attacks due to physical illness usually go away with treatment of the illness. If prescription medicine is causing panic attacks, talk with your health care provider about stopping the medicine, decreasing the dose, or substituting another medicine. °· Panic attacks due to alcohol or drug abuse go away with abstinence. Some adults need professional help in order to stop drinking or using drugs. °HOME CARE INSTRUCTIONS  °· Take all medicines as directed by your health care provider.   °· Schedule and attend follow-up visits as directed by your health care provider. It is important to keep all your appointments. °SEEK MEDICAL CARE IF: °· You are not able to take your medicines as prescribed. °· Your symptoms do not improve or get worse. °SEEK IMMEDIATE MEDICAL CARE IF:  °· You experience panic attack symptoms that are different than your usual symptoms. °· You have serious thoughts about hurting yourself or others. °· You are taking medicine for panic attacks and   have a serious side effect. °MAKE SURE YOU: °· Understand these instructions. °· Will watch your condition. °· Will get help right away if you are not  doing well or get worse. °Document Released: 08/01/2005 Document Revised: 08/06/2013 Document Reviewed: 03/15/2013 °ExitCare® Patient Information ©2015 [employer]. This information is not intended to replace advice given to you by your health care provider. Make sure you discuss any questions you have with your health care provider. ° °Emergency Department Resource Guide °1) Find a Doctor and Pay Out of Pocket °Although you won't have to find out who is covered by your insurance plan, it is a good idea to ask around and get recommendations. You will then need to call the office and see if the doctor you have chosen will accept you as a new patient and what types of options they offer for patients who are self-pay. Some doctors offer discounts or will set up payment plans for their patients who do not have insurance, but you will need to ask so you aren't surprised when you get to your appointment. ° °2) Contact Your Local Health Department °Not all health departments have doctors that can see patients for sick visits, but many do, so it is worth a call to see if yours does. If you don't know where your local health department is, you can check in your phone book. The CDC also has a tool to help you locate your state's health department, and many state websites also have listings of all of their local health departments. ° °3) Find a Walk-in Clinic °If your illness is not likely to be very severe or complicated, you may want to try a walk in clinic. These are popping up all over the country in pharmacies, drugstores, and shopping centers. They're usually staffed by nurse practitioners or physician assistants that have been trained to treat common illnesses and complaints. They're usually fairly quick and inexpensive. However, if you have serious medical issues or chronic medical problems, these are probably not your best option. ° °No Primary Care Doctor: °- Call [facility] at  832-8000 - they can help you  locate a primary care doctor that  accepts your insurance, provides certain services, etc. °- Physician Referral Service- 1-800-533-3463 ° °Chronic Pain Problems: °Organization         Address  Phone   Notes  °[facility]  (336) 297-2271 Patients need to be referred by their primary care doctor.  ° °Medication Assistance: °Organization         Address  Phone   Notes  °[place] [facility] [address] °[place], [place] [address] (336) 641-8030 --Must be a resident of [place] °-- Must have NO insurance coverage whatsoever (no Medicaid/ Medicare, etc.) °-- The pt. MUST have a primary care doctor that directs their care regularly and follows them in the community °  °[facility]  (866) 331-1348   °[employer]  (888) 892-1162   ° °Agencies that provide inexpensive medical care: °Organization         Address  Phone   Notes  °[facility]  (336) 832-8035   °[facility]    (336) 832-7272   °[facility] [address] °[place], [place] [address] (336) 832-4777   °[facility] [address], °[place] (336) 271-4999   °[facility]    (336) 373-0678   °[facility]    (336) 272-1050   °  [facility] ° [address], [place] Phone:  (336) 832-4444, Fax:  (336) 832-4440 Hours of Operation:  9 am - 6 pm, M-F.  Also accepts Medicaid/Medicare and self-pay.  °[facility] ° [address], [place] Phone: (336) 832-3150, Fax: (336) [phone]. Hours of Operation:  8:30 am - 5:30 pm, M-F.  Also accepts Medicaid and self-pay.  °[facility] [place] [address], [place] Phone: (336) 878-6027   °[facility] [address], [place], [place] (336)723-1848, Ext. 123 Mondays & Thursdays: 7-9 AM.  First 15 patients are seen on a first come, first serve basis. °  ° °Medicaid-accepting [place]  Providers: ° °Organization         Address  Phone   Notes  °[facility] [address], [place] (336) 641-2100 Also accepts self-pay patients.  °[facility] [address], [place] ° (336) 856-9996   °[facility] [address], [place] (336) 288-8857   °[facility] [address] [place] [address], [place] (336) 299-7000   °Veita Bland [address], [place]  ° (336) 373-1557 Only accepts [place] Access Medicaid patients after they have their name applied to their card.  ° °Self-Pay (no insurance) in [place]: ° °Organization         Address  Phone   Notes  °Sickle Cell Patients, [facility] [address], [place] (336) 832-1970   °[facility] [address], [place] (336) 832-4400   °[facility] ° [address], [place] (336) 992-4800   °[facility]/Dr. Osei-Bonsu ° [address] [place] [address], [place] or [address], [place] (336) 841-8500 Phone number for both [place] and [place] locations is the same.  °[facility] [address], [place] (336) 299-0000   °[facility] [address] [place] [address], [place] or [address] (336) 852-7530 °(336) 878-2260   °[facility] [address], [place] (336) 350-1642, phone; (336) 294-5005, fax Sees patients 1st and 3rd Saturday of every month.  Must not qualify for public or private insurance (i.e. Medicaid, Medicare, [place] Health Choice, Veterans' Benefits) • Household income should be no more than 200% of the poverty level •The clinic cannot treat you if you are pregnant or think you are pregnant • Sexually transmitted diseases are not treated at the clinic.  ° ° °Dental Care: °Organization         Address  Phone  Notes  °[facility] [address], [place] (336) 641-6152 Accepts children up to age 21 who are enrolled in Medicaid or [place] Health Choice; pregnant women with a Medicaid card; and children who have applied for Medicaid or [place] Health Choice, but were declined, whose parents can pay a reduced fee at time of service.  °[facility] [place]  [address], [place] (336) 641-7733 Accepts children up to age 21 who are enrolled in Medicaid or [place] Health Choice; pregnant women with a Medicaid card; and children who have applied for Medicaid or [place] Health Choice, but were declined, whose parents can pay a reduced fee at time of service.  °[facility] ° [address], [place] (336) 641-4533 Patients are seen by appointment only. Walk-ins are not accepted. [facility] will see patients 18 years of age and older. °Monday - Tuesday (8am-5pm) °Most Wednesdays (8:30-5pm) °$30 per visit, cash only  °[facility] ° [address], [place] (336) 641-4533 Patients are seen by appointment only. Walk-ins are not accepted. [facility] will see patients 18 years of age and older. °One Wednesday Evening (Monthly: Volunteer Based).  $30 per visit, cash only  °[facility]  (919) 537-3737 for adults; Children under age 4, call Graduate Pediatric Dentistry at (919) [phone]. Children aged 4-14, please call (919) [phone] to request a pediatric application. ° Dental services are provided in all areas of dental care including fillings, crowns and bridges, complete and partial dentures, implants, gum treatment, root canals, and extractions. Preventive care is also provided. Treatment is provided to both adults and children. °Patients are selected via a lottery and there is often a waiting list. °  °[facility] [address], °[place] ° (336) 763-8833 [email or web address] °  °[facility]  [address], [place], [place] (336)723-1848, Ext. 123 Second and Fourth Thursday of each month, opens at 6:30 AM; Clinic ends at 9 AM.  Patients are seen on a first-come first-served basis, and a limited number are seen during each clinic.  ° °[facility] ° [address], [place], [place] (336) 723-7904   Eligibility Requirements °You must have lived in [place], [place], or [place] counties for at least the last three months. °  You cannot be eligible for state or federal sponsored healthcare insurance, including Veterans Administration, Medicaid, or Medicare. °  You generally cannot be eligible for healthcare insurance through your employer.  °  How to apply: °Eligibility screenings are held every Tuesday and Wednesday afternoon from 1:00 pm until 4:00 pm. You do not need an appointment for the interview!  °[facility] [address], [place], [place] 336-631-2330   °[facility]  336-342-8273   °[facility]  336-703-3100   °[facility]  336-570-6415   ° °Behavioral Health Resources in the Community: °Intensive Outpatient Programs °Organization         Address  Phone  Notes  °[facility] [address], [place], [employer] 336-878-6098   °[facility] [address], [place], [place] 336-832-9800   °[facility] [address], [place], [place] ° 336-882-2125   °[facility] [address],  °[place], [place] 1-800-853-5163 or 336-641-4981   °Substance Abuse Resources °Organization         Address  Phone  Notes  °[facility]  336-882-2125   °[facility]  336-784-9470   °[facility]  336-285-9073   °[facility]  336-845-3988   °Residential & Outpatient Substance Abuse Program  1-800-659-3381   °Psychological Services °Organization         Address  Phone  Notes  ° Health  336- 832-9600     °[facility]  336- 378-7881   °[facility] [address], [place] 1-800-853-5163 or 336-641-4981   ° °Mobile Crisis Teams °Organization         Address  Phone  Notes  °[facility], Mobile Crisis Care Unit  1-877-626-1772   °Assertive °[facility] ° [address] [place],  336-834-9664   °  Sharon DeEsch [address] °[place] 336-554-5454   ° °Self-Help/Support Groups °Organization         Address  Phone             Notes  °[facility] - variety of support groups  336- 373-1402 Call for more information  °Narcotics Anonymous (NA), [facility] [address], °[place]  2 meetings at this location  ° °Residential Treatment Programs °Organization         Address  Phone  Notes  °[facility] [address],    °[place]  1-866-801-8205   °[facility] ° [address], [place], [place] 704-293-8524   °[facility] [address], [place] 336-845-3988 Admissions: 8am-3pm M-F  °[facility] [address]    °[place], [place] 336-841-1104   °[facility] [address], [place], [place] 336-379-7146   °[facility] [address]  °[place], [place] 336-285-9073   °[facility] Intensive Outpatient [address], [place], [place] 336-852-3033   °[facility] [address]  °[place], [place] 1-877-615-2722 or 336-784-9470   °[facility] [address] [place], [place] 336-227-7417 Accepts Medicaid  °[facility] [address]  °[place] 1-800-659-3381 Substance Abuse/Addiction Treatment  ° °[place] Behavioral Health Resources °Organization         Address  Phone  Notes  °[facility]  (888) 581-9988   °Julie Brannon, PhD [address] [place], [place]   (336) 349-5553 or (336) 951-0000    °[facility]   [address] °[place], [place] (336) 349-4454   °[facility] [address], [place], [place] (336) 342-8316 Insurance/Medicaid/sponsorship through [facility]  °[facility] [address]                                    [place], [place] (336) 342-8316 Therapy/tele-psych/case  °[facility] [address]  ° [place], [place] (336) 349-2233    °Dr. Arfeen  (336) 349-4544   °[facility] [place]  [employer] [place] [facility] 1) [address], [place] °2) [address], [place] °3)  [address], [place] (336) 349-3220 °(336) 342-7768 ° °(336) 342-8140   °[place] [facility] (336) 342-1394 or (336) 342-3537 (After Hours)    ° ° ° °

## 2014-12-08 NOTE — ED Notes (Signed)
Pt reports high anxiety with palpitations, sensation of her throat closing, SOB. Pt states she has been dealing with a lot of difficulties at work.

## 2014-12-08 NOTE — ED Provider Notes (Signed)
TIME SEEN: 9:15 PM  CHIEF COMPLAINT: Panic attacks  HPI: Pt is a 34 y.o. female with history of depression and anxiety who was previously on Celexa, Klonopin and Xanax who presents to the emergency department with panic attacks intermittently for the past week. Report she is in the [employer], is also nursing school and has had lots of personal issues going on causing her to feel anxious. She states when she has her panic attack she feels like her heart is racing feels that her throat is closing and feel short of breath. States this feels exactly like her prior panic attacks. No SI or HI. No hallucinations. Denies any chest pain or shortness of breath currently. No vomiting or diarrhea. No bloody stool or melena. Last menstrual period was 3 weeks ago. Denies any history of CAD, hypertension, diabetes, hyperlipidemia, tobacco use, premature family history of CAD. No history of PE or DVT, recent prolonged immobilization such as long flight or hospitalization, fracture, surgery, trauma. Not on exogenous estrogen.  ROS: See HPI Constitutional: no fever  Eyes: no drainage  ENT: no runny nose   Cardiovascular:  No chest pain  Resp:  SOB  GI: no vomiting GU: no dysuria Integumentary: no rash  Allergy: no hives  Musculoskeletal: no leg swelling  Neurological: no slurred speech ROS otherwise negative  PAST MEDICAL HISTORY/PAST SURGICAL HISTORY:  Past Medical History  Diagnosis Date  . Abnormal Pap smear   . Endometrial hyperplasia without atypia, simple   . GERD (gastroesophageal reflux disease)     tums prn    MEDICATIONS:  Prior to Admission medications   Medication Sig Start Date End Date Taking? Authorizing Provider  acetaminophen (TYLENOL) 500 MG tablet Take 500-1,000 mg by mouth every 6 (six) hours as needed for mild pain or headache.    Historical Provider, MD  amphetamine-dextroamphetamine (ADDERALL XR) 20 MG 24 hr capsule Take 20 mg by mouth daily.    Historical Provider, MD   chlorpheniramine-HYDROcodone (TUSSIONEX PENNKINETIC ER) 10-8 MG/5ML LQCR Take 5 mLs by mouth every 12 (twelve) hours as needed for cough. 10/06/14   John Molpus, MD  esomeprazole (NEXIUM) 40 MG capsule Take 40 mg by mouth every morning.    Historical Provider, MD  hyoscyamine (LEVSIN SL) 0.125 MG SL tablet Place 0.125 mg under the tongue every 4 (four) hours as needed for cramping (abdominal pain).    Historical Provider, MD    ALLERGIES:  No Known Allergies  SOCIAL HISTORY:  History  Substance Use Topics  . Smoking status: Former Smoker -- 3 years    Types: Cigarettes    Quit date: 09/05/2007  . Smokeless tobacco: Never Used  . Alcohol Use: No     Comment: rare    FAMILY HISTORY: Family History  Problem Relation Age of Onset  . Hypertension Mother     EXAM: BP 135/79 mmHg  Pulse 106  Temp(Src) 98.8 F (37.1 C) (Oral)  Resp 16  Ht 5\' 8"  (1.727 m)  Wt 206 lb (93.441 kg)  BMI 31.33 kg/m2  SpO2 99%  LMP 11/17/2014 CONSTITUTIONAL: Alert and oriented and responds appropriately to questions. Well-appearing; well-nourished HEAD: Normocephalic EYES: Conjunctivae clear, PERRL ENT: normal nose; no rhinorrhea; moist mucous membranes; pharynx without lesions noted NECK: Supple, no meningismus, no LAD  CARD: RRR; S1 and S2 appreciated; no murmurs, no clicks, no rubs, no gallops RESP: Normal chest excursion without splinting or tachypnea; breath sounds clear and equal bilaterally; no wheezes, no rhonchi, no rales, no hypoxia or respiratory distress,  speaking full sentences ABD/GI: Normal bowel sounds; non-distended; soft, non-tender, no rebound, no guarding BACK:  The back appears normal and is non-tender to palpation, there is no CVA tenderness EXT: Normal ROM in all joints; non-tender to palpation; no edema; normal capillary refill; no cyanosis, no calf tenderness or swelling    SKIN: Normal color for age and race; warm NEURO: Moves all extremities equally PSYCH: Appears anxious.  No HI or SI. Grooming and personal hygiene are appropriate.  MEDICAL DECISION MAKING: Patient here with panic attacks. We'll discharge home with prescription of Ativan. EKG shows no ischemic changes, interval changes or arrhythmia. Her heart score is 0.  She is PERC negative.  She is not having chest pain or shortness of breath currently. I do not feel she needs any emergent labs, imaging at this time. She has no current psychiatric safety concerns. We'll provide her with outpatient follow-up information and prescription for Ativan to use as needed. Discussed return precautions. She verbalized understanding and is comfortable with this plan.   EKG Interpretation  Date/Time:  Monday December 08 2014 21:29:09 EDT Ventricular Rate:  74 PR Interval:  154 QRS Duration: 81 QT Interval:  396 QTC Calculation: 439 R Axis:   62 Text Interpretation:  Sinus rhythm No significant change since last tracing Confirmed by WARD,  DO, KRISTEN [phone]) on 12/08/2014 9:34:21 PM          Layla Maw Ward, DO 12/08/14 2136

## 2015-01-12 ENCOUNTER — Emergency Department (HOSPITAL_COMMUNITY): Payer: Medicaid Other

## 2015-01-12 ENCOUNTER — Emergency Department (HOSPITAL_COMMUNITY)
Admission: EM | Admit: 2015-01-12 | Discharge: 2015-01-12 | Disposition: A | Discharge status: Home / Self Care | Payer: Medicaid Other | Attending: Emergency Medicine | Admitting: Emergency Medicine

## 2015-01-12 ENCOUNTER — Encounter (HOSPITAL_COMMUNITY): Payer: Self-pay | Admitting: *Deleted

## 2015-01-12 DIAGNOSIS — R1011 Right upper quadrant pain: Secondary | ICD-10-CM

## 2015-01-12 DIAGNOSIS — K219 Gastro-esophageal reflux disease without esophagitis: Secondary | ICD-10-CM | POA: Insufficient documentation

## 2015-01-12 DIAGNOSIS — R1031 Right lower quadrant pain: Secondary | ICD-10-CM | POA: Insufficient documentation

## 2015-01-12 DIAGNOSIS — Z3202 Encounter for pregnancy test, result negative: Secondary | ICD-10-CM | POA: Diagnosis not present

## 2015-01-12 DIAGNOSIS — R079 Chest pain, unspecified: Secondary | ICD-10-CM | POA: Insufficient documentation

## 2015-01-12 DIAGNOSIS — Z87891 Personal history of nicotine dependence: Secondary | ICD-10-CM | POA: Insufficient documentation

## 2015-01-12 DIAGNOSIS — R109 Unspecified abdominal pain: Secondary | ICD-10-CM

## 2015-01-12 DIAGNOSIS — Z87448 Personal history of other diseases of urinary system: Secondary | ICD-10-CM | POA: Diagnosis not present

## 2015-01-12 LAB — COMPREHENSIVE METABOLIC PANEL
ALBUMIN: 3.7 g/dL (ref 3.5–5.0)
ALK PHOS: 69 U/L (ref 38–126)
ALT: 21 U/L (ref 14–54)
AST: 19 U/L (ref 15–41)
Anion gap: 7 (ref 5–15)
BILIRUBIN TOTAL: 0.5 mg/dL (ref 0.3–1.2)
BUN: 9 mg/dL (ref 6–20)
CALCIUM: 8.5 mg/dL — AB (ref 8.9–10.3)
CHLORIDE: 103 mmol/L (ref 101–111)
CO2: 28 mmol/L (ref 22–32)
CREATININE: 0.84 mg/dL (ref 0.44–1.00)
GFR calc non Af Amer: >60 mL/min (ref >60)
Glucose, Bld: 88 mg/dL (ref 65–99)
POTASSIUM: 3.8 mmol/L (ref 3.5–5.1)
Sodium: 138 mmol/L (ref 135–145)
Total Protein: 6.4 g/dL — ABNORMAL LOW (ref 6.5–8.1)

## 2015-01-12 LAB — URINALYSIS, ROUTINE W REFLEX MICROSCOPIC
Bilirubin Urine: NEGATIVE
Glucose, UA: NEGATIVE mg/dL
HGB URINE DIPSTICK: NEGATIVE
Ketones, ur: NEGATIVE mg/dL
LEUKOCYTES UA: NEGATIVE
NITRITE: NEGATIVE
Protein, ur: NEGATIVE mg/dL
Specific Gravity, Urine: >1.03 — ABNORMAL HIGH (ref 1.005–1.030)
Urobilinogen, UA: 0.2 mg/dL (ref 0.0–1.0)
pH: 5.5 (ref 5.0–8.0)

## 2015-01-12 LAB — CBC WITH DIFFERENTIAL/PLATELET
BASOS PCT: 0 % (ref 0–1)
Basophils Absolute: 0 10*3/uL (ref 0.0–0.1)
Eosinophils Absolute: 0.1 10*3/uL (ref 0.0–0.7)
Eosinophils Relative: 2 % (ref 0–5)
HEMATOCRIT: 34.7 % — AB (ref 36.0–46.0)
HEMOGLOBIN: 11.7 g/dL — AB (ref 12.0–15.0)
LYMPHS PCT: 31 % (ref 12–46)
Lymphs Abs: 2.1 10*3/uL (ref 0.7–4.0)
MCH: 30.5 pg (ref 26.0–34.0)
MCHC: 33.7 g/dL (ref 30.0–36.0)
MCV: 90.4 fL (ref 78.0–100.0)
MONOS PCT: 5 % (ref 3–12)
Monocytes Absolute: 0.3 10*3/uL (ref 0.1–1.0)
NEUTROS ABS: 4.1 10*3/uL (ref 1.7–7.7)
NEUTROS PCT: 62 % (ref 43–77)
Platelets: 262 10*3/uL (ref 150–400)
RBC: 3.84 MIL/uL — ABNORMAL LOW (ref 3.87–5.11)
RDW: 12.6 % (ref 11.5–15.5)
WBC: 6.7 10*3/uL (ref 4.0–10.5)

## 2015-01-12 LAB — PREGNANCY, URINE: PREG TEST UR: NEGATIVE

## 2015-01-12 LAB — LIPASE, BLOOD: Lipase: 26 U/L (ref 22–51)

## 2015-01-12 MED ORDER — HYDROMORPHONE HCL 1 MG/ML IJ SOLN
1.0000 mg | Freq: Once | INTRAMUSCULAR | Status: AC
  Administered 2015-01-12: 1 mg via INTRAMUSCULAR
  Filled 2015-01-12: qty 1

## 2015-01-12 MED ORDER — OXYCODONE-ACETAMINOPHEN 5-325 MG PO TABS
2.0000 | ORAL_TABLET | Freq: Once | ORAL | Status: AC
  Administered 2015-01-12: 2 via ORAL
  Filled 2015-01-12: qty 2

## 2015-01-12 MED ORDER — ONDANSETRON HCL 4 MG PO TABS: 4.0000 mg | ORAL_TABLET | Freq: Three times a day (TID) | ORAL | Status: DC | PRN

## 2015-01-12 MED ORDER — OXYCODONE-ACETAMINOPHEN 5-325 MG PO TABS: ORAL_TABLET | ORAL | Status: DC

## 2015-01-12 NOTE — Discharge Instructions (Signed)
°Emergency Department Resource Guide °1) Find a Doctor and Pay Out of Pocket °Although you won't have to find out who is covered by your insurance plan, it is a good idea to ask around and get recommendations. You will then need to call the office and see if the doctor you have chosen will accept you as a new patient and what types of options they offer for patients who are self-pay. Some doctors offer discounts or will set up payment plans for their patients who do not have insurance, but you will need to ask so you aren't surprised when you get to your appointment. ° °2) Contact Your Local Health Department °Not all health departments have doctors that can see patients for sick visits, but many do, so it is worth a call to see if yours does. If you don't know where your local health department is, you can check in your phone book. The CDC also has a tool to help you locate your state's health department, and many state websites also have listings of all of their local health departments. ° °3) Find a Walk-in Clinic °If your illness is not likely to be very severe or complicated, you may want to try a walk in clinic. These are popping up all over the country in pharmacies, drugstores, and shopping centers. They're usually staffed by nurse practitioners or physician assistants that have been trained to treat common illnesses and complaints. They're usually fairly quick and inexpensive. However, if you have serious medical issues or chronic medical problems, these are probably not your best option. ° °No Primary Care Doctor: °- Call [facility] at  832-8000 - they can help you locate a primary care doctor that  accepts your insurance, provides certain services, etc. °- Physician Referral Service- 1-800-533-3463 ° °Chronic Pain Problems: °Organization         Address  Phone   Notes  °[facility]  ([phone]) [phone] Patients need to be referred by their primary care doctor.  ° °Medication  Assistance: °Organization         Address  Phone   Notes  °[place] [facility] [address] °[place], [place] [address] (336) 641-8030 --Must be a resident of [place] °-- Must have NO insurance coverage whatsoever (no Medicaid/ Medicare, etc.) °-- The pt. MUST have a primary care doctor that directs their care regularly and follows them in the community °  °[facility]  (866) 331-1348   °[employer]  (888) 892-1162   ° °Agencies that provide inexpensive medical care: °Organization         Address  Phone   Notes  °[facility]  (336) 832-8035   °[facility]    (336) 832-7272   °[facility] [address] °[place], [place] [address] (336) 832-4777   °[facility] [address], °[place] (336) 271-4999   °[facility]    (336) 373-0678   °[facility]    (336) 272-1050   °[facility] ° [address], [place] Phone:  (336) 832-4444, Fax:  (336) 832-4440 Hours of Operation:  9 am - 6 pm, M-F.  Also accepts Medicaid/Medicare and self-pay.  °[facility] ° [address], [place] Phone: (336) [phone], Fax: (336) [phone]. Hours of Operation:  8:30 am - 5:30 pm, M-F.  Also accepts Medicaid and self-pay.  °[facility] [place] [address], [place] Phone: (336) 878-6027   °[facility] [address], [place], [place] (336)723-1848, Ext. 123 Mondays & Thursdays: 7-9 AM.  First 15 patients are seen on a first come, first serve basis. °  ° °Medicaid-accepting [place] Providers: ° °Organization         Address  Phone   Notes  °[facility] [address], [place] (336) 641-2100 Also accepts self-pay patients.  °[facility] [address], [place] ° (336) 856-9996   °[facility] [address], [place]  (336) 288-8857   °[facility] [address] [place] [address], [place] (336) 299-7000   °Veita Bland [address], [place]  ° (336) 373-1557 Only accepts [place] Access Medicaid patients after they have their name applied to their card.  ° °Self-Pay (no insurance) in [place]: ° °Organization         Address  Phone   Notes  °Sickle Cell Patients, [facility] [address], [place] (336) 832-1970   °[facility] [address], [place] (336) 832-4400   °[facility] ° [address], [place] (336) 992-4800   °[facility]/Dr. Osei-Bonsu ° [address] [place] [address], [place] or [address], [place] (336) [phone] Phone number for both [place] and [place] locations is the same.  °[facility] [address], [place] (336) 299-0000   °[facility] [address] [place] [address], [place] or [address] (336) 852-7530 °(336) 878-2260   °[facility] [address], [place] (336) 350-1642, phone; (336) 294-5005, fax Sees patients 1st and 3rd Saturday of every month.  Must not qualify for public or private insurance (i.e. Medicaid, Medicare, [place] Health Choice, Veterans' Benefits) • Household income should be no more than 200% of the poverty level •The clinic cannot treat you if you are pregnant or think you are pregnant • Sexually transmitted diseases are not treated at the clinic.  ° ° °Dental Care: °Organization         Address  Phone  Notes  °[place] [facility] [address], [place] (336) 641-6152 Accepts children up to age 21 who are enrolled in Medicaid or [place] Health Choice; pregnant women with a Medicaid card; and children who have applied for Medicaid or [place] Health Choice, but were declined, whose parents can pay a reduced fee at time of service.  °[place]  [facility] [place]  [address], [place] (336) 641-7733 Accepts children up to age 21 who are enrolled in Medicaid or [place] Health Choice; pregnant women with a Medicaid card; and children who have applied for Medicaid or [place] Health Choice, but were declined, whose parents can pay a reduced fee at time of service.  °[facility] ° [address], [place] (336) 641-4533 Patients are seen by appointment only. Walk-ins are not accepted. [facility] will see patients 18 years of age and older. °Monday - Tuesday (8am-5pm) °Most Wednesdays (8:30-5pm) °$30 per visit, cash only  °[facility] ° [address], [place] (336) 641-4533 Patients are seen by appointment only. Walk-ins are not accepted. [facility] will see patients 18 years of age and older. °One   Wednesday Evening (Monthly: Volunteer Based).  $30 per visit, cash only  °[facility]  (919) 537-3737 for adults; Children under age 4, call Graduate Pediatric Dentistry at ([phone]) [phone]. Children aged 4-14, please call (919) [phone] to request a pediatric application. ° Dental services are provided in all areas of dental care including fillings, crowns and bridges, complete and partial dentures, implants, gum treatment, root canals, and extractions. Preventive care is also provided. Treatment is provided to both adults and children. °Patients are selected via a lottery and there is often a waiting list. °  °[facility] [address], °[place] ° (336) 763-8833 [email or web address] °  °[facility] [address], [place], [place] (336)723-1848, Ext. 123 Second and Fourth Thursday of each month, opens at 6:30 AM; Clinic ends at 9 AM.  Patients are seen on a first-come first-served basis, and a limited number are seen during each clinic.  ° °[facility] ° [address], [place], [place] (336) 723-7904    Eligibility Requirements °You must have lived in [place], [place], or [place] counties for at least the last three months. °  You cannot be eligible for state or federal sponsored healthcare insurance, including Veterans Administration, Medicaid, or Medicare. °  You generally cannot be eligible for healthcare insurance through your employer.  °  How to apply: °Eligibility screenings are held every Tuesday and Wednesday afternoon from 1:00 pm until 4:00 pm. You do not need an appointment for the interview!  °[facility] [address], [place], [place] 336-631-2330   °[facility]  336-342-8273   °[facility]  336-703-3100   °[facility]  336-570-6415   ° °Behavioral Health Resources in the Community: °Intensive Outpatient Programs °Organization         Address  Phone  Notes  °[facility] [address], [place], [place] 336-878-6098   °[facility] [address], [place], [place] 336-832-9800   °[facility] [address], [place], [place] ° 336-882-2125   °[facility] [address],  °[place], [place] 1-800-853-5163 or 336-641-4981   °Substance Abuse Resources °Organization         Address  Phone  Notes  °[facility]  336-882-2125   °[facility]  336-784-9470   °[facility]  336-285-9073   °[facility]  336-845-3988   °Residential & Outpatient Substance Abuse Program  1-800-659-3381   °Psychological Services °Organization         Address  Phone  Notes  °[facility]  336- 832-9600   °[facility]  336- 378-7881   °[facility] [address], [place] 1-800-853-5163 or 336-641-4981   ° °Mobile Crisis Teams °Organization         Address  Phone  Notes  °[facility], Mobile Crisis Care Unit  1-877-626-1772   °Assertive °[facility] ° [address]  [place], [place] 336-834-9664   °Sharon DeEsch [address] °[place] 336-554-5454   ° °Self-Help/Support Groups °Organization         Address  Phone             Notes  °[facility]  - variety of support groups  336- 373-1402 Call for more information  °Narcotics Anonymous (NA), [facility] [address], °[place]  2 meetings at this location  ° °  Residential Treatment Programs Organization         Address  Phone  Notes  [facility] [address],    [place]  [phone]-(540) 007-6348   [facility]  [address], [place] [phone], [place], [place] [phone]   [facility] [address], [place] [phone] Admissions: 8am-3pm M-F  [facility] [address]    [place], [place] [phone]) [phone]   [facility] [address], [place], [place] [phone]   [facility] [address]  [place], [place] [phone]   [facility] Intensive Outpatient [address], [place], [place] [phone]   [facility] [address]  [place], [place] [phone] or (631) 856-0957(306)[phone]   [facility] [address] [place], [place] [phone] Accepts Medicaid  [facility] [address]  [place] [phone] Substance Abuse/Addiction Treatment   [facility] Resources Organization         Address  Phone  Notes  [facility]  737-602-4893(888) 636-444-8003   Angie FavaJulie Brannon, PhD [address], [address] [place], [place]   [phone](336) [phone] or [phone](336) [phone]   [facility]   [address] [place], [place] [phone](336) [phone]   [facility] [address], [place], [place] [phone](336) [phone] Insurance/Medicaid/sponsorship through [facility] [address]                                    [place], [place] [phone](336) [phone] Therapy/tele-psych/case    [facility] [address]   [place], [place] [phone](336) [phone]    Dr. Lolly MustacheArfeen  [phone]) [phone]   [facility]  [employer] [facility] 1) [address], [place] 2) [address], [place] 3)  [address], [place] ([phone](336) [phone](336) [phone]  [phone](336) ([phone]   [facility] Child [facility] Hot[facility] 667-307-4493(336) 934-885-5260 or (864) [phone](336) (501)[phone] (After Hours)      Eat a bland diet, avoiding greasy, fatty, fried foods, as well as spicy and acidic foods or beverages.  Avoid eating within the hour or 2 before going to bed or laying down.  Also avoid teas, colas, coffee, chocolate, pepermint and spearment.  Take over the counter pepcid, one tablet by mouth twice a day, for the next 3 to 4 weeks.  May also take over the counter maalox/mylanta, as directed on packaging, as needed for discomfort.  Take the prescriptions as directed.  Call your regular medical doctor and the GI doctor tomorrow to schedule a follow up appointment within the next week.  Return to the Emergency Department immediately if worsening.

## 2015-01-12 NOTE — ED Provider Notes (Signed)
CSN: [id number]     Arrival date & time 01/12/15  4098 History   First MD Initiated Contact with Patient 01/12/15 [phone]     Chief Complaint  Patient presents with  . Chest Pain     onset  was yesterday while laying down. hurts to take a deep breathe per patient  . Abdominal Pain      HPI Pt was seen at 0725.  Per pt, c/o gradual onset and persistence of constant RUQ abd "pain" since yesterday.  Has been associated with "a lot of bleching" and "bloating."  Describes the abd pain as "sharp." Pain worsens after eating. Occasionally radiates into right flank. Pt states she has hx of "gallbladder spasms" and questions if this is the cause of her symptoms.  Denies N/V, no diarrhea, no fevers, no back pain, no rash, no CP/SOB, no black or blood in stools.      Past Medical History  Diagnosis Date  . Abnormal Pap smear   . Endometrial hyperplasia without atypia, simple   . GERD (gastroesophageal reflux disease)     tums prn   Past Surgical History  Procedure Laterality Date  . Cesarean section      x3  . Diagnostic laparoscopy      endometriosis  . Cesarean section  04/02/2012    Procedure: CESAREAN SECTION;  Surgeon: Antionette Char, MD;  Location: [facility];  Service: Obstetrics;  Laterality: N/A;  Repeat cesarean section with delivery of baby girl at 51.   Family History  Problem Relation Age of Onset  . Hypertension Mother    History  Substance Use Topics  . Smoking status: Former Smoker -- 3 years    Types: Cigarettes    Quit date: 09/05/2007  . Smokeless tobacco: Never Used  . Alcohol Use: No     Comment: rare   OB History    Gravida Para Term Preterm AB TAB SAB Ectopic Multiple Living   3 3 1       3      Review of Systems ROS: Statement: All systems negative except as marked or noted in the HPI; Constitutional: Negative for fever and chills. ; ; Eyes: Negative for eye pain, redness and discharge. ; ; ENMT: Negative for ear pain, hoarseness, nasal congestion, sinus  pressure and sore throat. ; ; Cardiovascular: Negative for chest pain, palpitations, diaphoresis, dyspnea and peripheral edema. ; ; Respiratory: Negative for cough, wheezing and stridor. ; ; Gastrointestinal: +abd pain, "belching." Negative for nausea, vomiting, diarrhea, blood in stool, hematemesis, jaundice and rectal bleeding. . ; ; Genitourinary: Negative for dysuria, flank pain and hematuria. ; ; Musculoskeletal: Negative for back pain and neck pain. Negative for swelling and trauma.; ; Skin: Negative for pruritus, rash, abrasions, blisters, bruising and skin lesion.; ; Neuro: Negative for headache, lightheadedness and neck stiffness. Negative for weakness, altered level of consciousness , altered mental status, extremity weakness, paresthesias, involuntary movement, seizure and syncope.       Allergies  Review of patient's allergies indicates no known allergies.  Home Medications   Prior to Admission medications   Medication Sig Start Date End Date Taking? Authorizing Provider  citalopram (CELEXA) 20 MG tablet Take 20 mg by mouth daily.   Yes Historical Provider, MD  acetaminophen (TYLENOL) 500 MG tablet Take 500-1,000 mg by mouth every 6 (six) hours as needed for mild pain or headache.    Historical Provider, MD  amphetamine-dextroamphetamine (ADDERALL XR) 20 MG 24 hr capsule Take 20 mg by mouth daily.  Historical Provider, MD  chlorpheniramine-HYDROcodone (TUSSIONEX PENNKINETIC ER) 10-8 MG/5ML LQCR Take 5 mLs by mouth every 12 (twelve) hours as needed for cough. 10/06/14   John Molpus, MD  esomeprazole (NEXIUM) 40 MG capsule Take 40 mg by mouth every morning.    Historical Provider, MD  hyoscyamine (LEVSIN SL) 0.125 MG SL tablet Place 0.125 mg under the tongue every 4 (four) hours as needed for cramping (abdominal pain).    Historical Provider, MD  LORazepam (ATIVAN) 1 MG tablet Take 1 tablet (1 mg total) by mouth 3 (three) times daily as needed for anxiety. 12/08/14   Kristen N Ward, DO    BP 103/66 mmHg  Pulse 69  Temp(Src) 98 F (36.7 C) (Oral)  Resp 16  Ht 5\' 8"  (1.727 m)  Wt 198 lb (89.812 kg)  BMI 30.11 kg/m2  SpO2 100%  LMP 01/04/2015 Physical Exam  0730: Physical examination:  Nursing notes reviewed; Vital signs and O2 SAT reviewed;  Constitutional: Well developed, Well nourished, Well hydrated, In no acute distress; Head:  Normocephalic, atraumatic; Eyes: EOMI, PERRL, No scleral icterus; ENMT: Mouth and pharynx normal, Mucous membranes moist; Neck: Supple, Full range of motion, No lymphadenopathy; Cardiovascular: Regular rate and rhythm, No murmur, rub, or gallop; Respiratory: Breath sounds clear & equal bilaterally, No rales, rhonchi, wheezes.  Speaking full sentences with ease, Normal respiratory effort/excursion; Chest: Nontender, Movement normal.; Abdomen: Soft, +mid-epigastric and RUQ tenderness to palp. No rebound or guarding. Nondistended, Normal bowel sounds; Genitourinary: No CVA tenderness; Spine:  No midline CS, TS, LS tenderness. No rash.;; Extremities: Pulses normal, No tenderness, No edema, No calf edema or asymmetry.; Neuro: AA&Ox3, Major CN grossly intact.  Speech clear. No gross focal motor or sensory deficits in extremities. Climbs on and off stretcher easily by herself. Gait steady.; Skin: Color normal, Warm, Dry.   ED Course  Procedures     EKG Interpretation   Date/Time:  Monday Jan 12 2015 06:56:14 EDT Ventricular Rate:  65 PR Interval:  154 QRS Duration: 81 QT Interval:  423 QTC Calculation: 440 R Axis:   55 Text Interpretation:  Sinus rhythm When compared with ECG of 12/08/2014 No  significant change was found Confirmed by [facility] Center[facility]CCMANUS  MD, Nicholos JohnsKATHLEEN [id number])  on 01/12/2015 7:38:26 AM      MDM  MDM Reviewed: previous chart, nursing note and vitals Reviewed previous: labs and ECG Interpretation: labs, ECG, x-ray, ultrasound and CT scan     Results for orders placed or performed during the hospital encounter of 01/12/15   Comprehensive metabolic panel  Result Value Ref Range   Sodium 138 135 - 145 mmol/L   Potassium 3.8 3.5 - 5.1 mmol/L   Chloride 103 101 - 111 mmol/L   CO2 28 22 - 32 mmol/L   Glucose, Bld 88 65 - 99 mg/dL   BUN 9 6 - 20 mg/dL   Creatinine, Ser 6.040.84 0.44 - 1.00 mg/dL   Calcium 8.5 (L) 8.9 - 10.3 mg/dL   Total Protein 6.4 (L) 6.5 - 8.1 g/dL   Albumin 3.7 3.5 - 5.0 g/dL   AST 19 15 - 41 U/L   ALT 21 14 - 54 U/L   Alkaline Phosphatase 69 38 - 126 U/L   Total Bilirubin 0.5 0.3 - 1.2 mg/dL   GFR calc non Af Amer >60 >60 mL/min   GFR calc Af Amer >60 >60 mL/min   Anion gap 7 5 - 15  CBC with Differential  Result Value Ref Range   WBC 6.7 4.0 -  10.5 K/uL   RBC 3.84 (L) 3.87 - 5.11 MIL/uL   Hemoglobin 11.7 (L) 12.0 - 15.0 g/dL   HCT 16.1 (L) 09.6 - 04.5 %   MCV 90.4 78.0 - 100.0 fL   MCH 30.5 26.0 - 34.0 pg   MCHC 33.7 30.0 - 36.0 g/dL   RDW 40.9 81.1 - 91.4 %   Platelets 262 150 - 400 K/uL   Neutrophils Relative % 62 43 - 77 %   Neutro Abs 4.1 1.7 - 7.7 K/uL   Lymphocytes Relative 31 12 - 46 %   Lymphs Abs 2.1 0.7 - 4.0 K/uL   Monocytes Relative 5 3 - 12 %   Monocytes Absolute 0.3 0.1 - 1.0 K/uL   Eosinophils Relative 2 0 - 5 %   Eosinophils Absolute 0.1 0.0 - 0.7 K/uL   Basophils Relative 0 0 - 1 %   Basophils Absolute 0.0 0.0 - 0.1 K/uL  Lipase, blood  Result Value Ref Range   Lipase 26 22 - 51 U/L  Pregnancy, urine  Result Value Ref Range   Preg Test, Ur NEGATIVE NEGATIVE  Urinalysis, Routine w reflex microscopic  Result Value Ref Range   Color, Urine YELLOW YELLOW   APPearance CLEAR CLEAR   Specific Gravity, Urine >1.030 (H) 1.005 - 1.030   pH 5.5 5.0 - 8.0   Glucose, UA NEGATIVE NEGATIVE mg/dL   Hgb urine dipstick NEGATIVE NEGATIVE   Bilirubin Urine NEGATIVE NEGATIVE   Ketones, ur NEGATIVE NEGATIVE mg/dL   Protein, ur NEGATIVE NEGATIVE mg/dL   Urobilinogen, UA 0.2 0.0 - 1.0 mg/dL   Nitrite NEGATIVE NEGATIVE   Leukocytes, UA NEGATIVE NEGATIVE   Dg Abd Acute  W/chest 01/12/2015   CLINICAL DATA:  Right chest pain.  EXAM: DG ABDOMEN ACUTE W/ 1V CHEST  COMPARISON:  12/24/2014  FINDINGS: There is no evidence of dilated bowel loops or free intraperitoneal air. 6 mm left renal calculus noted. Heart size and mediastinal contours are within normal limits. Both lungs are clear.  IMPRESSION: Left renal calculi.   Electronically Signed   By: Signa Kell M.D.   On: 01/12/2015 08:39   Ct Renal Stone Study 01/12/2015   CLINICAL DATA:  Right flank pain beginning 01/11/2015.  EXAM: CT ABDOMEN AND PELVIS WITHOUT CONTRAST  TECHNIQUE: Multidetector CT imaging of the abdomen and pelvis was performed following the standard protocol without IV contrast.  COMPARISON:  None.  FINDINGS: The lung bases are clear.  No pleural or pericardial effusion.  There is no hydronephrosis on the right or left and no ureteral stones are identified. The patient has a 0.7 cm nonobstructing stone in the lower pole of the left kidney.  The gallbladder, liver, spleen, adrenal glands, pancreas and biliary tree all appear normal.  Uterus, adnexa and urinary bladder are unremarkable. The stomach, small and large bowel and appendix appear normal. No lymphadenopathy or fluid is seen. No focal bony abnormality is identified.  IMPRESSION: No acute abnormality or finding to explain the patient's right flank pain.  0.7 cm nonobstructing stone lower pole left kidney.   Electronically Signed   By: Drusilla Kanner M.D.   On: 01/12/2015 11:11   US Abdomen Limited Ruq  01/12/2015   CLINICAL DATA:  Right upper quadrant pain for 2 days  EXAM: US ABDOMEN LIMITED - RIGHT UPPER QUADRANT  COMPARISON:  None.  FINDINGS: Gallbladder:  No gallstones or wall thickening visualized. No sonographic Murphy sign noted.  Common bile duct:  Diameter: 1.1 mm in diameter  within normal limits.  Liver:  No focal lesion identified. Within normal limits in parenchymal echogenicity.  IMPRESSION: Normal right upper quadrant ultrasound.    Electronically Signed   By: Natasha Mead M.D.   On: 01/12/2015 09:53    1200:  CT performed after negative [place] after AXR revealed renal calculi on left side. Evaluation reassuring. Doubt PE as cause for symptoms with low risk [place]. Pt requesting another dose of pain meds then wants to go home now. Tx symptomatically at this time, f/u GI MD. Dx and testing d/w pt and family.  Questions answered.  Verb understanding, agreeable to d/c home with outpt f/u.     Samuel Jester, DO 01/15/15 2040

## 2015-02-28 ENCOUNTER — Emergency Department (HOSPITAL_COMMUNITY)
Admission: EM | Admit: 2015-02-28 | Discharge: 2015-02-28 | Disposition: A | Discharge status: Home / Self Care | Payer: Medicaid Other | Attending: Emergency Medicine | Admitting: Emergency Medicine

## 2015-02-28 ENCOUNTER — Encounter (HOSPITAL_COMMUNITY): Payer: Self-pay | Admitting: *Deleted

## 2015-02-28 ENCOUNTER — Encounter (HOSPITAL_COMMUNITY): Payer: Self-pay | Admitting: Emergency Medicine

## 2015-02-28 ENCOUNTER — Emergency Department (INDEPENDENT_AMBULATORY_CARE_PROVIDER_SITE_OTHER)
Admission: EM | Admit: 2015-02-28 | Discharge: 2015-02-28 | Disposition: A | Discharge status: Home / Self Care | Payer: Medicaid Other | Source: Home / Self Care | Attending: Emergency Medicine | Admitting: Emergency Medicine

## 2015-02-28 DIAGNOSIS — R000 Tachycardia, unspecified: Secondary | ICD-10-CM | POA: Diagnosis not present

## 2015-02-28 DIAGNOSIS — Z8719 Personal history of other diseases of the digestive system: Secondary | ICD-10-CM | POA: Diagnosis not present

## 2015-02-28 DIAGNOSIS — Z79899 Other long term (current) drug therapy: Secondary | ICD-10-CM | POA: Diagnosis not present

## 2015-02-28 DIAGNOSIS — Z87891 Personal history of nicotine dependence: Secondary | ICD-10-CM | POA: Insufficient documentation

## 2015-02-28 DIAGNOSIS — Z8742 Personal history of other diseases of the female genital tract: Secondary | ICD-10-CM | POA: Insufficient documentation

## 2015-02-28 DIAGNOSIS — T700XXA Otitic barotrauma, initial encounter: Secondary | ICD-10-CM

## 2015-02-28 DIAGNOSIS — R52 Pain, unspecified: Secondary | ICD-10-CM

## 2015-02-28 DIAGNOSIS — H9203 Otalgia, bilateral: Secondary | ICD-10-CM | POA: Diagnosis not present

## 2015-02-28 DIAGNOSIS — H9209 Otalgia, unspecified ear: Secondary | ICD-10-CM | POA: Diagnosis not present

## 2015-02-28 DIAGNOSIS — M791 Myalgia, unspecified site: Secondary | ICD-10-CM

## 2015-02-28 DIAGNOSIS — R0982 Postnasal drip: Secondary | ICD-10-CM

## 2015-02-28 DIAGNOSIS — H6983 Other specified disorders of Eustachian tube, bilateral: Secondary | ICD-10-CM | POA: Diagnosis not present

## 2015-02-28 DIAGNOSIS — J3489 Other specified disorders of nose and nasal sinuses: Secondary | ICD-10-CM | POA: Insufficient documentation

## 2015-02-28 DIAGNOSIS — R05 Cough: Secondary | ICD-10-CM

## 2015-02-28 DIAGNOSIS — H6993 Unspecified Eustachian tube disorder, bilateral: Secondary | ICD-10-CM

## 2015-02-28 DIAGNOSIS — R059 Cough, unspecified: Secondary | ICD-10-CM

## 2015-02-28 DIAGNOSIS — J029 Acute pharyngitis, unspecified: Secondary | ICD-10-CM | POA: Diagnosis present

## 2015-02-28 LAB — POCT RAPID STREP A: Streptococcus, Group A Screen (Direct): NEGATIVE

## 2015-02-28 MED ORDER — GUAIFENESIN-CODEINE 100-10 MG/5ML PO SYRP: 5.0000 mL | ORAL_SOLUTION | Freq: Three times a day (TID) | ORAL | Status: DC | PRN

## 2015-02-28 MED ORDER — PSEUDOEPHEDRINE HCL 60 MG PO TABS
60.0000 mg | ORAL_TABLET | Freq: Once | ORAL | Status: AC
  Administered 2015-02-28: 60 mg via ORAL
  Filled 2015-02-28: qty 1

## 2015-02-28 MED ORDER — PSEUDOEPHEDRINE HCL 30 MG PO TABS: 30.0000 mg | ORAL_TABLET | Freq: Four times a day (QID) | ORAL | Status: DC | PRN

## 2015-02-28 MED ORDER — HYDROCOD POLST-CPM POLST ER 10-8 MG/5ML PO SUER
5.0000 mL | Freq: Once | ORAL | Status: AC
  Administered 2015-02-28: 5 mL via ORAL
  Filled 2015-02-28: qty 5

## 2015-02-28 NOTE — ED Provider Notes (Signed)
CSN: [id number]     Arrival date & time 02/28/15  1908 History   First MD Initiated Contact with Patient 02/28/15 1919     Chief Complaint  Patient presents with  . Sore Throat     (Consider location/radiation/quality/duration/timing/severity/associated sxs/prior Treatment) The history is provided by the patient.   Ana Pace is a 34 y.o. female who presents to the ED with a sore throat, cough, ear ache, body aches and low grade fever. She reports have gone to Urgent Care and had a negative strep screen and told to use OTC medications. She states that she has been using multiple OTC medication for cough and congestion without relief. She reports nasal congestion and pressure in her ears. Patient states she can not sleep due to the cough.   Past Medical History  Diagnosis Date  . Abnormal Pap smear   . Endometrial hyperplasia without atypia, simple   . GERD (gastroesophageal reflux disease)     tums prn   Past Surgical History  Procedure Laterality Date  . Cesarean section      x3  . Diagnostic laparoscopy      endometriosis  . Cesarean section  04/02/2012    Procedure: CESAREAN SECTION;  Surgeon: Antionette CharLisa Jackson-Moore, MD;  Location: [facility];  Service: Obstetrics;  Laterality: N/A;  Repeat cesarean section with delivery of baby girl at [id number].   Family History  Problem Relation Age of Onset  . Hypertension Mother    History  Substance Use Topics  . Smoking status: Former Smoker -- 3 years    Types: Cigarettes    Quit date: 09/05/2007  . Smokeless tobacco: Never Used  . Alcohol Use: No     Comment: rare   OB History    Gravida Para Term Preterm AB TAB SAB Ectopic Multiple Living   3 3 1       3      Review of Systems  Constitutional: Fever: low grade.  HENT: Positive for ear pain, rhinorrhea and sore throat.   Eyes: Negative for visual disturbance.  Respiratory: Positive for cough.   Gastrointestinal: Negative for nausea, vomiting and abdominal pain.  Genitourinary:  Negative for dysuria and urgency.  Musculoskeletal: Positive for myalgias.  Skin: Negative for rash.  Neurological: Negative for syncope and headaches.  Psychiatric/Behavioral: Negative for confusion. The patient is not nervous/anxious.       Allergies  Review of patient's allergies indicates no known allergies.  Home Medications   Prior to Admission medications   Medication Sig Start Date End Date Taking? Authorizing Provider  citalopram (CELEXA) 20 MG tablet Take 20 mg by mouth daily.    Historical Provider, MD  guaiFENesin-codeine (ROBITUSSIN AC) 100-10 MG/5ML syrup Take 5 mLs by mouth 3 (three) times daily as needed for cough. 02/28/15   Delia Sitar Orlene OchM Bereket Gernert, NP  LORazepam (ATIVAN) 1 MG tablet Take 1 tablet (1 mg total) by mouth 3 (three) times daily as needed for anxiety. Patient not taking: Reported on 01/12/2015 12/08/14   Kristen N Ward, DO  ondansetron (ZOFRAN) 4 MG tablet Take 1 tablet (4 mg total) by mouth every 8 (eight) hours as needed for nausea or vomiting. 01/12/15   Samuel JesterKathleen McManus, DO  pseudoephedrine (SUDAFED) 30 MG tablet Take 1 tablet (30 mg total) by mouth every 6 (six) hours as needed for congestion. 02/28/15   Janayah Zavada Orlene OchM Eban Weick, NP   BP 120/76 mmHg  Pulse 106  Temp(Src) 99.2 F (37.3 C) (Oral)  Resp 22  Ht 5\' 7"  (  1.702 m)  Wt 210 lb (95.255 kg)  BMI 32.88 kg/m2  SpO2 100%  LMP 02/07/2015 Physical Exam  Constitutional: She is oriented to person, place, and time. She appears well-developed and well-nourished. No distress.  HENT:  Head: Normocephalic.  Right Ear: Tympanic membrane normal.  Left Ear: Tympanic membrane is retracted.  Nose: Mucosal edema and rhinorrhea present. Right sinus exhibits maxillary sinus tenderness. Left sinus exhibits maxillary sinus tenderness.  Eyes: EOM are normal.  Neck: Neck supple.  Cardiovascular: Regular rhythm.  Tachycardia present.   Pulmonary/Chest: Effort normal. She has no wheezes. She has no rales.  Musculoskeletal: Normal range  of motion.  Neurological: She is alert and oriented to person, place, and time. No cranial nerve deficit.  Skin: Skin is warm and dry.  Psychiatric: She has a normal mood and affect. Her behavior is normal.  Nursing note and vitals reviewed.   ED Course  Procedures   MDM  34 y.o. female with sore throat, cough body aches and low grade fever x 2 days stable for d/c with negative strep screen at Urgent Care today and does not appear toxic. Will treat with decongestants for nasal and sinus congestion and give Tussionex for cough. She will use a humidifier, chloraseptic spray and take ibuprofen. Discussed with the patient clinical findings and plan of care and all questioned fully answered. She will return if any problems arise.   Final diagnoses:  Pharyngitis  Cough  Body aches      Janne Napoleon, NP 02/28/15 2007  Donnetta Hutching, MD 02/28/15 2149

## 2015-02-28 NOTE — Discharge Instructions (Signed)
Barotitis Media Barotitis media is inflammation of your middle ear. This occurs when the auditory tube (eustachian tube) leading from the back of your nose (nasopharynx) to your eardrum is blocked. This blockage may result from a cold, environmental allergies, or an upper respiratory infection. Unresolved barotitis media may lead to damage or hearing loss (barotrauma), which may become permanent. HOME CARE INSTRUCTIONS   Use medicines as recommended by your health care provider. Over-the-counter medicines will help unblock the canal and can help during times of air travel.  Do not put anything into your ears to clean or unplug them. Eardrops will not be helpful.  Do not swim, dive, or fly until your health care provider says it is all right to do so. If these activities are necessary, chewing gum with frequent, forceful swallowing may help. It is also helpful to hold your nose and gently blow to pop your ears for equalizing pressure changes. This forces air into the eustachian tube.  Only take over-the-counter or prescription medicines for pain, discomfort, or fever as directed by your health care provider.  A decongestant may be helpful in decongesting the middle ear and make pressure equalization easier. SEEK MEDICAL CARE IF:  You experience a serious form of dizziness in which you feel as if the room is spinning and you feel nauseated (vertigo).  Your symptoms only involve one ear. SEEK IMMEDIATE MEDICAL CARE IF:   You develop a severe headache, dizziness, or severe ear pain.  You have bloody or pus-like drainage from your ears.  You develop a fever.  Your problems do not improve or become worse. MAKE SURE YOU:   Understand these instructions.  Will watch your condition.  Will get help right away if you are not doing well or get worse. Document Released: 07/29/2000 Document Revised: 05/22/2013 Document Reviewed: 02/26/2013 [facility] Patient Information 2015 [place], [place]. This  information is not intended to replace advice given to you by your health care provider. Make sure you discuss any questions you have with your health care provider.  Otalgia Otalgia is pain in or around the ear. When the pain is from the ear itself it is called primary otalgia. Pain may also be coming from somewhere else, like the head and neck. This is called secondary otalgia.  CAUSES  Causes of primary otalgia include:  Middle ear infection.  It can also be caused by injury to the ear or infection of the ear canal (swimmer's ear). Swimmer's ear causes pain, swelling and often drainage from the ear canal. Causes of secondary otalgia include:  Sinus infections.  Allergies and colds that cause stuffiness of the nose and tubes that drain the ears (eustachian tubes).  Dental problems like cavities, gum infections or teething.  Sore Throat (tonsillitis and pharyngitis).  Swollen glands in the neck.  Infection of the bone behind the ear (mastoiditis).  TMJ discomfort (problems with the joint between your jaw and your skull).  Other problems such as nerve disorders, circulation problems, heart disease and tumors of the head and neck can also cause symptoms of ear pain. This is rare. DIAGNOSIS  Evaluation, Diagnosis and Testing:  Examination by your medical caregiver is recommended to evaluate and diagnose the cause of otalgia.  Further testing or referral to a specialist may be indicated if the cause of the ear pain is not found and the symptom persists. TREATMENT   Your doctor may prescribe antibiotics if an ear infection is diagnosed.  Pain relievers and topical analgesics may be recommended.  It is important to take all medications as prescribed. HOME CARE INSTRUCTIONS   It may be helpful to sleep with the painful ear in the up position.  A warm compress over the painful ear may provide relief.  A soft diet and avoiding gum may help while ear pain is present. SEEK  IMMEDIATE MEDICAL CARE IF:  You develop severe pain, a high fever, repeated vomiting or dehydration.  You develop extreme dizziness, headache, confusion, ringing in the ears (tinnitus) or hearing loss. Document Released: 09/08/2004 Document Revised: 10/24/2011 Document Reviewed: 06/10/2009 [facility] Patient Information 2015 [place], [place]. This information is not intended to replace advice given to you by your health care provider. Make sure you discuss any questions you have with your health care provider.  Viral Infections A virus is a type of germ. Viruses can cause:  Minor sore throats.  Aches and pains.  Headaches.  Runny nose.  Rashes.  Watery eyes.  Tiredness.  Coughs.  Loss of appetite.  Feeling sick to your stomach (nausea).  Throwing up (vomiting).  Watery poop (diarrhea). HOME CARE   Only take medicines as told by your doctor.  Drink enough water and fluids to keep your pee (urine) clear or pale yellow. Sports drinks are a good choice.  Get plenty of rest and eat healthy. Soups and broths with crackers or rice are fine. GET HELP RIGHT AWAY IF:   You have a very bad headache.  You have shortness of breath.  You have chest pain or neck pain.  You have an unusual rash.  You cannot stop throwing up.  You have watery poop that does not stop.  You cannot keep fluids down.  You or your child has a temperature by mouth above 102 F (38.9 C), not controlled by medicine.  Your baby is older than 3 months with a rectal temperature of 102 F (38.9 C) or higher.  Your baby is 82 months old or younger with a rectal temperature of 100.4 F (38 C) or higher. MAKE SURE YOU:   Understand these instructions.  Will watch this condition.  Will get help right away if you are not doing well or get worse. Document Released: 07/14/2008 Document Revised: 10/24/2011 Document Reviewed: 12/07/2010 [facility] Patient Information 2015 [place], [place]. This  information is not intended to replace advice given to you by your health care provider. Make sure you discuss any questions you have with your health care provider.

## 2015-02-28 NOTE — ED Notes (Signed)
Sore throat, ear pain, cough that started last night

## 2015-02-28 NOTE — ED Provider Notes (Signed)
CSN: [id number]     Arrival date & time 02/28/15  1658 History   None    Chief Complaint  Patient presents with  . Sore Throat  . Otalgia   (Consider location/radiation/quality/duration/timing/severity/associated sxs/prior Treatment) HPI Comments: 34 year old female states that last night she developed sore throat, body aches, dry cough, chest and back pain and bilateral earaches. Denies having fever or shortness of breath.   Past Medical History  Diagnosis Date  . Abnormal Pap smear   . Endometrial hyperplasia without atypia, simple   . GERD (gastroesophageal reflux disease)     tums prn   Past Surgical History  Procedure Laterality Date  . Cesarean section      x3  . Diagnostic laparoscopy      endometriosis  . Cesarean section  04/02/2012    Procedure: CESAREAN SECTION;  Surgeon: Antionette Char, MD;  Location: [facility];  Service: Obstetrics;  Laterality: N/A;  Repeat cesarean section with delivery of baby girl at 67.   Family History  Problem Relation Age of Onset  . Hypertension Mother    History  Substance Use Topics  . Smoking status: Former Smoker -- 3 years    Types: Cigarettes    Quit date: 09/05/2007  . Smokeless tobacco: Never Used  . Alcohol Use: No     Comment: rare   OB History    Gravida Para Term Preterm AB TAB SAB Ectopic Multiple Living   Review of Systems  Constitutional: Positive for activity change. Negative for fever.  HENT: Positive for ear pain and sore throat. Negative for congestion, facial swelling, hearing loss, postnasal drip, rhinorrhea, sinus pressure and trouble swallowing.   Eyes: Negative.   Respiratory: Negative for cough and shortness of breath.   Cardiovascular: Positive for chest pain.  Musculoskeletal: Positive for back pain.  Skin: Negative.   Neurological: Negative.   All other systems reviewed and are negative.   Allergies  Review of patient's allergies indicates no known allergies.  Home  Medications   Prior to Admission medications   Medication Sig Start Date End Date Taking? Authorizing Provider  chlorpheniramine-HYDROcodone (TUSSIONEX PENNKINETIC ER) 10-8 MG/5ML LQCR Take 5 mLs by mouth every 12 (twelve) hours as needed for cough. Patient not taking: Reported on 01/12/2015 10/06/14   Paula Libra, MD  citalopram (CELEXA) 20 MG tablet Take 20 mg by mouth daily.    Historical Provider, MD  LORazepam (ATIVAN) 1 MG tablet Take 1 tablet (1 mg total) by mouth 3 (three) times daily as needed for anxiety. Patient not taking: Reported on 01/12/2015 12/08/14   Kristen N Ward, DO  ondansetron (ZOFRAN) 4 MG tablet Take 1 tablet (4 mg total) by mouth every 8 (eight) hours as needed for nausea or vomiting. 01/12/15   Samuel Jester, DO  oxyCODONE-acetaminophen (PERCOCET/ROXICET) 5-325 MG per tablet 1 or 2 tabs PO q6h prn pain 01/12/15   Samuel Jester, DO   BP 111/77 mmHg  Pulse 99  Temp(Src) 98.5 F (36.9 C) (Oral)  Resp 18  SpO2 100% Physical Exam  Constitutional: She is oriented to person, place, and time. She appears well-developed and well-nourished. No distress.  HENT:  Head: Normocephalic and atraumatic.  Left Ear: External ear normal.  Mouth/Throat: No oropharyngeal exudate.  Right TM retracted. Minimal injection over the umbo. No effusion. Oropharynx is clear and moist. There is scant thick PND. Tenderness over the bilat posterior angle of the jaw along  the ET tract.  Eyes: Conjunctivae and EOM are normal.  Neck: Normal range of motion. Neck supple.  Cardiovascular: Normal rate, regular rhythm and normal heart sounds.   Pulmonary/Chest: Effort normal and breath sounds normal. No respiratory distress. She has no wheezes. She has no rales.  Musculoskeletal: She exhibits no edema.  Lymphadenopathy:    She has no cervical adenopathy.  Neurological: She is alert and oriented to person, place, and time. She exhibits normal muscle tone.  Skin: Skin is warm and dry.   Psychiatric: She has a normal mood and affect.  Nursing note and vitals reviewed.   ED Course  Procedures (including critical care time) Labs Review Labs Reviewed  POCT RAPID STREP A    Imaging Review No results found. Results for orders placed or performed during the hospital encounter of 02/28/15  POCT rapid strep A [facility])  Result Value Ref Range   Streptococcus, Group A Screen (Direct) NEGATIVE NEGATIVE     MDM   1. Otalgia of both ears   2. ETD (eustachian tube dysfunction), bilateral   3. Barotitis media, initial encounter   4. PND (post-nasal drip)   5. Myalgia    Rest, fluids Tylenol or ibuprofen as needed For new symptoms or problems may return or go to hte ED    Hayden Rasmussenavid Dailyn Reith, NP 02/28/15 1826

## 2015-02-28 NOTE — ED Notes (Signed)
Pt reporting sore throat and cough for past couple days.  Reporting pain in ears and under eyes.  States that she just left Urgent Care and strep screen was negative. Denies any prescriptions from Urgent Care.

## 2015-02-28 NOTE — Discharge Instructions (Signed)
Take ibuprofen for body aches. Do not take the cough medication if driving as it will make you sleepy. Return as needed for worsening symptoms.

## 2015-03-02 LAB — CULTURE, GROUP A STREP: Strep A Culture: NEGATIVE

## 2015-03-03 NOTE — ED Notes (Signed)
Final report of strep test negative. No further action required

## 2015-04-02 ENCOUNTER — Encounter: Payer: Self-pay | Admitting: Certified Nurse Midwife

## 2015-04-02 ENCOUNTER — Ambulatory Visit: Payer: Self-pay | Admitting: Certified Nurse Midwife

## 2015-04-02 ENCOUNTER — Ambulatory Visit (INDEPENDENT_AMBULATORY_CARE_PROVIDER_SITE_OTHER): Payer: Medicaid Other | Admitting: Certified Nurse Midwife

## 2015-04-02 VITALS — BP 119/78 | HR 87 | Temp 98.5°F | Ht 68.0 in | Wt 217.2 lb

## 2015-04-02 DIAGNOSIS — Z0000 Encounter for general adult medical examination without abnormal findings: Secondary | ICD-10-CM | POA: Diagnosis not present

## 2015-04-02 DIAGNOSIS — N809 Endometriosis, unspecified: Secondary | ICD-10-CM

## 2015-04-02 DIAGNOSIS — Z3009 Encounter for other general counseling and advice on contraception: Secondary | ICD-10-CM

## 2015-04-02 LAB — POCT URINE PREGNANCY: PREG TEST UR: NEGATIVE

## 2015-04-02 MED ORDER — ETONOGESTREL-ETHINYL ESTRADIOL 0.12-0.015 MG/24HR VA RING: 1.0000 | VAGINAL_RING | VAGINAL | Status: DC

## 2015-04-02 NOTE — Progress Notes (Signed)
Patient ID: Ana Pace, female   DOB: 11/04/1980, 34 y.o.   MRN: [id number]    Subjective:        Ana Pace is a 34 y.o. female here for a routine exam.  Current complaints: hx of endometriosis, was in [place] when she was dx.  Menses are monthly, lasting about 6-7 days, with dime sized clots, menorrhagia, dysmenorrhea, back pain, denies diarrhea/constipation, N&V.  Has had problems since roughly 2007.  In [employer].  Did 1 deployment.  Is currently in nursing school.  Currently starting a relationship.  Not sure about having more children.  Did 2 Lupron rounds.  Declines blood testing today.      Has tried Depo in the past was amenorrheic but gained weight.    Personal health questionnaire:  Is patient Ana Pace, have a family history of breast and/or ovarian cancer: yes, MGA ovarian cancer, MGM BCA dx around their 40's.   Is there a family history of uterine cancer diagnosed at age < 22, gastrointestinal cancer, urinary tract cancer, family member who is a Personnel officer syndrome-associated carrier: no Is the patient overweight and hypertensive, family history of diabetes, personal history of gestational diabetes, preeclampsia or PCOS: no Is patient over 59, have PCOS,  family history of premature CHD under age 35, diabetes, smoke, have hypertension or peripheral artery disease:  Yes, HTN At any time, has a partner hit, kicked or otherwise hurt or frightened you?: no Over the past 2 weeks, have you felt down, depressed or hopeless?: no Over the past 2 weeks, have you felt little interest or pleasure in doing things?:no   Gynecologic History Patient's last menstrual period was 03/27/2015. Contraception: abstinence Last Pap: unknown. Results were: abnormal according to the patient, had a leep in 2004. Last mammogram: N/A.   Obstetric History OB History  Gravida Para Term Preterm AB SAB TAB Ectopic Multiple Living  3 3 1       3     # Outcome Date GA Lbr Len/2nd Weight Sex  Delivery Anes PTL Lv  3 Term 04/02/12 [redacted]w[redacted]d   F CS-LTranv Spinal  Y  2 Para      CS-LTranv     1 Para      CS-LTranv         Past Medical History  Diagnosis Date  . Abnormal Pap smear   . Endometrial hyperplasia without atypia, simple   . GERD (gastroesophageal reflux disease)     tums prn    Past Surgical History  Procedure Laterality Date  . Cesarean section      x3  . Diagnostic laparoscopy      endometriosis  . Cesarean section  04/02/2012    Procedure: CESAREAN SECTION;  Surgeon: Antionette Char, MD;  Location: [facility];  Service: Obstetrics;  Laterality: N/A;  Repeat cesarean section with delivery of baby girl at 51.     Current outpatient prescriptions:  .  citalopram (CELEXA) 20 MG tablet, Take 20 mg by mouth daily., Disp: , Rfl:  .  etonogestrel-ethinyl estradiol (NUVARING) 0.12-0.015 MG/24HR vaginal ring, Place 1 each vaginally every 21 ( twenty-one) days. Insert one (1) ring vaginally and leave in place for three (3) weeks, then change., Disp: 1 each, Rfl: 4 .  guaiFENesin-codeine (ROBITUSSIN AC) 100-10 MG/5ML syrup, Take 5 mLs by mouth 3 (three) times daily as needed for cough. (Patient not taking: Reported on 04/02/2015), Disp: 120 mL, Rfl: 0 .  LORazepam (ATIVAN) 1 MG tablet, Take 1 tablet (1 mg  total) by mouth 3 (three) times daily as needed for anxiety. (Patient not taking: Reported on 01/12/2015), Disp: 20 tablet, Rfl: 0 .  ondansetron (ZOFRAN) 4 MG tablet, Take 1 tablet (4 mg total) by mouth every 8 (eight) hours as needed for nausea or vomiting. (Patient not taking: Reported on 04/02/2015), Disp: 6 tablet, Rfl: 0 .  pseudoephedrine (SUDAFED) 30 MG tablet, Take 1 tablet (30 mg total) by mouth every 6 (six) hours as needed for congestion. (Patient not taking: Reported on 04/02/2015), Disp: 30 tablet, Rfl: 0 .  [DISCONTINUED] albuterol (PROVENTIL HFA;VENTOLIN HFA) 108 (90 BASE) MCG/ACT inhaler, Inhale 2 puffs into the lungs every 4 (four) hours as needed for wheezing or  shortness of breath., Disp: 1 Inhaler, Rfl: 0 .  [DISCONTINUED] famotidine (PEPCID) 20 MG tablet, Take 1 tablet (20 mg total) by mouth 2 (two) times daily., Disp: 30 tablet, Rfl: 0 .  [DISCONTINUED] ipratropium (ATROVENT) 0.06 % nasal spray, Place 2 sprays into both nostrils 4 (four) times daily., Disp: 15 mL, Rfl: 1 .  [DISCONTINUED] pantoprazole (PROTONIX) 20 MG tablet, Take 1 tablet (20 mg total) by mouth 2 (two) times daily., Disp: 30 tablet, Rfl: 0 No Known Allergies  Social History  Substance Use Topics  . Smoking status: Former Smoker -- 3 years    Types: Cigarettes    Quit date: 09/05/2007  . Smokeless tobacco: Never Used  . Alcohol Use: No     Comment: rare    Family History  Problem Relation Age of Onset  . Hypertension Mother       Review of Systems  Constitutional: negative for fatigue and weight loss Respiratory: negative for cough and wheezing Cardiovascular: negative for chest pain, fatigue and palpitations Gastrointestinal: negative for abdominal pain and change in bowel habits Musculoskeletal:negative for myalgias Neurological: negative for gait problems and tremors Behavioral/Psych: negative for abusive relationship, depression Endocrine: negative for temperature intolerance   Genitourinary:negative for genital lesions, hot flashes, sexual problems and vaginal discharge.  +abnormal menstrual periods.    Integument/breast: negative for breast lump, breast tenderness, nipple discharge and skin lesion(s)    Objective:       BP 119/78 mmHg  Pulse 87  Temp(Src) 98.5 F (36.9 C)  Ht 5\' 8"  (1.727 m)  Wt 217 lb 3.2 oz (98.521 kg)  BMI 33.03 kg/m2  LMP 03/27/2015 General:   alert  Skin:   no rash or abnormalities  Lungs:   clear to auscultation bilaterally  Heart:   regular rate and rhythm, S1, S2 normal, no murmur, click, rub or gallop  Breasts:   normal without suspicious masses, skin or nipple changes or axillary nodes  Abdomen:  normal findings: no  organomegaly, soft, non-tender and no hernia  Pelvis:  External genitalia: normal general appearance Urinary system: urethral meatus normal and bladder without fullness, nontender Vaginal: normal without tenderness, induration or masses Cervix: normal appearance Adnexa: normal bimanual exam Uterus: anteverted and non-tender, normal size   Lab Review Urine pregnancy test Labs reviewed yes Radiologic studies reviewed no  50% of 30 min visit spent on counseling and coordination of care.   Assessment:    Healthy female exam.   Laparoscopic confirmed endometriosis Contraception counseling    Plan:    Education reviewed: calcium supplements, depression evaluation, low fat, low cholesterol diet, safe sex/STD prevention, self breast exams, skin cancer screening and weight bearing exercise. Contraception: NuvaRing vaginal inserts. Follow up in: 2 months.   Meds ordered this encounter  Medications  . etonogestrel-ethinyl estradiol (NUVARING)  0.12-0.015 MG/24HR vaginal ring    Sig: Place 1 each vaginally every 21 ( twenty-one) days. Insert one (1) ring vaginally and leave in place for three (3) weeks, then change.    Dispense:  1 each    Refill:  4   Orders Placed This Encounter  Procedures  . SureSwab, Vaginosis/Vaginitis Plus  . POCT urine pregnancy

## 2015-04-06 LAB — PAP, TP IMAGING W/ HPV RNA, RFLX HPV TYPE 16,18/45: HPV mRNA, High Risk: NOT DETECTED

## 2015-04-07 ENCOUNTER — Other Ambulatory Visit: Payer: Self-pay | Admitting: Certified Nurse Midwife

## 2015-04-07 DIAGNOSIS — N760 Acute vaginitis: Secondary | ICD-10-CM

## 2015-04-07 DIAGNOSIS — B9689 Other specified bacterial agents as the cause of diseases classified elsewhere: Secondary | ICD-10-CM

## 2015-04-07 LAB — SURESWAB, VAGINOSIS/VAGINITIS PLUS
Atopobium vaginae: NOT DETECTED Log (cells/mL)
C. PARAPSILOSIS, DNA: NOT DETECTED
C. TRACHOMATIS RNA, TMA: NOT DETECTED
C. TROPICALIS, DNA: NOT DETECTED
C. albicans, DNA: NOT DETECTED
C. glabrata, DNA: NOT DETECTED
GARDNERELLA VAGINALIS: 6 Log (cells/mL)
LACTOBACILLUS SPECIES: NOT DETECTED Log (cells/mL)
MEGASPHAERA SPECIES: NOT DETECTED Log (cells/mL)
N. gonorrhoeae RNA, TMA: NOT DETECTED
T. VAGINALIS RNA, QL TMA: NOT DETECTED

## 2015-04-07 MED ORDER — METRONIDAZOLE 500 MG PO TABS: 500.0000 mg | ORAL_TABLET | Freq: Two times a day (BID) | ORAL | Status: DC

## 2015-04-10 ENCOUNTER — Encounter: Payer: Self-pay | Admitting: *Deleted

## 2015-04-24 ENCOUNTER — Emergency Department (HOSPITAL_COMMUNITY)
Admission: EM | Admit: 2015-04-24 | Discharge: 2015-04-24 | Disposition: A | Discharge status: Home / Self Care | Payer: Medicaid Other | Attending: Emergency Medicine | Admitting: Emergency Medicine

## 2015-04-24 ENCOUNTER — Encounter (HOSPITAL_COMMUNITY): Payer: Self-pay | Admitting: *Deleted

## 2015-04-24 DIAGNOSIS — Z8719 Personal history of other diseases of the digestive system: Secondary | ICD-10-CM | POA: Insufficient documentation

## 2015-04-24 DIAGNOSIS — Z79899 Other long term (current) drug therapy: Secondary | ICD-10-CM | POA: Diagnosis not present

## 2015-04-24 DIAGNOSIS — Z87891 Personal history of nicotine dependence: Secondary | ICD-10-CM | POA: Diagnosis not present

## 2015-04-24 DIAGNOSIS — M5431 Sciatica, right side: Secondary | ICD-10-CM

## 2015-04-24 DIAGNOSIS — Z8742 Personal history of other diseases of the female genital tract: Secondary | ICD-10-CM | POA: Diagnosis not present

## 2015-04-24 DIAGNOSIS — M545 Low back pain: Secondary | ICD-10-CM | POA: Diagnosis present

## 2015-04-24 MED ORDER — METHOCARBAMOL 500 MG PO TABS: 500.0000 mg | ORAL_TABLET | Freq: Three times a day (TID) | ORAL | Status: DC

## 2015-04-24 MED ORDER — KETOROLAC TROMETHAMINE 60 MG/2ML IM SOLN
60.0000 mg | Freq: Once | INTRAMUSCULAR | Status: AC
  Administered 2015-04-24: 60 mg via INTRAMUSCULAR

## 2015-04-24 MED ORDER — DEXAMETHASONE 4 MG PO TABS: 4.0000 mg | ORAL_TABLET | Freq: Two times a day (BID) | ORAL | Status: DC

## 2015-04-24 MED ORDER — DIAZEPAM 5 MG PO TABS
5.0000 mg | ORAL_TABLET | Freq: Once | ORAL | Status: AC
  Administered 2015-04-24: 5 mg via ORAL
  Filled 2015-04-24: qty 1

## 2015-04-24 MED ORDER — HYDROCODONE-ACETAMINOPHEN 5-325 MG PO TABS
2.0000 | ORAL_TABLET | Freq: Once | ORAL | Status: AC
  Administered 2015-04-24: 2 via ORAL
  Filled 2015-04-24: qty 2

## 2015-04-24 MED ORDER — KETOROLAC TROMETHAMINE 60 MG/2ML IM SOLN
INTRAMUSCULAR | Status: AC
  Filled 2015-04-24: qty 2

## 2015-04-24 MED ORDER — PREDNISONE 50 MG PO TABS
60.0000 mg | ORAL_TABLET | Freq: Once | ORAL | Status: AC
  Administered 2015-04-24: 60 mg via ORAL
  Filled 2015-04-24 (×2): qty 1

## 2015-04-24 MED ORDER — MELOXICAM 15 MG PO TABS: 15.0000 mg | ORAL_TABLET | Freq: Every day | ORAL | Status: DC

## 2015-04-24 MED ORDER — HYDROCODONE-ACETAMINOPHEN 5-325 MG PO TABS: 1.0000 | ORAL_TABLET | ORAL | Status: DC | PRN

## 2015-04-24 NOTE — ED Provider Notes (Signed)
CSN: [id number]     Arrival date & time 04/24/15  1955 History   First MD Initiated Contact with Patient 04/24/15 2126     Chief Complaint  Patient presents with  . Back Pain     (Consider location/radiation/quality/duration/timing/severity/associated sxs/prior Treatment) Patient is a 34 y.o. female presenting with back pain. The history is provided by the patient.  Back Pain Location:  Lumbar spine Quality:  Shooting (sharp) Radiates to:  R thigh Pain severity:  Severe Pain is:  Same all the time Onset quality:  Gradual Duration:  1 day Timing:  Intermittent Progression:  Worsening Chronicity:  Chronic Context comment:  Hx of sciatica. Relieved by:  Nothing Worsened by:  Movement and standing Associated symptoms: no bladder incontinence and no bowel incontinence     Past Medical History  Diagnosis Date  . Abnormal Pap smear   . Endometrial hyperplasia without atypia, simple   . GERD (gastroesophageal reflux disease)     tums prn   Past Surgical History  Procedure Laterality Date  . Cesarean section      x3  . Diagnostic laparoscopy      endometriosis  . Cesarean section  04/02/2012    Procedure: CESAREAN SECTION;  Surgeon: Antionette Char, MD;  Location: [facility];  Service: Obstetrics;  Laterality: N/A;  Repeat cesarean section with delivery of baby girl at 73.   Family History  Problem Relation Age of Onset  . Hypertension Mother    Social History  Substance Use Topics  . Smoking status: Former Smoker -- 3 years    Types: Cigarettes    Quit date: 09/05/2007  . Smokeless tobacco: Never Used  . Alcohol Use: No     Comment: rare   OB History    Gravida Para Term Preterm AB TAB SAB Ectopic Multiple Living   3 3 1       3      Review of Systems  Gastrointestinal: Negative for bowel incontinence.  Genitourinary: Negative for bladder incontinence.  Musculoskeletal: Positive for back pain.  All other systems reviewed and are negative.     Allergies   Review of patient's allergies indicates no known allergies.  Home Medications   Prior to Admission medications   Medication Sig Start Date End Date Taking? Authorizing Provider  citalopram (CELEXA) 20 MG tablet Take 20 mg by mouth daily.    Historical Provider, MD  etonogestrel-ethinyl estradiol (NUVARING) 0.12-0.015 MG/24HR vaginal ring Place 1 each vaginally every 21 ( twenty-one) days. Insert one (1) ring vaginally and leave in place for three (3) weeks, then change. 04/02/15 03/24/16  Rachelle A Denney, CNM  guaiFENesin-codeine (ROBITUSSIN AC) 100-10 MG/5ML syrup Take 5 mLs by mouth 3 (three) times daily as needed for cough. Patient not taking: Reported on 04/02/2015 02/28/15   Janne Napoleon, NP  LORazepam (ATIVAN) 1 MG tablet Take 1 tablet (1 mg total) by mouth 3 (three) times daily as needed for anxiety. Patient not taking: Reported on 01/12/2015 12/08/14   Kristen N Ward, DO  metroNIDAZOLE (FLAGYL) 500 MG tablet Take 1 tablet (500 mg total) by mouth 2 (two) times daily. 04/07/15   Rachelle A Denney, CNM  ondansetron (ZOFRAN) 4 MG tablet Take 1 tablet (4 mg total) by mouth every 8 (eight) hours as needed for nausea or vomiting. Patient not taking: Reported on 04/02/2015 01/12/15   Samuel Jester, DO  pseudoephedrine (SUDAFED) 30 MG tablet Take 1 tablet (30 mg total) by mouth every 6 (six) hours as needed for congestion.  Patient not taking: Reported on 04/02/2015 02/28/15   Janne Napoleon, NP   BP 121/66 mmHg  Pulse 81  Temp(Src) 98.9 F (37.2 C) (Oral)  Resp 18  Ht  (1.727 m)  Wt 205 lb (92.987 kg)  BMI 31.18 kg/m2  SpO2 100%  LMP 03/27/2015 Physical Exam  Constitutional: She is oriented to person, place, and time. She appears well-developed and well-nourished.  Non-toxic appearance.  HENT:  Head: Normocephalic.  Right Ear: Tympanic membrane and external ear normal.  Left Ear: Tympanic membrane and external ear normal.  Eyes: EOM and lids are normal. Pupils are equal, round, and  reactive to light.  Neck: Normal range of motion. Neck supple. Carotid bruit is not present.  Cardiovascular: Normal rate, regular rhythm, normal heart sounds, intact distal pulses and normal pulses.   Pulmonary/Chest: Breath sounds normal. No respiratory distress.  Abdominal: Soft. Bowel sounds are normal. There is no tenderness. There is no guarding.  Musculoskeletal:       Lumbar back: She exhibits decreased range of motion, tenderness and pain. She exhibits no deformity.       Back:  Pain with change of position and attempted straight leg raise.  Lymphadenopathy:       Head (right side): No submandibular adenopathy present.       Head (left side): No submandibular adenopathy present.    She has no cervical adenopathy.  Neurological: She is alert and oriented to person, place, and time. She has normal strength. No cranial nerve deficit or sensory deficit.  No gross motor or sensory deficits appreciated. Gait is slow but steady. No foot drop noted.  Skin: Skin is warm and dry.  Psychiatric: She has a normal mood and affect. Her speech is normal.  Nursing note and vitals reviewed.   ED Course  Procedures (including critical care time) Labs Review Labs Reviewed - No data to display  Imaging Review No results found. I have personally reviewed and evaluated these images and lab results as part of my medical decision-making.   EKG Interpretation None      MDM  I have reviewed the MR from 2008. Patient was noted to have degenerative changes at 2 different sites at that time. She has not had repeat x-rays. She has participated in physical therapy on a couple of occasions during that 10 year period. no gross neurologic deficits appreciated. Patient will be treated with Decadron, mobic, Norco and Robaxin. Patient referred to Dr. Ophelia Charter for orthopedic evaluation.    Final diagnoses:  None    *I have reviewed nursing notes, vital signs, and all appropriate lab and imaging results for  this patient.**    Ivery Quale, PA-C 04/24/15 2150  Ivery Quale, PA-C 04/26/15 1610  Vanetta Mulders, MD 04/29/15 1651

## 2015-04-24 NOTE — Discharge Instructions (Signed)
Please alternate heat and ice.. Please use Mobic, Decadron, and Robaxin daily. Use Norco for more severe pain. Norco and Robaxin may cause drowsiness, please use these medicines with caution. Please take a can Decadron with food. Please see Dr. Ophelia Charter, or the orthopedic specialist of your choice as sone as possible concerning your sciatica. Sciatica Sciatica is pain, weakness, numbness, or tingling along your sciatic nerve. The nerve starts in the lower back and runs down the back of each leg. Nerve damage or certain conditions pinch or put pressure on the sciatic nerve. This causes the pain, weakness, and other discomforts of sciatica. HOME CARE   Only take medicine as told by your doctor.  Apply ice to the affected area for 20 minutes. Do this 3-4 times a day for the first 48-72 hours. Then try heat in the same way.  Exercise, stretch, or do your usual activities if these do not make your pain worse.  Go to physical therapy as told by your doctor.  Keep all doctor visits as told.  Do not wear high heels or shoes that are not supportive.  Get a firm mattress if your mattress is too soft to lessen pain and discomfort. GET HELP RIGHT AWAY IF:   You cannot control when you poop (bowel movement) or pee (urinate).  You have more weakness in your lower back, lower belly (pelvis), butt (buttocks), or legs.  You have redness or puffiness (swelling) of your back.  You have a burning feeling when you pee.  You have pain that gets worse when you lie down.  You have pain that wakes you from your sleep.  Your pain is worse than past pain.  Your pain lasts longer than 4 weeks.  You are suddenly losing weight without reason. MAKE SURE YOU:   Understand these instructions.  Will watch this condition.  Will get help right away if you are not doing well or get worse. Document Released: 05/10/2008 Document Revised: 01/31/2012 Document Reviewed: 12/11/2011 [facility] Patient Information 2015  [place], [place]. This information is not intended to replace advice given to you by your health care provider. Make sure you discuss any questions you have with your health care provider.

## 2015-04-24 NOTE — ED Notes (Signed)
Pt c/o right sided back pain that radiates down to her right knee. Pt has a hx of sciatica.

## 2015-05-11 ENCOUNTER — Encounter (HOSPITAL_COMMUNITY): Payer: Self-pay | Admitting: Emergency Medicine

## 2015-05-11 ENCOUNTER — Emergency Department (HOSPITAL_COMMUNITY)
Admission: EM | Admit: 2015-05-11 | Discharge: 2015-05-11 | Disposition: A | Discharge status: Home / Self Care | Payer: Medicaid Other | Attending: Emergency Medicine | Admitting: Emergency Medicine

## 2015-05-11 DIAGNOSIS — M5431 Sciatica, right side: Secondary | ICD-10-CM

## 2015-05-11 DIAGNOSIS — Z791 Long term (current) use of non-steroidal anti-inflammatories (NSAID): Secondary | ICD-10-CM | POA: Diagnosis not present

## 2015-05-11 DIAGNOSIS — Z8719 Personal history of other diseases of the digestive system: Secondary | ICD-10-CM | POA: Diagnosis not present

## 2015-05-11 DIAGNOSIS — Z792 Long term (current) use of antibiotics: Secondary | ICD-10-CM | POA: Insufficient documentation

## 2015-05-11 DIAGNOSIS — R202 Paresthesia of skin: Secondary | ICD-10-CM | POA: Diagnosis not present

## 2015-05-11 DIAGNOSIS — R200 Anesthesia of skin: Secondary | ICD-10-CM | POA: Diagnosis not present

## 2015-05-11 DIAGNOSIS — Z79899 Other long term (current) drug therapy: Secondary | ICD-10-CM | POA: Diagnosis not present

## 2015-05-11 DIAGNOSIS — Z8742 Personal history of other diseases of the female genital tract: Secondary | ICD-10-CM | POA: Insufficient documentation

## 2015-05-11 DIAGNOSIS — M62830 Muscle spasm of back: Secondary | ICD-10-CM | POA: Diagnosis not present

## 2015-05-11 DIAGNOSIS — Z87891 Personal history of nicotine dependence: Secondary | ICD-10-CM | POA: Diagnosis not present

## 2015-05-11 DIAGNOSIS — M545 Low back pain: Secondary | ICD-10-CM | POA: Diagnosis present

## 2015-05-11 MED ORDER — KETOROLAC TROMETHAMINE 60 MG/2ML IM SOLN
60.0000 mg | Freq: Once | INTRAMUSCULAR | Status: AC
  Administered 2015-05-11: 60 mg via INTRAMUSCULAR
  Filled 2015-05-11: qty 2

## 2015-05-11 MED ORDER — METHYLPREDNISOLONE 4 MG PO TBPK: ORAL_TABLET | ORAL | Status: DC

## 2015-05-11 MED ORDER — IBUPROFEN 800 MG PO TABS: 800.0000 mg | ORAL_TABLET | Freq: Three times a day (TID) | ORAL | Status: DC

## 2015-05-11 MED ORDER — DIAZEPAM 5 MG PO TABS
5.0000 mg | ORAL_TABLET | Freq: Once | ORAL | Status: AC
  Administered 2015-05-11: 5 mg via ORAL
  Filled 2015-05-11: qty 1

## 2015-05-11 MED ORDER — DIAZEPAM 5 MG PO TABS
5.0000 mg | ORAL_TABLET | Freq: Three times a day (TID) | ORAL | Status: DC | PRN
Start: 2015-05-11 — End: 2015-09-02

## 2015-05-11 MED ORDER — HYDROMORPHONE HCL 1 MG/ML IJ SOLN
1.0000 mg | Freq: Once | INTRAMUSCULAR | Status: AC
Start: 2015-05-11 — End: 2015-05-11
  Administered 2015-05-11: 1 mg via INTRAMUSCULAR
  Filled 2015-05-11: qty 1

## 2015-05-11 NOTE — ED Notes (Signed)
Pt made aware to return if symptoms worsen or if any life threatening symptoms occur.   

## 2015-05-11 NOTE — Discharge Instructions (Signed)
Sciatica Follow-up with her doctor. Do not drive if you're taking the pain medication or muscle relaxers. Return to the ED if you develop new or worsening symptoms. Sciatica is pain, weakness, numbness, or tingling along the path of the sciatic nerve. The nerve starts in the lower back and runs down the back of each leg. The nerve controls the muscles in the lower leg and in the back of the knee, while also providing sensation to the back of the thigh, lower leg, and the sole of your foot. Sciatica is a symptom of another medical condition. For instance, nerve damage or certain conditions, such as a herniated disk or bone spur on the spine, pinch or put pressure on the sciatic nerve. This causes the pain, weakness, or other sensations normally associated with sciatica. Generally, sciatica only affects one side of the body. CAUSES   Herniated or slipped disc.  Degenerative disk disease.  A pain disorder involving the narrow muscle in the buttocks (piriformis syndrome).  Pelvic injury or fracture.  Pregnancy.  Tumor (rare). SYMPTOMS  Symptoms can vary from mild to very severe. The symptoms usually travel from the low back to the buttocks and down the back of the leg. Symptoms can include:  Mild tingling or dull aches in the lower back, leg, or hip.  Numbness in the back of the calf or sole of the foot.  Burning sensations in the lower back, leg, or hip.  Sharp pains in the lower back, leg, or hip.  Leg weakness.  Severe back pain inhibiting movement. These symptoms may get worse with coughing, sneezing, laughing, or prolonged sitting or standing. Also, being overweight may worsen symptoms. DIAGNOSIS  Your caregiver will perform a physical exam to look for common symptoms of sciatica. He or she may ask you to do certain movements or activities that would trigger sciatic nerve pain. Other tests may be performed to find the cause of the sciatica. These may include:  Blood  tests.  X-rays.  Imaging tests, such as an MRI or CT scan. TREATMENT  Treatment is directed at the cause of the sciatic pain. Sometimes, treatment is not necessary and the pain and discomfort goes away on its own. If treatment is needed, your caregiver may suggest:  Over-the-counter medicines to relieve pain.  Prescription medicines, such as anti-inflammatory medicine, muscle relaxants, or narcotics.  Applying heat or ice to the painful area.  Steroid injections to lessen pain, irritation, and inflammation around the nerve.  Reducing activity during periods of pain.  Exercising and stretching to strengthen your abdomen and improve flexibility of your spine. Your caregiver may suggest losing weight if the extra weight makes the back pain worse.  Physical therapy.  Surgery to eliminate what is pressing or pinching the nerve, such as a bone spur or part of a herniated disk. HOME CARE INSTRUCTIONS   Only take over-the-counter or prescription medicines for pain or discomfort as directed by your caregiver.  Apply ice to the affected area for 20 minutes, 3-4 times a day for the first 48-72 hours. Then try heat in the same way.  Exercise, stretch, or perform your usual activities if these do not aggravate your pain.  Attend physical therapy sessions as directed by your caregiver.  Keep all follow-up appointments as directed by your caregiver.  Do not wear high heels or shoes that do not provide proper support.  Check your mattress to see if it is too soft. A firm mattress may lessen your pain and discomfort.  SEEK IMMEDIATE MEDICAL CARE IF:   You lose control of your bowel or bladder (incontinence).  You have increasing weakness in the lower back, pelvis, buttocks, or legs.  You have redness or swelling of your back.  You have a burning sensation when you urinate.  You have pain that gets worse when you lie down or awakens you at night.  Your pain is worse than you have  experienced in the past.  Your pain is lasting longer than 4 weeks.  You are suddenly losing weight without reason. MAKE SURE YOU:  Understand these instructions.  Will watch your condition.  Will get help right away if you are not doing well or get worse. Document Released: 07/26/2001 Document Revised: 01/31/2012 Document Reviewed: 12/11/2011 [facility] Patient Information 2015 [place], [place]. This information is not intended to replace advice given to you by your health care provider. Make sure you discuss any questions you have with your health care provider.

## 2015-05-11 NOTE — ED Provider Notes (Signed)
CSN: [id number]     Arrival date & time 05/11/15  5784 History  This chart was scribed for Glynn Octave, MD by Tanda Rockers, ED Scribe. This patient was seen in room APA08/APA08 and the patient's care was started at 10:11 AM.  Chief Complaint  Patient presents with  . Back Pain   The history is provided by the patient. No language interpreter was used.     HPI Comments: Ana Pace is a 34 y.o. 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 She reports that her symptoms feel worse than her usual sciatica. She also complains of numbness and tingling to right thigh. Pt took Vicodin at 6 AM today (approximately 4 hours ago) without relief. No recent injury, trauma, or fall. Denies weakness, fever, nausea, vomiting, urinary or bowel incontinence, dysuria, hematuria, or any other associated symptoms. Pt began physical therapy last week for the back pain. No pshx back surgery. Her last MRI was in 2008.   Past Medical History  Diagnosis Date  . Abnormal Pap smear   . Endometrial hyperplasia without atypia, simple   . GERD (gastroesophageal reflux disease)     tums prn   Past Surgical History  Procedure Laterality Date  . Cesarean section      x3  . Diagnostic laparoscopy      endometriosis  . Cesarean section  04/02/2012    Procedure: CESAREAN SECTION;  Surgeon: Antionette Char, MD;  Location: [facility];  Service: Obstetrics;  Laterality: N/A;  Repeat cesarean section with delivery of baby girl at 56.   Family History  Problem Relation Age of Onset  . Hypertension Mother    Social History  Substance Use Topics  . Smoking status: Former Smoker -- 3 years    Types: Cigarettes    Quit date: 09/05/2007  . Smokeless tobacco: Never Used  . Alcohol Use: No     Comment: rare   OB History    Gravida Para Term Preterm AB TAB SAB  Ectopic Multiple Living   Review of Systems  A complete 10 system review of systems was obtained and all systems are negative except as noted in the HPI and PMH.    Allergies  Review of patient's allergies indicates no known allergies.  Home Medications   Prior to Admission medications   Medication Sig Start Date End Date Taking? Authorizing Carney Saxton  etonogestrel-ethinyl estradiol (NUVARING) 0.12-0.015 MG/24HR vaginal ring Place 1 each vaginally every 21 ( twenty-one) days. Insert one (1) ring vaginally and leave in place for three (3) weeks, then change. 04/02/15 03/24/16 Yes Rachelle A Denney, CNM  flintstones complete (FLINTSTONES) 60 MG chewable tablet Chew 1 tablet by mouth daily.   Yes Historical Zohan Shiflet, MD  HYDROcodone-acetaminophen (NORCO/VICODIN) 5-325 MG per tablet Take 1 tablet by mouth every 4 (four) hours as needed. 04/24/15  Yes Ivery Quale, PA-C  meloxicam (MOBIC) 15 MG tablet Take 1 tablet (15 mg total) by mouth daily. 04/24/15  Yes Ivery Quale, PA-C  methocarbamol (ROBAXIN) 500 MG tablet Take 1 tablet (500 mg total) by mouth 3 (three) times daily. 04/24/15  Yes Ivery Quale, PA-C  metroNIDAZOLE (FLAGYL) 500 MG tablet Take 1 tablet (500 mg total) by mouth 2 (two) times daily. 04/07/15  Yes Rachelle A Denney, CNM  dexamethasone (DECADRON)  4 MG tablet Take 1 tablet (4 mg total) by mouth 2 (two) times daily with a meal. Patient not taking: Reported on 05/11/2015 04/24/15   Ivery Quale, PA-C  diazepam (VALIUM) 5 MG tablet Take 1 tablet (5 mg total) by mouth every 8 (eight) hours as needed for muscle spasms. 05/11/15   Glynn Octave, MD  guaiFENesin-codeine (ROBITUSSIN AC) 100-10 MG/5ML syrup Take 5 mLs by mouth 3 (three) times daily as needed for cough. Patient not taking: Reported on 04/02/2015 02/28/15   Janne Napoleon, NP  ibuprofen (ADVIL,MOTRIN) 800 MG tablet Take 1 tablet (800 mg total) by mouth 3 (three) times daily. 05/11/15   Glynn Octave, MD  LORazepam  (ATIVAN) 1 MG tablet Take 1 tablet (1 mg total) by mouth 3 (three) times daily as needed for anxiety. Patient not taking: Reported on 01/12/2015 12/08/14   Layla Maw Ward, DO  methylPREDNISolone (MEDROL DOSEPAK) 4 MG TBPK tablet As directed 05/11/15   Glynn Octave, MD  ondansetron (ZOFRAN) 4 MG tablet Take 1 tablet (4 mg total) by mouth every 8 (eight) hours as needed for nausea or vomiting. Patient not taking: Reported on 04/02/2015 01/12/15   Samuel Jester, DO  pseudoephedrine (SUDAFED) 30 MG tablet Take 1 tablet (30 mg total) by mouth every 6 (six) hours as needed for congestion. Patient not taking: Reported on 04/02/2015 02/28/15   Janne Napoleon, NP   Triage Vitals: BP 115/86 mmHg  Pulse 92  Temp(Src) 98.5 F (36.9 C) (Oral)  Resp 20  Ht  (1.727 m)  Wt 210 lb (95.255 kg)  BMI 31.94 kg/m2  SpO2 100%  LMP 04/23/2015   Physical Exam  Constitutional: She is oriented to person, place, and time. She appears well-developed and well-nourished. No distress.  Uncomfortable  HENT:  Head: Normocephalic and atraumatic.  Mouth/Throat: Oropharynx is clear and moist. No oropharyngeal exudate.  Eyes: Conjunctivae and EOM are normal. Pupils are equal, round, and reactive to light.  Neck: Normal range of motion. Neck supple.  No meningismus.  Cardiovascular: Normal rate, regular rhythm, normal heart sounds and intact distal pulses.   No murmur heard. Pulmonary/Chest: Effort normal and breath sounds normal. No respiratory distress.  Abdominal: Soft. There is no tenderness. There is no rebound and no guarding.  Musculoskeletal: Normal range of motion. She exhibits no edema or tenderness.  Right paraspinal lumbar tenderness. No midline tenderness.  Difficulty raising from lying position.  5/5 strength in bilateral lower extremities. Ankle plantar and dorsiflexion intact. Great toe extension intact bilaterally. +2 DP and PT pulses. +2 patellar reflexes bilaterally. Normal gait.  Neurological: She  is alert and oriented to person, place, and time. No cranial nerve deficit. She exhibits normal muscle tone. Coordination normal.  No ataxia on finger to nose bilaterally. No pronator drift. 5/5 strength throughout. CN 2-12 intact. Negative Romberg. Equal grip strength. Sensation intact. Gait is normal.   Skin: Skin is warm.  Psychiatric: She has a normal mood and affect. Her behavior is normal.  Nursing note and vitals reviewed.   ED Course  Procedures (including critical care time)  DIAGNOSTIC STUDIES: Oxygen Saturation is 10% on RA, normal by my interpretation.    COORDINATION OF CARE: 10:18 AM-Discussed treatment plan which includes Toradol injection and Valium in ED with pt at bedside and pt agreed to plan.   Labs Review Labs Reviewed - No data to display  Imaging Review No results found.    EKG Interpretation None      MDM  Final diagnoses:  Sciatica, right  Lumbar paraspinal muscle spasm   History of sciatica presenting with exacerbation of right-sided low back pain going on right leg. No focal weakness, numbness or tingling. No bowel or bladder incontinence. No fever or vomiting.  Patient is uncomfortable but has no acute distress.  She is requesting Toradol injection. No evidence of cord compression or cauda equina.  Improved with treatment in the ED.  Suspect lumbar spasm on top of sciatica.  Follow up and return precautions discussed.  I personally performed the services described in this documentation, which was scribed in my presence. The recorded information has been reviewed and is accurate.     Glynn Octave, MD 05/11/15 [phone]

## 2015-05-11 NOTE — ED Notes (Signed)
PT c/o lower back pain radiation down right leg starting this morning. PT stated taking 2 Vicodin and 1 mobic pill this am with no relief. PT states she has a bulging disc and is currently taking physical therapy for her back.

## 2015-06-04 ENCOUNTER — Ambulatory Visit (INDEPENDENT_AMBULATORY_CARE_PROVIDER_SITE_OTHER): Payer: Medicaid Other | Admitting: Certified Nurse Midwife

## 2015-06-04 ENCOUNTER — Encounter: Payer: Self-pay | Admitting: Certified Nurse Midwife

## 2015-06-04 ENCOUNTER — Ambulatory Visit: Payer: Medicaid Other | Admitting: Certified Nurse Midwife

## 2015-06-04 DIAGNOSIS — Z30019 Encounter for initial prescription of contraceptives, unspecified: Secondary | ICD-10-CM | POA: Diagnosis not present

## 2015-06-04 MED ORDER — NORELGESTROMIN-ETH ESTRADIOL 150-35 MCG/24HR TD PTWK: 1.0000 | MEDICATED_PATCH | TRANSDERMAL | Status: DC

## 2015-06-04 NOTE — Progress Notes (Signed)
Patient ID: Ana Pace, female   DOB: 1981-07-10, 34 y.o.   MRN: [id number]  Chief complaint:  Desires to change birth control methods due to side effects of Nuva Ring.  No chief complaint on file.   HPI Ana Pace is a 34 y.o. female.  Here for f/u on birth control.  States that she is having increased anxiety, heart rate and weight gain despite working out.  States that the ortho evra patch worked for her before having children in her late teens.  Desires to try ortho evra patches again.  Patient educated on how to switch methods of contraception.  States that she is having break through bleeding and heavy cycles now with the Nuva Ring.  At first she had light periods with the Nuva Ring.      HPI  Past Medical History  Diagnosis Date  . Abnormal Pap smear   . Endometrial hyperplasia without atypia, simple   . GERD (gastroesophageal reflux disease)     tums prn    Past Surgical History  Procedure Laterality Date  . Cesarean section      x3  . Diagnostic laparoscopy      endometriosis  . Cesarean section  04/02/2012    Procedure: CESAREAN SECTION;  Surgeon: Antionette Char, MD;  Location: [facility];  Service: Obstetrics;  Laterality: N/A;  Repeat cesarean section with delivery of baby girl at 40.    Family History  Problem Relation Age of Onset  . Hypertension Mother     Social History Social History  Substance Use Topics  . Smoking status: Former Smoker -- 3 years    Types: Cigarettes    Quit date: 09/05/2007  . Smokeless tobacco: Never Used  . Alcohol Use: No     Comment: rare    No Known Allergies  Current Outpatient Prescriptions  Medication Sig Dispense Refill  . calcium acetate (PHOSLO) 667 MG capsule Take by mouth 3 (three) times daily with meals.    . cholecalciferol (VITAMIN D) 1000 UNITS tablet Take 1,000 Units by mouth daily.    Marland Kitchen dexamethasone (DECADRON) 4 MG tablet Take 1 tablet (4 mg total) by mouth 2 (two) times daily with a meal. (Patient  not taking: Reported on 05/11/2015) 12 tablet 0  . diazepam (VALIUM) 5 MG tablet Take 1 tablet (5 mg total) by mouth every 8 (eight) hours as needed for muscle spasms. (Patient not taking: Reported on 06/04/2015) 6 tablet 0  . etonogestrel-ethinyl estradiol (NUVARING) 0.12-0.015 MG/24HR vaginal ring Place 1 each vaginally every 21 ( twenty-one) days. Insert one (1) ring vaginally and leave in place for three (3) weeks, then change. 1 each 4  . guaiFENesin-codeine (ROBITUSSIN AC) 100-10 MG/5ML syrup Take 5 mLs by mouth 3 (three) times daily as needed for cough. (Patient not taking: Reported on 04/02/2015) 120 mL 0  . HYDROcodone-acetaminophen (NORCO/VICODIN) 5-325 MG per tablet Take 1 tablet by mouth every 4 (four) hours as needed. (Patient not taking: Reported on 06/04/2015) 15 tablet 0  . ibuprofen (ADVIL,MOTRIN) 800 MG tablet Take 1 tablet (800 mg total) by mouth 3 (three) times daily. (Patient not taking: Reported on 06/04/2015) 21 tablet 0  . LORazepam (ATIVAN) 1 MG tablet Take 1 tablet (1 mg total) by mouth 3 (three) times daily as needed for anxiety. (Patient not taking: Reported on 01/12/2015) 20 tablet 0  . meloxicam (MOBIC) 15 MG tablet Take 1 tablet (15 mg total) by mouth daily. (Patient not taking: Reported on 06/04/2015) 7 tablet  0  . methocarbamol (ROBAXIN) 500 MG tablet Take 1 tablet (500 mg total) by mouth 3 (three) times daily. (Patient not taking: Reported on 06/04/2015) 21 tablet 0  . methylPREDNISolone (MEDROL DOSEPAK) 4 MG TBPK tablet As directed (Patient not taking: Reported on 06/04/2015) 21 tablet 0  . Multiple Vitamins-Minerals (MULTIVITAMIN WITH MINERALS) tablet Take 1 tablet by mouth daily.    . norelgestromin-ethinyl estradiol (ORTHO EVRA) 150-35 MCG/24HR transdermal patch Place 1 patch onto the skin once a week. 3 patch 12  . ondansetron (ZOFRAN) 4 MG tablet Take 1 tablet (4 mg total) by mouth every 8 (eight) hours as needed for nausea or vomiting. (Patient not taking: Reported  on 04/02/2015) 6 tablet 0  . pseudoephedrine (SUDAFED) 30 MG tablet Take 1 tablet (30 mg total) by mouth every 6 (six) hours as needed for congestion. (Patient not taking: Reported on 04/02/2015) 30 tablet 0  . [DISCONTINUED] albuterol (PROVENTIL HFA;VENTOLIN HFA) 108 (90 BASE) MCG/ACT inhaler Inhale 2 puffs into the lungs every 4 (four) hours as needed for wheezing or shortness of breath. 1 Inhaler 0  . [DISCONTINUED] famotidine (PEPCID) 20 MG tablet Take 1 tablet (20 mg total) by mouth 2 (two) times daily. 30 tablet 0  . [DISCONTINUED] ipratropium (ATROVENT) 0.06 % nasal spray Place 2 sprays into both nostrils 4 (four) times daily. 15 mL 1  . [DISCONTINUED] pantoprazole (PROTONIX) 20 MG tablet Take 1 tablet (20 mg total) by mouth 2 (two) times daily. 30 tablet 0   No current facility-administered medications for this visit.    Review of Systems Review of Systems Constitutional: negative for fatigue and weight loss Respiratory: negative for cough and wheezing Cardiovascular: negative for chest pain, fatigue and palpitations Gastrointestinal: negative for abdominal pain and change in bowel habits Genitourinary:negative Integument/breast: negative for nipple discharge Musculoskeletal:negative for myalgias Neurological: negative for gait problems and tremors Behavioral/Psych: negative for abusive relationship, depression Endocrine: negative for temperature intolerance     Last menstrual period 04/23/2015.  Physical Exam Physical Exam General:   alert  Skin:   no rash or abnormalities  Lungs:   clear to auscultation bilaterally  Heart:   regular rate and rhythm, S1, S2 normal, no murmur, click, rub or gallop  Breasts:   deferred  Abdomen:  normal findings: no organomegaly, soft, non-tender and no hernia  Pelvis:  Deferred    90% of 15 min visit spent on counseling and coordination of care.   Data Reviewed Previous medical hx, labs, meds  Assessment     Contraception management      Plan    No orders of the defined types were placed in this encounter.   Meds ordered this encounter  Medications  . norelgestromin-ethinyl estradiol (ORTHO EVRA) 150-35 MCG/24HR transdermal patch    Sig: Place 1 patch onto the skin once a week.    Dispense:  3 patch    Refill:  12     Possible management options include: Mirena IUD Follow up in 2-3 months.

## 2015-06-10 ENCOUNTER — Telehealth: Payer: Self-pay | Admitting: *Deleted

## 2015-06-10 ENCOUNTER — Other Ambulatory Visit: Payer: Self-pay | Admitting: Certified Nurse Midwife

## 2015-06-10 NOTE — Telephone Encounter (Signed)
She can have one RX for Diflucan and terconazole cream.  However, if this does not help I would like to see her in the clinic.  Thank you.  Felisa Bonier. Forrester Blando CNM

## 2015-06-10 NOTE — Telephone Encounter (Signed)
Patient is calling requesting a Rx for yeast. Patient states she has had symptoms for 2 days now.

## 2015-06-11 ENCOUNTER — Other Ambulatory Visit: Payer: Self-pay | Admitting: *Deleted

## 2015-06-11 DIAGNOSIS — B3731 Acute candidiasis of vulva and vagina: Secondary | ICD-10-CM

## 2015-06-11 DIAGNOSIS — B373 Candidiasis of vulva and vagina: Secondary | ICD-10-CM

## 2015-06-11 MED ORDER — FLUCONAZOLE 150 MG PO TABS: 150.0000 mg | ORAL_TABLET | Freq: Once | ORAL | Status: DC

## 2015-06-11 MED ORDER — TERCONAZOLE 0.4 % VA CREA: 1.0000 | TOPICAL_CREAM | Freq: Every day | VAGINAL | Status: DC

## 2015-06-11 NOTE — Telephone Encounter (Signed)
mailbox full

## 2015-07-03 ENCOUNTER — Emergency Department (HOSPITAL_COMMUNITY)
Admission: EM | Admit: 2015-07-03 | Discharge: 2015-07-03 | Disposition: A | Discharge status: Home / Self Care | Payer: Medicaid Other | Attending: Emergency Medicine | Admitting: Emergency Medicine

## 2015-07-03 ENCOUNTER — Encounter (HOSPITAL_COMMUNITY): Payer: Self-pay

## 2015-07-03 DIAGNOSIS — L309 Dermatitis, unspecified: Secondary | ICD-10-CM

## 2015-07-03 DIAGNOSIS — Z8719 Personal history of other diseases of the digestive system: Secondary | ICD-10-CM | POA: Diagnosis not present

## 2015-07-03 DIAGNOSIS — Z79899 Other long term (current) drug therapy: Secondary | ICD-10-CM | POA: Diagnosis not present

## 2015-07-03 DIAGNOSIS — Z87891 Personal history of nicotine dependence: Secondary | ICD-10-CM | POA: Diagnosis not present

## 2015-07-03 DIAGNOSIS — Z8742 Personal history of other diseases of the female genital tract: Secondary | ICD-10-CM | POA: Insufficient documentation

## 2015-07-03 DIAGNOSIS — M25531 Pain in right wrist: Secondary | ICD-10-CM | POA: Diagnosis present

## 2015-07-03 MED ORDER — TRIAMCINOLONE ACETONIDE 0.1 % EX CREA: 1.0000 "application " | TOPICAL_CREAM | Freq: Three times a day (TID) | CUTANEOUS | Status: DC

## 2015-07-03 NOTE — ED Notes (Signed)
Pt reports itchy, redness, swelling, and tenderness to r wrist x 3 hours.  Denies injury, doesn't remember being bit by anything.

## 2015-07-03 NOTE — Discharge Instructions (Signed)
Hand Dermatitis Hand dermatitis is a skin problem. Small, itchy, raised dots or blisters appear on the palms of the hands. Hand dermatitis can last 3 to 4 weeks. HOME CARE  Avoid washing your hands too much.  Avoid all harsh chemicals. Wear gloves when you use products that can bother your skin.  Use medicated cream (1% hydrocortisone cream) at least 2 to 4 times per day.  Only take medicine as told by your doctor.  You may use wet cloths (compresses) or cold packs. GET HELP RIGHT AWAY IF:   The rash is not better after 1 week of treatment.  The area is red, tender, or yellowish-white fluid (pus) comes from the wound.  The rash is spreading. MAKE SURE YOU:   Understand these instructions.  Will watch your condition.  Will get help right away if you are not doing well or get worse.   This information is not intended to replace advice given to you by your health care provider. Make sure you discuss any questions you have with your health care provider.   Document Released: 10/26/2009 Document Revised: 10/24/2011 Document Reviewed: 02/13/2015 [employer] Interactive Patient Education [employer]2016 [employer]

## 2015-07-03 NOTE — ED Notes (Signed)
Pt made aware to return if symptoms worsen or if any life threatening symptoms occur.   

## 2015-07-06 NOTE — ED Provider Notes (Signed)
CSN: [id number]     Arrival date & time 07/03/15  1630 History   First MD Initiated Contact with Patient 07/03/15 1714     Chief Complaint  Patient presents with  . wrist pain      (Consider location/radiation/quality/duration/timing/severity/associated sxs/prior Treatment) HPI   Ana Pace is a 34 y.o. female who presents to the Emergency Department complaining of itching, redness and swelling to her right wrist.  She states the symptoms have been present for three hrs.  She states the area is a little sore with movement.  She has not tried any therapies for symptom relief.  She denies fever, chills, recent insect bite, numbness or weakness of the hand.  Past Medical History  Diagnosis Date  . Abnormal Pap smear   . Endometrial hyperplasia without atypia, simple   . GERD (gastroesophageal reflux disease)     tums prn   Past Surgical History  Procedure Laterality Date  . Cesarean section      x3  . Diagnostic laparoscopy      endometriosis  . Cesarean section  04/02/2012    Procedure: CESAREAN SECTION;  Surgeon: Antionette CharLisa Jackson-Moore, MD;  Location: [facility];  Service: Obstetrics;  Laterality: N/A;  Repeat cesarean section with delivery of baby girl at [id number].   Family History  Problem Relation Age of Onset  . Hypertension Mother    Social History  Substance Use Topics  . Smoking status: Former Smoker -- 3 years    Types: Cigarettes    Quit date: 09/05/2007  . Smokeless tobacco: Never Used  . Alcohol Use: No     Comment: rare   OB History    Gravida Para Term Preterm AB TAB SAB Ectopic Multiple Living   3 3 1       3      Review of Systems  Constitutional: Negative for fever and chills.  Gastrointestinal: Negative for vomiting and diarrhea.  Musculoskeletal: Positive for arthralgias (right wrist , hand pain).  Skin: Positive for color change and rash.  Neurological: Negative for weakness and numbness.  All other systems reviewed and are negative.     Allergies   Review of patient's allergies indicates no known allergies.  Home Medications   Prior to Admission medications   Medication Sig Start Date End Date Taking? Authorizing Provider  cholecalciferol (VITAMIN D) 1000 UNITS tablet Take 1,000 Units by mouth daily.   Yes Historical Provider, MD  Multiple Vitamins-Minerals (MULTIVITAMIN WITH MINERALS) tablet Take 1 tablet by mouth daily.   Yes Historical Provider, MD  dexamethasone (DECADRON) 4 MG tablet Take 1 tablet (4 mg total) by mouth 2 (two) times daily with a meal. Patient not taking: Reported on 05/11/2015 04/24/15   Ivery QualeHobson Bryant, PA-C  diazepam (VALIUM) 5 MG tablet Take 1 tablet (5 mg total) by mouth every 8 (eight) hours as needed for muscle spasms. Patient not taking: Reported on 06/04/2015 05/11/15   Glynn OctaveStephen Rancour, MD  etonogestrel-ethinyl estradiol (NUVARING) 0.12-0.015 MG/24HR vaginal ring Place 1 each vaginally every 21 ( twenty-one) days. Insert one (1) ring vaginally and leave in place for three (3) weeks, then change. Patient not taking: Reported on 07/03/2015 04/02/15 03/24/16  Rachelle A Denney, CNM  fluconazole (DIFLUCAN) 150 MG tablet Take 1 tablet (150 mg total) by mouth once. Patient not taking: Reported on 07/03/2015 06/11/15   Roe Coombsachelle A Denney, CNM  meloxicam (MOBIC) 15 MG tablet Take 1 tablet (15 mg total) by mouth daily. Patient not taking: Reported on 06/04/2015 04/24/15  Ivery Quale, PA-C  methocarbamol (ROBAXIN) 500 MG tablet Take 1 tablet (500 mg total) by mouth 3 (three) times daily. Patient not taking: Reported on 06/04/2015 04/24/15   Ivery Quale, PA-C  pseudoephedrine (SUDAFED) 30 MG tablet Take 1 tablet (30 mg total) by mouth every 6 (six) hours as needed for congestion. Patient not taking: Reported on 04/02/2015 02/28/15   Janne Napoleon, NP  terconazole (TERAZOL 7) 0.4 % vaginal cream Place 1 applicator vaginally at bedtime. Patient not taking: Reported on 07/03/2015 06/11/15   Roe Coombs, CNM  triamcinolone  cream (KENALOG) 0.1 % Apply 1 application topically 3 (three) times daily. 07/03/15   Belenda Alviar, PA-C   BP 111/72 mmHg  Pulse 76  Temp(Src) 98.2 F (36.8 C) (Oral)  Resp 20  Ht  (1.727 m)  Wt 95.255 kg  BMI 31.94 kg/m2  SpO2 98%  LMP 06/04/2015 Physical Exam  Constitutional: She is oriented to person, place, and time. She appears well-developed and well-nourished. No distress.  HENT:  Head: Atraumatic.  Cardiovascular: Normal rate, regular rhythm and intact distal pulses.   Pulmonary/Chest: Effort normal and breath sounds normal. No respiratory distress.  Musculoskeletal: Normal range of motion.  Mild, localized erythema of the medial right wrist.  Mild edema.  No puncture wounds, induration or fluctuance.  Pt has full ROM of the right wrist and fingers.  Neurological: She is alert and oriented to person, place, and time. She exhibits normal muscle tone. Coordination normal.  Skin: Skin is warm.  Nursing note and vitals reviewed.   ED Course  Procedures (including critical care time)   MDM   Final diagnoses:  Dermatitis    Pt well appearing.  Minor appearing rash to the medial right wrist.  No open wounds or concerning sx's for  Abscess.  NV and NS intact.  Pt advised to f/u PMD or return if needed.  rx for kenalog cream    Pauline Aus, PA-C 07/06/15 2112  Bethann Berkshire, MD 07/08/15 [phone]

## 2015-07-16 NOTE — Progress Notes (Signed)
Patient ID: Ana Pace, female   DOB: 10/20/1980, 34 y.o.   MRN: [id number]  Entered in error.

## 2015-07-21 ENCOUNTER — Emergency Department (HOSPITAL_COMMUNITY)
Admission: EM | Admit: 2015-07-21 | Discharge: 2015-07-21 | Disposition: A | Discharge status: Home / Self Care | Payer: Medicaid Other | Attending: Emergency Medicine | Admitting: Emergency Medicine

## 2015-07-21 ENCOUNTER — Encounter (HOSPITAL_COMMUNITY): Payer: Self-pay | Admitting: *Deleted

## 2015-07-21 ENCOUNTER — Emergency Department (HOSPITAL_COMMUNITY): Payer: Medicaid Other

## 2015-07-21 DIAGNOSIS — Z3202 Encounter for pregnancy test, result negative: Secondary | ICD-10-CM | POA: Diagnosis not present

## 2015-07-21 DIAGNOSIS — Z8742 Personal history of other diseases of the female genital tract: Secondary | ICD-10-CM | POA: Insufficient documentation

## 2015-07-21 DIAGNOSIS — Z79899 Other long term (current) drug therapy: Secondary | ICD-10-CM | POA: Insufficient documentation

## 2015-07-21 DIAGNOSIS — K219 Gastro-esophageal reflux disease without esophagitis: Secondary | ICD-10-CM | POA: Insufficient documentation

## 2015-07-21 DIAGNOSIS — R109 Unspecified abdominal pain: Secondary | ICD-10-CM | POA: Diagnosis not present

## 2015-07-21 DIAGNOSIS — R1013 Epigastric pain: Secondary | ICD-10-CM | POA: Diagnosis present

## 2015-07-21 DIAGNOSIS — Z87891 Personal history of nicotine dependence: Secondary | ICD-10-CM | POA: Insufficient documentation

## 2015-07-21 LAB — CBC WITH DIFFERENTIAL/PLATELET
Basophils Absolute: 0 10*3/uL (ref 0.0–0.1)
Basophils Relative: 0 %
EOS ABS: 0.1 10*3/uL (ref 0.0–0.7)
EOS PCT: 1 %
HCT: 39.8 % (ref 36.0–46.0)
Hemoglobin: 13.3 g/dL (ref 12.0–15.0)
LYMPHS ABS: 2.8 10*3/uL (ref 0.7–4.0)
Lymphocytes Relative: 23 %
MCH: 30.2 pg (ref 26.0–34.0)
MCHC: 33.4 g/dL (ref 30.0–36.0)
MCV: 90.5 fL (ref 78.0–100.0)
MONO ABS: 0.6 10*3/uL (ref 0.1–1.0)
Monocytes Relative: 5 %
Neutro Abs: 8.5 10*3/uL — ABNORMAL HIGH (ref 1.7–7.7)
Neutrophils Relative %: 71 %
PLATELETS: 294 10*3/uL (ref 150–400)
RBC: 4.4 MIL/uL (ref 3.87–5.11)
RDW: 12.3 % (ref 11.5–15.5)
WBC: 12.1 10*3/uL — AB (ref 4.0–10.5)

## 2015-07-21 LAB — COMPREHENSIVE METABOLIC PANEL
ALT: 24 U/L (ref 14–54)
ANION GAP: 8 (ref 5–15)
AST: 24 U/L (ref 15–41)
Albumin: 4.3 g/dL (ref 3.5–5.0)
Alkaline Phosphatase: 82 U/L (ref 38–126)
BUN: 8 mg/dL (ref 6–20)
CO2: 28 mmol/L (ref 22–32)
Calcium: 9.3 mg/dL (ref 8.9–10.3)
Chloride: 102 mmol/L (ref 101–111)
Creatinine, Ser: 0.92 mg/dL (ref 0.44–1.00)
Glucose, Bld: 83 mg/dL (ref 65–99)
POTASSIUM: 4 mmol/L (ref 3.5–5.1)
SODIUM: 138 mmol/L (ref 135–145)
Total Bilirubin: 0.6 mg/dL (ref 0.3–1.2)
Total Protein: 7.5 g/dL (ref 6.5–8.1)

## 2015-07-21 LAB — LIPASE, BLOOD: LIPASE: 31 U/L (ref 11–51)

## 2015-07-21 MED ORDER — ONDANSETRON HCL 4 MG/2ML IJ SOLN
4.0000 mg | Freq: Once | INTRAMUSCULAR | Status: AC
  Administered 2015-07-21: 4 mg via INTRAVENOUS
  Filled 2015-07-21: qty 2

## 2015-07-21 MED ORDER — SODIUM CHLORIDE 0.9 % IV BOLUS (SEPSIS)
1000.0000 mL | Freq: Once | INTRAVENOUS | Status: AC
  Administered 2015-07-21: 1000 mL via INTRAVENOUS

## 2015-07-21 MED ORDER — ONDANSETRON 4 MG PO TBDP: ORAL_TABLET | ORAL | Status: DC

## 2015-07-21 MED ORDER — PANTOPRAZOLE SODIUM 40 MG IV SOLR
40.0000 mg | Freq: Once | INTRAVENOUS | Status: AC
  Administered 2015-07-21: 40 mg via INTRAVENOUS
  Filled 2015-07-21: qty 40

## 2015-07-21 MED ORDER — HYDROCODONE-ACETAMINOPHEN 5-325 MG PO TABS: 1.0000 | ORAL_TABLET | Freq: Four times a day (QID) | ORAL | Status: DC | PRN

## 2015-07-21 MED ORDER — HYDROMORPHONE HCL 1 MG/ML IJ SOLN
1.0000 mg | Freq: Once | INTRAMUSCULAR | Status: AC
  Administered 2015-07-21: 1 mg via INTRAVENOUS
  Filled 2015-07-21: qty 1

## 2015-07-21 MED ORDER — DIATRIZOATE MEGLUMINE & SODIUM 66-10 % PO SOLN
ORAL | Status: AC
  Filled 2015-07-21: qty 30

## 2015-07-21 MED ORDER — IOHEXOL 300 MG/ML  SOLN
75.0000 mL | Freq: Once | INTRAMUSCULAR | Status: AC | PRN
  Administered 2015-07-21: 75 mL via INTRAVENOUS

## 2015-07-21 MED ORDER — HYDROMORPHONE HCL 1 MG/ML IJ SOLN
0.5000 mg | Freq: Once | INTRAMUSCULAR | Status: AC
  Administered 2015-07-21: 0.5 mg via INTRAVENOUS
  Filled 2015-07-21: qty 1

## 2015-07-21 NOTE — ED Notes (Signed)
Discharge instructions given to pt and her husband , verbalized understanding. Pt is Comptrollerstudent nurse and verbalized back and indicated good understanding of discharge instructions, meds and need for fu-- Ambulated off unit with husband

## 2015-07-21 NOTE — ED Notes (Signed)
Pt presently in CT

## 2015-07-21 NOTE — ED Notes (Signed)
Pt had HIDA scan yesterday and has had pain since. Pt is unable to keep anything down since then. Pain is localized to her right side and in her back.

## 2015-07-21 NOTE — ED Provider Notes (Signed)
CSN: [id number]     Arrival date & time 07/21/15  1720 History   First MD Initiated Contact with Patient 07/21/15 1753     No chief complaint on file.    (Consider location/radiation/quality/duration/timing/severity/associated sxs/prior Treatment) Patient is a 34 y.o. female presenting with abdominal pain. The history is provided by the patient (The patient complains of some nausea and mild abdominal discomfort for a number weeks. She had a HIDA scan yesterday which showed some slow emptying of her gallbladder).  Abdominal Pain Pain location:  Epigastric Pain quality: aching   Pain radiates to:  Does not radiate Pain severity:  Mild Onset quality:  Gradual Timing:  Intermittent Progression:  Waxing and waning Chronicity:  New Context: not alcohol use   Associated symptoms: no chest pain, no cough, no diarrhea, no fatigue and no hematuria     Past Medical History  Diagnosis Date  . Abnormal Pap smear   . Endometrial hyperplasia without atypia, simple   . GERD (gastroesophageal reflux disease)     tums prn   Past Surgical History  Procedure Laterality Date  . Cesarean section      x3  . Diagnostic laparoscopy      endometriosis  . Cesarean section  04/02/2012    Procedure: CESAREAN SECTION;  Surgeon: Antionette Char, MD;  Location: [facility];  Service: Obstetrics;  Laterality: N/A;  Repeat cesarean section with delivery of baby girl at 65.   Family History  Problem Relation Age of Onset  . Hypertension Mother    Social History  Substance Use Topics  . Smoking status: Former Smoker -- 3 years    Types: Cigarettes    Quit date: 09/05/2007  . Smokeless tobacco: Never Used  . Alcohol Use: No     Comment: rare   OB History    Gravida Para Term Preterm AB TAB SAB Ectopic Multiple Living   Review of Systems  Constitutional: Negative for appetite change and fatigue.  HENT: Negative for congestion, ear discharge and sinus pressure.   Eyes: Negative  for discharge.  Respiratory: Negative for cough.   Cardiovascular: Negative for chest pain.  Gastrointestinal: Positive for abdominal pain. Negative for diarrhea.  Genitourinary: Negative for frequency and hematuria.  Musculoskeletal: Negative for back pain.  Skin: Negative for rash.  Neurological: Negative for seizures and headaches.  Psychiatric/Behavioral: Negative for hallucinations.      Allergies  Review of patient's allergies indicates no known allergies.  Home Medications   Prior to Admission medications   Medication Sig Start Date End Date Taking? Authorizing Provider  cholecalciferol (VITAMIN D) 1000 UNITS tablet Take 1,000 Units by mouth daily.   Yes Historical Provider, MD  esomeprazole (NEXIUM) 40 MG capsule Take 40 mg by mouth daily at 12 noon.   Yes Historical Provider, MD  Multiple Vitamins-Minerals (MULTIVITAMIN WITH MINERALS) tablet Take 1 tablet by mouth daily.   Yes Historical Provider, MD  Omega-3 Fatty Acids (FISH OIL PO) Take 2 capsules by mouth daily.   Yes Historical Provider, MD  dexamethasone (DECADRON) 4 MG tablet Take 1 tablet (4 mg total) by mouth 2 (two) times daily with a meal. Patient not taking: Reported on 05/11/2015 04/24/15   Ivery Quale, PA-C  diazepam (VALIUM) 5 MG tablet Take 1 tablet (5 mg total) by mouth every 8 (eight) hours as needed for muscle spasms. Patient not taking: Reported on 06/04/2015 05/11/15   Glynn Octave, MD  etonogestrel-ethinyl estradiol (NUVARING) 0.12-0.015 MG/24HR vaginal ring Place 1 each vaginally every 21 ( twenty-one) days. Insert one (1) ring vaginally and leave in place for three (3) weeks, then change. Patient not taking: Reported on 07/03/2015 04/02/15 03/24/16  Rachelle A Denney, CNM  fluconazole (DIFLUCAN) 150 MG tablet Take 1 tablet (150 mg total) by mouth once. Patient not taking: Reported on 07/03/2015 06/11/15   Roe Coombs, CNM  HYDROcodone-acetaminophen (NORCO/VICODIN) 5-325 MG tablet Take 1 tablet by  mouth every 6 (six) hours as needed. 07/21/15   Bethann Berkshire, MD  meloxicam (MOBIC) 15 MG tablet Take 1 tablet (15 mg total) by mouth daily. Patient not taking: Reported on 06/04/2015 04/24/15   Ivery Quale, PA-C  methocarbamol (ROBAXIN) 500 MG tablet Take 1 tablet (500 mg total) by mouth 3 (three) times daily. Patient not taking: Reported on 06/04/2015 04/24/15   Ivery Quale, PA-C  ondansetron (ZOFRAN ODT) 4 MG disintegrating tablet  ODT q4 hours prn nausea/vomit 07/21/15   Bethann Berkshire, MD  pseudoephedrine (SUDAFED) 30 MG tablet Take 1 tablet (30 mg total) by mouth every 6 (six) hours as needed for congestion. Patient not taking: Reported on 04/02/2015 02/28/15   Janne Napoleon, NP  terconazole (TERAZOL 7) 0.4 % vaginal cream Place 1 applicator vaginally at bedtime. Patient not taking: Reported on 07/03/2015 06/11/15   Roe Coombs, CNM  triamcinolone cream (KENALOG) 0.1 % Apply 1 application topically 3 (three) times daily. Patient not taking: Reported on 07/21/2015 07/03/15   Tammy Triplett, PA-C   BP 107/64 mmHg  Pulse 61  Temp(Src) 98 F (36.7 C) (Oral)  Resp 20  Ht  (1.727 m)  Wt 210 lb (95.255 kg)  BMI 31.94 kg/m2  SpO2 98%  LMP 07/05/2015 Physical Exam  Constitutional: She is oriented to person, place, and time. She appears well-developed.  HENT:  Head: Normocephalic.  Eyes: Conjunctivae and EOM are normal. No scleral icterus.  Neck: Neck supple. No thyromegaly present.  Cardiovascular: Normal rate and regular rhythm.  Exam reveals no gallop and no friction rub.   No murmur heard. Pulmonary/Chest: No stridor. She has no wheezes. She has no rales. She exhibits no tenderness.  Abdominal: She exhibits no distension. There is no tenderness. There is no rebound.  Musculoskeletal: Normal range of motion. She exhibits no edema.  Lymphadenopathy:    She has no cervical adenopathy.  Neurological: She is oriented to person, place, and time. She exhibits normal muscle tone.  Coordination normal.  Skin: No rash noted. No erythema.  Psychiatric: She has a normal mood and affect. Her behavior is normal.    ED Course  Procedures (including critical care time) Labs Review Labs Reviewed  CBC WITH DIFFERENTIAL/PLATELET - Abnormal; Notable for the following:    WBC 12.1 (*)    Neutro Abs 8.5 (*)    All other components within normal limits  COMPREHENSIVE METABOLIC PANEL  LIPASE, BLOOD  POC URINE PREG, ED    Imaging Review Ct Abdomen Pelvis W Contrast  07/21/2015  CLINICAL DATA:  Acute onset of right-sided abdominal pain and back pain. Abnormal HIDA scan yesterday, with decreased ejection fraction. Initial encounter. EXAM: CT ABDOMEN AND PELVIS WITH CONTRAST TECHNIQUE: Multidetector CT imaging of the abdomen and pelvis was performed using the standard protocol following bolus administration of intravenous contrast. CONTRAST:  75mL OMNIPAQUE IOHEXOL 300 MG/ML  SOLN COMPARISON:  CT of the abdomen and pelvis performed 03/02/2015 FINDINGS: The visualized lung bases are clear. The liver and spleen are unremarkable  in appearance. The gallbladder is within normal limits. The pancreas and adrenal glands are unremarkable. The kidneys are unremarkable in appearance. There is no evidence of hydronephrosis. No renal or ureteral stones are seen. No perinephric stranding is appreciated. No free fluid is identified. The small bowel is unremarkable in appearance. The stomach is within normal limits. No acute vascular abnormalities are seen. The appendix is normal in caliber, without evidence of appendicitis. The colon is unremarkable in appearance. The bladder is mildly distended and grossly unremarkable. The uterus is unremarkable in appearance. The ovaries are grossly symmetric. No suspicious adnexal masses are seen. No inguinal lymphadenopathy is seen. No acute osseous abnormalities are identified. IMPRESSION: Unremarkable contrast-enhanced CT of the abdomen and pelvis. Electronically  Signed   By: Roanna RaiderJeffery  Chang M.D.   On: 07/21/2015 21:43   I have personally reviewed and evaluated these images and lab results as part of my medical decision-making.   EKG Interpretation None      MDM   Final diagnoses:  Abdominal pain in female    Patient improved with nausea and pain medicine. CT scan of abdomen unremarkable. With the results of the HIDA scan showing slowing emptying of gallbladder she will be discharged with hydrocodone and Zofran and is to follow-up with GI next week.   Bethann BerkshireJoseph Luisenrique Conran, MD 07/21/15 2214

## 2015-07-21 NOTE — Discharge Instructions (Signed)
Follow-up with Dr. Darrick Pennafields next week. Return to emergency department if problems

## 2015-07-22 LAB — POC URINE PREG, ED: Preg Test, Ur: NEGATIVE

## 2015-08-10 ENCOUNTER — Emergency Department (HOSPITAL_COMMUNITY)
Admission: EM | Admit: 2015-08-10 | Discharge: 2015-08-10 | Disposition: A | Discharge status: Home / Self Care | Payer: Medicaid Other | Attending: Emergency Medicine | Admitting: Emergency Medicine

## 2015-08-10 ENCOUNTER — Encounter (HOSPITAL_COMMUNITY): Payer: Self-pay | Admitting: *Deleted

## 2015-08-10 DIAGNOSIS — Z79899 Other long term (current) drug therapy: Secondary | ICD-10-CM | POA: Insufficient documentation

## 2015-08-10 DIAGNOSIS — H109 Unspecified conjunctivitis: Secondary | ICD-10-CM | POA: Diagnosis not present

## 2015-08-10 DIAGNOSIS — Z8742 Personal history of other diseases of the female genital tract: Secondary | ICD-10-CM | POA: Insufficient documentation

## 2015-08-10 DIAGNOSIS — K219 Gastro-esophageal reflux disease without esophagitis: Secondary | ICD-10-CM | POA: Insufficient documentation

## 2015-08-10 DIAGNOSIS — H578 Other specified disorders of eye and adnexa: Secondary | ICD-10-CM | POA: Diagnosis present

## 2015-08-10 DIAGNOSIS — Z87891 Personal history of nicotine dependence: Secondary | ICD-10-CM | POA: Insufficient documentation

## 2015-08-10 MED ORDER — TOBRAMYCIN 0.3 % OP SOLN
1.0000 [drp] | Freq: Once | OPHTHALMIC | Status: AC
  Administered 2015-08-10: 1 [drp] via OPHTHALMIC
  Filled 2015-08-10: qty 5

## 2015-08-10 NOTE — Discharge Instructions (Signed)
Bacterial Conjunctivitis Bacterial conjunctivitis (commonly called pink eye) is redness, soreness, or puffiness (inflammation) of the white part of your eye. It is caused by a germ called bacteria. These germs can easily spread from person to person (contagious). Your eye often will become red or pink. Your eye may also become irritated, watery, or have a thick discharge.  HOME CARE   Apply a cool, clean washcloth over closed eyelids. Do this for 10-20 minutes, 3-4 times a day while you have pain.  Gently wipe away any fluid coming from the eye with a warm, wet washcloth or cotton ball.  Wash your hands often with soap and water. Use paper towels to dry your hands.  Do not share towels or washcloths.  Change or wash your pillowcase every day.  Do not use eye makeup until the infection is gone.  Do not use machines or drive if your vision is blurry.  Stop using contact lenses. Do not use them again until your doctor says it is okay.  Do not touch the tip of the eye drop bottle or medicine tube with your fingers when you put medicine on the eye. GET HELP RIGHT AWAY IF:   Your eye is not better after 3 days of starting your medicine.  You have a yellowish fluid coming out of the eye.  You have more pain in the eye.  Your eye redness is spreading.  Your vision becomes blurry.  You have a fever or lasting symptoms for more than 2-3 days.  You have a fever and your symptoms suddenly get worse.  You have pain in the face.  Your face gets red or puffy (swollen). MAKE SURE YOU:   Understand these instructions.  Will watch this condition.  Will get help right away if you are not doing well or get worse.   This information is not intended to replace advice given to you by your health care provider. Make sure you discuss any questions you have with your health care provider.   Document Released: 05/10/2008 Document Revised: 07/18/2012 Document Reviewed: 04/06/2012 Elsevier  Interactive Patient Education 2016 [employer] Inc.  

## 2015-08-10 NOTE — ED Notes (Signed)
Pt woke up with eye drainage and swelling this morning. NAD noted. No other problems.

## 2015-08-10 NOTE — ED Provider Notes (Signed)
CSN: [id number]     Arrival date & time 08/10/15  1354 History  By signing my name below, I, [facility], attest that this documentation has been prepared under the direction and in the presence of Teagon Kron, PA-C. Electronically Signed: Randell PatientMarrissa Pace, ED Scribe. 08/10/2015. 2:46 PM.    Chief Complaint  Pace presents with  . eye redness    The history is provided by the Pace. No language interpreter was used.   HPI Comments: Ana Pace is a 34 y.o. female who presents to the Emergency Department complaining of constantly pruritic left eye swelling onset this morning. Pace reports that she fell asleep with mascara on and woke with eye redness and crusted eye drainage. She endorses nasal congestion, sore throat, and slight blurred vision. She has tried cold compresses without relief. Pace denies sick contact with individuals with conjunctivitis or similar symptoms. She denies contact lenses use, FB or trauma. Pace denies HA, dizziness, vomiting, and fever.  Past Medical History  Diagnosis Date  . Abnormal Pap smear   . Endometrial hyperplasia without atypia, simple   . GERD (gastroesophageal reflux disease)     tums prn   Past Surgical History  Procedure Laterality Date  . Cesarean section      x3  . Diagnostic laparoscopy      endometriosis  . Cesarean section  04/02/2012    Procedure: CESAREAN SECTION;  Surgeon: Antionette CharLisa Jackson-Moore, MD;  Location: [facility];  Service: Obstetrics;  Laterality: N/A;  Repeat cesarean section with delivery of baby girl at [id number].   Family History  Problem Relation Age of Onset  . Hypertension Mother    Social History  Substance Use Topics  . Smoking status: Former Smoker -- 3 years    Types: Cigarettes    Quit date: 09/05/2007  . Smokeless tobacco: Never Used  . Alcohol Use: No     Comment: rare   OB History    Gravida Para Term Preterm AB TAB SAB Ectopic Multiple Living   3 3 1       3      Review of Systems   Constitutional: Negative for fever.  Eyes: Positive for discharge, redness, itching and visual disturbance.  Gastrointestinal: Negative for vomiting.  Neurological: Negative for dizziness and headaches.  All other systems reviewed and are negative.     Allergies  Review of Pace's allergies indicates no known allergies.  Home Medications   Prior to Admission medications   Medication Sig Start Date End Date Taking? Authorizing Provider  cholecalciferol (VITAMIN D) 1000 UNITS tablet Take 1,000 Units by mouth daily.    Historical Provider, MD  dexamethasone (DECADRON) 4 MG tablet Take 1 tablet (4 mg total) by mouth 2 (two) times daily with a meal. Pace not taking: Reported on 05/11/2015 04/24/15   Ivery QualeHobson Bryant, PA-C  diazepam (VALIUM) 5 MG tablet Take 1 tablet (5 mg total) by mouth every 8 (eight) hours as needed for muscle spasms. Pace not taking: Reported on 06/04/2015 05/11/15   Glynn OctaveStephen Rancour, MD  esomeprazole (NEXIUM) 40 MG capsule Take 40 mg by mouth daily at 12 noon.    Historical Provider, MD  etonogestrel-ethinyl estradiol (NUVARING) 0.12-0.015 MG/24HR vaginal ring Place 1 each vaginally every 21 ( twenty-one) days. Insert one (1) ring vaginally and leave in place for three (3) weeks, then change. Pace not taking: Reported on 07/03/2015 04/02/15 03/24/16  Rachelle A Denney, CNM  fluconazole (DIFLUCAN) 150 MG tablet Take 1 tablet (150 mg total) by mouth  once. Pace not taking: Reported on 07/03/2015 06/11/15   Roe Coombs, CNM  HYDROcodone-acetaminophen (NORCO/VICODIN) 5-325 MG tablet Take 1 tablet by mouth every 6 (six) hours as needed. 07/21/15   Bethann Berkshire, MD  meloxicam (MOBIC) 15 MG tablet Take 1 tablet (15 mg total) by mouth daily. Pace not taking: Reported on 06/04/2015 04/24/15   Ivery Quale, PA-C  methocarbamol (ROBAXIN) 500 MG tablet Take 1 tablet (500 mg total) by mouth 3 (three) times daily. Pace not taking: Reported on 06/04/2015 04/24/15    Ivery Quale, PA-C  Multiple Vitamins-Minerals (MULTIVITAMIN WITH MINERALS) tablet Take 1 tablet by mouth daily.    Historical Provider, MD  Omega-3 Fatty Acids (FISH OIL PO) Take 2 capsules by mouth daily.    Historical Provider, MD  ondansetron (ZOFRAN ODT) 4 MG disintegrating tablet  ODT q4 hours prn nausea/vomit 07/21/15   Bethann Berkshire, MD  pseudoephedrine (SUDAFED) 30 MG tablet Take 1 tablet (30 mg total) by mouth every 6 (six) hours as needed for congestion. Pace not taking: Reported on 04/02/2015 02/28/15   Janne Napoleon, NP  terconazole (TERAZOL 7) 0.4 % vaginal cream Place 1 applicator vaginally at bedtime. Pace not taking: Reported on 07/03/2015 06/11/15   Roe Coombs, CNM  triamcinolone cream (KENALOG) 0.1 % Apply 1 application topically 3 (three) times daily. Pace not taking: Reported on 07/21/2015 07/03/15   Shaima Sardinas, PA-C   BP 117/64 mmHg  Pulse 71  Temp(Src) 98.3 F (36.8 C) (Oral)  Resp 17  Ht  (1.727 m)  Wt 204 lb (92.534 kg)  BMI 31.03 kg/m2  SpO2 100%  LMP 07/31/2015 Physical Exam  Constitutional: She is oriented to person, place, and time. She appears well-developed and well-nourished. No distress.  HENT:  Head: Normocephalic and atraumatic.  Right Ear: Tympanic membrane normal.  Left Ear: Tympanic membrane normal.  Eyes: EOM and lids are normal. Pupils are equal, round, and reactive to light. No foreign body present in the right eye. Left eye exhibits exudate. Left eye exhibits no chemosis. No foreign body present in the left eye. Left conjunctiva is injected.  Neck: Normal range of motion. Neck supple. No tracheal deviation present.  Cardiovascular: Normal rate.   Pulmonary/Chest: Effort normal. No respiratory distress.  Musculoskeletal: Normal range of motion.  Neurological: She is alert and oriented to person, place, and time.  Skin: Skin is warm and dry.  Psychiatric: She has a normal mood and affect. Her behavior is normal.   Nursing note and vitals reviewed.   ED Course  Procedures   DIAGNOSTIC STUDIES: Oxygen Saturation is 100% on RA, normal by my interpretation.    COORDINATION OF CARE: 2:46 PM Will prescribe antibiotic eye drops. Advised to use warm compresses. Advised to follow-up with opthalmologic as needed. Discussed treatment plan with pt at bedside and pt agreed to plan.  Labs Review Labs Reviewed - No data to display  Imaging Review No results found. I have personally reviewed and evaluated these images and lab results as part of my medical decision-making.      MDM   Final diagnoses:  Conjunctivitis of left eye    Focal edema of left upper eyelid with exudate present c/w conjunctivitis.  Dispensed tobramycin.  Pt agrees to warm compresses, OTC lubricating drops.  ophth f/u if needed.  I personally performed the services described in this documentation, which was scribed in my presence. The recorded information has been reviewed and is accurate.    Pauline Aus, PA-C 08/13/15  1336  Raeford Razor, MD 08/26/15 [phone]

## 2015-09-02 ENCOUNTER — Emergency Department (HOSPITAL_COMMUNITY)
Admission: EM | Admit: 2015-09-02 | Discharge: 2015-09-02 | Disposition: A | Discharge status: Home / Self Care | Payer: Medicaid Other | Attending: Emergency Medicine | Admitting: Emergency Medicine

## 2015-09-02 ENCOUNTER — Encounter (HOSPITAL_COMMUNITY): Payer: Self-pay | Admitting: *Deleted

## 2015-09-02 ENCOUNTER — Emergency Department (HOSPITAL_COMMUNITY): Payer: Medicaid Other

## 2015-09-02 DIAGNOSIS — Z79899 Other long term (current) drug therapy: Secondary | ICD-10-CM | POA: Diagnosis not present

## 2015-09-02 DIAGNOSIS — Z87891 Personal history of nicotine dependence: Secondary | ICD-10-CM | POA: Diagnosis not present

## 2015-09-02 DIAGNOSIS — R0981 Nasal congestion: Secondary | ICD-10-CM | POA: Insufficient documentation

## 2015-09-02 DIAGNOSIS — K219 Gastro-esophageal reflux disease without esophagitis: Secondary | ICD-10-CM | POA: Diagnosis not present

## 2015-09-02 DIAGNOSIS — R52 Pain, unspecified: Secondary | ICD-10-CM | POA: Insufficient documentation

## 2015-09-02 DIAGNOSIS — R6889 Other general symptoms and signs: Secondary | ICD-10-CM

## 2015-09-02 DIAGNOSIS — J029 Acute pharyngitis, unspecified: Secondary | ICD-10-CM | POA: Insufficient documentation

## 2015-09-02 DIAGNOSIS — Z8742 Personal history of other diseases of the female genital tract: Secondary | ICD-10-CM | POA: Diagnosis not present

## 2015-09-02 MED ORDER — ONDANSETRON 4 MG PO TBDP: 4.0000 mg | ORAL_TABLET | Freq: Three times a day (TID) | ORAL | Status: DC | PRN

## 2015-09-02 MED ORDER — GUAIFENESIN-CODEINE 100-10 MG/5ML PO SOLN: 5.0000 mL | Freq: Four times a day (QID) | ORAL | Status: DC | PRN

## 2015-09-02 MED ORDER — OSELTAMIVIR PHOSPHATE 75 MG PO CAPS: 75.0000 mg | ORAL_CAPSULE | Freq: Two times a day (BID) | ORAL | Status: DC

## 2015-09-02 MED ORDER — IBUPROFEN 800 MG PO TABS: 800.0000 mg | ORAL_TABLET | Freq: Three times a day (TID) | ORAL | Status: DC | PRN

## 2015-09-02 MED ORDER — IBUPROFEN 800 MG PO TABS
800.0000 mg | ORAL_TABLET | Freq: Once | ORAL | Status: AC
  Administered 2015-09-02: 800 mg via ORAL
  Filled 2015-09-02: qty 1

## 2015-09-02 MED ORDER — FLUTICASONE PROPIONATE 50 MCG/ACT NA SUSP: 2.0000 | Freq: Every day | NASAL | Status: DC

## 2015-09-02 NOTE — Discharge Instructions (Signed)
You may alternate between Tylenol 1000 mg every 6 hours as needed for fever and pain and ibuprofen 800 mg every 8 hours as needed for fever and pain. Please rest and drink plenty of fluids. You may take Tamiflu but note that this medication can cause nausea, vomiting and diarrhea. This medication has been shown to shorten the course of the flu but does not make your symptoms better or make you less contagious. If you decide to fill this medication you must do so today as if you take it any later and will not be effective.   Influenza, Adult Influenza ("the flu") is a viral infection of the respiratory tract. It occurs more often in winter months because people spend more time in close contact with one another. Influenza can make you feel very sick. Influenza easily spreads from person to person (contagious). CAUSES  Influenza is caused by a virus that infects the respiratory tract. You can catch the virus by breathing in droplets from an infected person's cough or sneeze. You can also catch the virus by touching something that was recently contaminated with the virus and then touching your mouth, nose, or eyes. RISKS AND COMPLICATIONS You may be at risk for a more severe case of influenza if you smoke cigarettes, have diabetes, have chronic heart disease (such as heart failure) or lung disease (such as asthma), or if you have a weakened immune system. Elderly people and pregnant women are also at risk for more serious infections. The most common problem of influenza is a lung infection (pneumonia). Sometimes, this problem can require emergency medical care and may be life threatening. SIGNS AND SYMPTOMS  Symptoms typically last 4 to 10 days and may include:  Fever.  Chills.  Headache, body aches, and muscle aches.  Sore throat.  Chest discomfort and cough.  Poor appetite.  Weakness or feeling tired.  Dizziness.  Nausea or vomiting. DIAGNOSIS  Diagnosis of influenza is often made based  on your history and a physical exam. A nose or throat swab test can be done to confirm the diagnosis. TREATMENT  In mild cases, influenza goes away on its own. Treatment is directed at relieving symptoms. For more severe cases, your health care provider may prescribe antiviral medicines to shorten the sickness. Antibiotic medicines are not effective because the infection is caused by a virus, not by bacteria. HOME CARE INSTRUCTIONS  Take medicines only as directed by your health care provider.  Use a cool mist humidifier to make breathing easier.  Get plenty of rest until your temperature returns to normal. This usually takes 3 to 4 days.  Drink enough fluid to keep your urine clear or pale yellow.  Cover yourmouth and nosewhen coughing or sneezing,and wash your handswellto prevent thevirusfrom spreading.  Stay homefromwork orschool untilthe fever is gonefor at least 59full day. PREVENTION  An annual influenza vaccination (flu shot) is the best way to avoid getting influenza. An annual flu shot is now routinely recommended for all adults in the [place] SEEK MEDICAL CARE IF:  You experiencechest pain, yourcough worsens,or you producemore mucus.  Youhave nausea,vomiting, ordiarrhea.  Your fever returns or gets worse. SEEK IMMEDIATE MEDICAL CARE IF:  You havetrouble breathing, you become short of breath,or your skin ornails becomebluish.  You have severe painor stiffnessin the neck.  You develop a sudden headache, or pain in the face or ear.  You have nausea or vomiting that you cannot control. MAKE SURE YOU:   Understand these instructions.  Will watch  your condition.  Will get help right away if you are not doing well or get worse.   This information is not intended to replace advice given to you by your health care provider. Make sure you discuss any questions you have with your health care provider.   Document Released: 07/29/2000 Document Revised:  08/22/2014 Document Reviewed: 10/31/2011 Elsevier Interactive Patient Education [employer]

## 2015-09-02 NOTE — ED Provider Notes (Signed)
TIME SEEN: 5:50 AM  CHIEF COMPLAINT: Sinus pressure, sore throat, cough, ear pain, body aches, fever  HPI: Pt is a 35 y.o. 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 Has also had left ear pain. No vomiting or diarrhea. No known sick contacts or recent travel. Did have an influenza vaccination this year.  ROS: See HPI Constitutional:  fever  Eyes: no drainage  ENT:  runny nose   Cardiovascular:  no chest pain  Resp: no SOB  GI: no vomiting GU: no dysuria Integumentary: no rash  Allergy: no hives  Musculoskeletal: no leg swelling  Neurological: no slurred speech ROS otherwise negative  PAST MEDICAL HISTORY/PAST SURGICAL HISTORY:  Past Medical History  Diagnosis Date  . Abnormal Pap smear   . Endometrial hyperplasia without atypia, simple   . GERD (gastroesophageal reflux disease)     tums prn    MEDICATIONS:  Prior to Admission medications   Medication Sig Start Date End Date Taking? Authorizing Provider  cholecalciferol (VITAMIN D) 1000 UNITS tablet Take 1,000 Units by mouth daily.    Historical Provider, MD  dexamethasone (DECADRON) 4 MG tablet Take 1 tablet (4 mg total) by mouth 2 (two) times daily with a meal. Patient not taking: Reported on 05/11/2015 04/24/15   Ivery Quale, PA-C  diazepam (VALIUM) 5 MG tablet Take 1 tablet (5 mg total) by mouth every 8 (eight) hours as needed for muscle spasms. Patient not taking: Reported on 06/04/2015 05/11/15   Glynn Octave, MD  esomeprazole (NEXIUM) 40 MG capsule Take 40 mg by mouth daily at 12 noon.    Historical Provider, MD  etonogestrel-ethinyl estradiol (NUVARING) 0.12-0.015 MG/24HR vaginal ring Place 1 each vaginally every 21 ( twenty-one) days. Insert one (1) ring vaginally and leave in place for  three (3) weeks, then change. Patient not taking: Reported on 07/03/2015 04/02/15 03/24/16  Rachelle A Denney, CNM  fluconazole (DIFLUCAN) 150 MG tablet Take 1 tablet (150 mg total) by mouth once. Patient not taking: Reported on 07/03/2015 06/11/15   Roe Coombs, CNM  HYDROcodone-acetaminophen (NORCO/VICODIN) 5-325 MG tablet Take 1 tablet by mouth every 6 (six) hours as needed. 07/21/15   Bethann Berkshire, MD  meloxicam (MOBIC) 15 MG tablet Take 1 tablet (15 mg total) by mouth daily. Patient not taking: Reported on 06/04/2015 04/24/15   Ivery Quale, PA-C  methocarbamol (ROBAXIN) 500 MG tablet Take 1 tablet (500 mg total) by mouth 3 (three) times daily. Patient not taking: Reported on 06/04/2015 04/24/15   Ivery Quale, PA-C  Multiple Vitamins-Minerals (MULTIVITAMIN WITH MINERALS) tablet Take 1 tablet by mouth daily.    Historical Provider, MD  Omega-3 Fatty Acids (FISH OIL PO) Take 2 capsules by mouth daily.    Historical Provider, MD  ondansetron (ZOFRAN ODT) 4 MG disintegrating tablet  ODT q4 hours prn nausea/vomit 07/21/15   Bethann Berkshire, MD  pseudoephedrine (SUDAFED) 30 MG tablet Take 1 tablet (30 mg total) by mouth every 6 (six) hours as needed for congestion. Patient not taking: Reported on 04/02/2015 02/28/15   Janne Napoleon, NP  terconazole (TERAZOL 7) 0.4 % vaginal cream Place 1 applicator vaginally at bedtime. Patient not taking: Reported on 07/03/2015 06/11/15   Roe Coombs, CNM  triamcinolone cream (KENALOG) 0.1 % Apply 1 application topically 3 (three) times daily.  Patient not taking: Reported on 07/21/2015 07/03/15   Pauline Aus, PA-C    ALLERGIES:  No Known Allergies  SOCIAL HISTORY:  Social History  Substance Use Topics  . Smoking status: Former Smoker -- 3 years    Types: Cigarettes    Quit date: 09/05/2007  . Smokeless tobacco: Never Used  . Alcohol Use: No     Comment: rare    FAMILY HISTORY: Family History  Problem Relation Age of Onset  . Hypertension  Mother     EXAM: BP 116/74 mmHg  Pulse 94  Temp(Src) 98.5 F (36.9 C) (Oral)  Resp 18  Ht  (1.727 m)  Wt 205 lb (92.987 kg)  BMI 31.18 kg/m2  SpO2 100%  LMP 08/31/2015 CONSTITUTIONAL: Alert and oriented and responds appropriately to questions. Well-appearing; well-nourished, afebrile, nontoxic HEAD: Normocephalic EYES: Conjunctivae clear, PERRL ENT: normal nose; no rhinorrhea; moist mucous membranes; pharynx without lesions noted, TMs are clear bilaterally without erythema, bulging, purulence or perforation; no certain impaction or signs of mastoiditis, no pain with manipulation of the pinna NECK: Supple, no meningismus, no LAD  CARD: RRR; S1 and S2 appreciated; no murmurs, no clicks, no rubs, no gallops RESP: Normal chest excursion without splinting or tachypnea; breath sounds clear and equal bilaterally; no wheezes, no rhonchi, no rales, no hypoxia or respiratory distress, speaking full sentences ABD/GI: Normal bowel sounds; non-distended; soft, non-tender, no rebound, no guarding, no peritoneal signs BACK:  The back appears normal and is non-tender to palpation, there is no CVA tenderness EXT: Normal ROM in all joints; non-tender to palpation; no edema; normal capillary refill; no cyanosis, no calf tenderness or swelling    SKIN: Normal color for age and race; warm, no rash NEURO: Moves all extremities equally, sensation to light touch intact diffusely, cranial nerves II through XII intact PSYCH: The patient's mood and manner are appropriate. Grooming and personal hygiene are appropriate.  MEDICAL DECISION MAKING: Patient here with flulike symptoms. She is hemodynamically stable, afebrile, nontoxic. Lungs are clear and chest x-ray shows no infiltrate. I have recommended supportive care instructions including alternating Tylenol and Motrin, increasing her rest and fluid intake. We'll discharge with Tamiflu prescription that she can take as needed but have discussed there are side  effects with this medication. Discussed with patient if she decides to take this medicine she will need to do so today otherwise she would be outside the treatment window for Tamiflu. Have written her a note for school for the next several days. I recommend using Flonase for nasal congestion, sinus pressure. Will also discharge with prescription for guaifenesin with codeine for cough and pain. Discussed return precautions. She verbalized understanding and is comfortable with this plan.      Ana Maw Shawntae Lowy, DO 09/02/15 [phone]

## 2015-09-02 NOTE — ED Notes (Signed)
Pt reporting cough, sore throat and sinus congestions and pain in left ear for past 2 days.

## 2015-10-15 ENCOUNTER — Emergency Department (HOSPITAL_COMMUNITY): Payer: Medicaid Other

## 2015-10-15 ENCOUNTER — Emergency Department (HOSPITAL_COMMUNITY)
Admission: EM | Admit: 2015-10-15 | Discharge: 2015-10-15 | Disposition: A | Discharge status: Home / Self Care | Payer: Medicaid Other | Attending: Emergency Medicine | Admitting: Emergency Medicine

## 2015-10-15 ENCOUNTER — Encounter (HOSPITAL_COMMUNITY): Payer: Self-pay | Admitting: *Deleted

## 2015-10-15 DIAGNOSIS — Z79899 Other long term (current) drug therapy: Secondary | ICD-10-CM | POA: Diagnosis not present

## 2015-10-15 DIAGNOSIS — Z7951 Long term (current) use of inhaled steroids: Secondary | ICD-10-CM | POA: Diagnosis not present

## 2015-10-15 DIAGNOSIS — M5431 Sciatica, right side: Secondary | ICD-10-CM | POA: Diagnosis not present

## 2015-10-15 DIAGNOSIS — Z87891 Personal history of nicotine dependence: Secondary | ICD-10-CM | POA: Diagnosis not present

## 2015-10-15 DIAGNOSIS — K219 Gastro-esophageal reflux disease without esophagitis: Secondary | ICD-10-CM | POA: Diagnosis not present

## 2015-10-15 DIAGNOSIS — M25551 Pain in right hip: Secondary | ICD-10-CM

## 2015-10-15 DIAGNOSIS — Z8742 Personal history of other diseases of the female genital tract: Secondary | ICD-10-CM | POA: Insufficient documentation

## 2015-10-15 MED ORDER — HYDROCODONE-ACETAMINOPHEN 5-325 MG PO TABS: ORAL_TABLET | ORAL | Status: DC

## 2015-10-15 MED ORDER — PREDNISONE 10 MG PO TABS: ORAL_TABLET | ORAL | Status: DC

## 2015-10-15 MED ORDER — HYDROCODONE-ACETAMINOPHEN 5-325 MG PO TABS
1.0000 | ORAL_TABLET | Freq: Once | ORAL | Status: AC
  Administered 2015-10-15: 1 via ORAL
  Filled 2015-10-15: qty 1

## 2015-10-15 MED ORDER — NAPROXEN 250 MG PO TABS
500.0000 mg | ORAL_TABLET | Freq: Once | ORAL | Status: AC
  Administered 2015-10-15: 500 mg via ORAL
  Filled 2015-10-15: qty 2

## 2015-10-15 MED ORDER — NAPROXEN 500 MG PO TABS: 500.0000 mg | ORAL_TABLET | Freq: Two times a day (BID) | ORAL | Status: DC

## 2015-10-15 NOTE — ED Notes (Signed)
Tammy PA at bedside 

## 2015-10-15 NOTE — ED Notes (Signed)
Right hip pain, radiating to buttock and upper leg. Pt states snapping and clicking. Hx of sciatica. Unable to get in to see PCP this morning.

## 2015-10-15 NOTE — Discharge Instructions (Signed)

## 2015-10-15 NOTE — ED Provider Notes (Signed)
CSN: [id number]     Arrival date & time 10/15/15  4098 History   First MD Initiated Contact with Patient 10/15/15 [phone]     Chief Complaint  Patient presents with  . Hip Pain     (Consider location/radiation/quality/duration/timing/severity/associated sxs/prior Treatment) HPI   Ana Pace is a 35 y.o. female who presents to the Emergency Department complaining of right hip pain for several days. She describes a "popping and clicking" to the right hip with movement. She reports a history of frequent sciatica, and pain is also radiating into the right thigh. She denies recent injury, abdominal pain, dysuria or pelvic pain, numbness or weakness to the lower extremities.. Symptoms resolve at rest.  She has taken ibuprofen without relief.   Past Medical History  Diagnosis Date  . Abnormal Pap smear   . Endometrial hyperplasia without atypia, simple   . GERD (gastroesophageal reflux disease)     tums prn   Past Surgical History  Procedure Laterality Date  . Cesarean section      x3  . Diagnostic laparoscopy      endometriosis  . Cesarean section  04/02/2012    Procedure: CESAREAN SECTION;  Surgeon: Antionette Char, MD;  Location: [facility];  Service: Obstetrics;  Laterality: N/A;  Repeat cesarean section with delivery of baby girl at 72.  Marland Kitchen Cholecystectomy     Family History  Problem Relation Age of Onset  . Hypertension Mother    Social History  Substance Use Topics  . Smoking status: Former Smoker -- 3 years    Types: Cigarettes    Quit date: 09/05/2007  . Smokeless tobacco: Never Used  . Alcohol Use: No     Comment: rare   OB History    Gravida Para Term Preterm AB TAB SAB Ectopic Multiple Living   Review of Systems  Constitutional: Negative for fever and chills.  Gastrointestinal: Negative for nausea, vomiting and abdominal pain.  Genitourinary: Negative for dysuria and difficulty urinating.  Musculoskeletal: Positive for arthralgias (right  hip pain). Negative for joint swelling.  Skin: Negative for color change, rash and wound.  Neurological: Negative for weakness and numbness.  All other systems reviewed and are negative.     Allergies  Review of patient's allergies indicates no known allergies.  Home Medications   Prior to Admission medications   Medication Sig Start Date End Date Taking? Authorizing Provider  cholecalciferol (VITAMIN D) 1000 UNITS tablet Take 1,000 Units by mouth daily.    Historical Provider, MD  esomeprazole (NEXIUM) 40 MG capsule Take 40 mg by mouth daily at 12 noon.    Historical Provider, MD  fluticasone (FLONASE) 50 MCG/ACT nasal spray Place 2 sprays into both nostrils daily. 09/02/15   Kristen N Ward, DO  guaiFENesin-codeine 100-10 MG/5ML syrup Take 5-10 mLs by mouth every 6 (six) hours as needed for cough. 09/02/15   Kristen N Ward, DO  ibuprofen (ADVIL,MOTRIN) 800 MG tablet Take 1 tablet (800 mg total) by mouth every 8 (eight) hours as needed for mild pain. 09/02/15   Kristen N Ward, DO  Multiple Vitamins-Minerals (MULTIVITAMIN WITH MINERALS) tablet Take 1 tablet by mouth daily.    Historical Provider, MD  Omega-3 Fatty Acids (FISH OIL PO) Take 2 capsules by mouth daily.    Historical Provider, MD  ondansetron (ZOFRAN ODT) 4 MG disintegrating tablet Take 1 tablet (4 mg total) by mouth every 8 (eight) hours as needed  for nausea or vomiting. 09/02/15   Kristen N Ward, DO  oseltamivir (TAMIFLU) 75 MG capsule Take 1 capsule (75 mg total) by mouth every 12 (twelve) hours. 09/02/15   Kristen N Ward, DO   BP 116/68 mmHg  Pulse 74  Temp(Src) 97.9 F (36.6 C) (Oral)  Resp 18  Ht  (1.727 m)  Wt 95.255 kg  BMI 31.94 kg/m2  SpO2 100%  LMP 10/08/2015 Physical Exam  Constitutional: She is oriented to person, place, and time. She appears well-developed and well-nourished. No distress.  Cardiovascular: Normal rate, regular rhythm and intact distal pulses.   Pulmonary/Chest: Effort normal and breath  sounds normal. No respiratory distress.  Abdominal: Soft. She exhibits no distension. There is no tenderness. There is no rebound and no guarding.  Musculoskeletal: Normal range of motion.  Tenderness to the lateral right hip and SI joint with range of motion. Pain reproduced with external rotation of the right hip. Mild crepitus present. No erythema or excessive warmth or edema. Distal sensation intact, DP pulses are brisk and symmetrical  Neurological: She is alert and oriented to person, place, and time. She exhibits normal muscle tone. Coordination normal.  Skin: Skin is warm. No rash noted.  Psychiatric: She has a normal mood and affect.  Nursing note and vitals reviewed.   ED Course  Procedures (including critical care time) Labs Review Labs Reviewed - No data to display  Imaging Review Dg Hip Unilat With Pelvis 2-3 Views Right  10/15/2015  CLINICAL DATA:  RIGHT lateral hip pain and clicking since last night, no known injury, initial encounter EXAM: DG HIP (WITH OR WITHOUT PELVIS) 2-3V RIGHT COMPARISON:  None FINDINGS: SI joints symmetric and preserved. Hip joints symmetric and preserved. Osseous mineralization normal. No acute fracture, dislocation, or bone destruction. IMPRESSION: Normal exam. Electronically Signed   By: Ulyses Southward M.D.   On: 10/15/2015 10:41   I have personally reviewed and evaluated these images and lab results as part of my medical decision-making.   EKG Interpretation None      MDM   Final diagnoses:  Hip pain, acute, right  Sciatica of right side   Patient is well-appearing. Vital signs are stable. Ambulates with a steady gait. No focal neuro deficits on exam. X-ray is reassuring. Symptoms likely musculoskeletal but also possible sciatica flare. Do not suspect a septic process. Patient agrees to symptomatic treatment and close follow-up with her primary care physician and orthopedic referral was given.  Appears stable for d/c     Pauline Aus,  PA-C 10/15/15 1220  Margarita Grizzle, MD 10/15/15 1538

## 2015-11-18 ENCOUNTER — Encounter (HOSPITAL_COMMUNITY): Payer: Self-pay | Admitting: Emergency Medicine

## 2015-11-18 ENCOUNTER — Emergency Department (HOSPITAL_COMMUNITY)
Admission: EM | Admit: 2015-11-18 | Discharge: 2015-11-18 | Disposition: A | Discharge status: Home / Self Care | Payer: Medicaid Other | Attending: Emergency Medicine | Admitting: Emergency Medicine

## 2015-11-18 DIAGNOSIS — Z792 Long term (current) use of antibiotics: Secondary | ICD-10-CM | POA: Insufficient documentation

## 2015-11-18 DIAGNOSIS — Z87891 Personal history of nicotine dependence: Secondary | ICD-10-CM | POA: Insufficient documentation

## 2015-11-18 DIAGNOSIS — J069 Acute upper respiratory infection, unspecified: Secondary | ICD-10-CM | POA: Insufficient documentation

## 2015-11-18 DIAGNOSIS — R05 Cough: Secondary | ICD-10-CM | POA: Diagnosis present

## 2015-11-18 MED ORDER — FEXOFENADINE-PSEUDOEPHED ER 60-120 MG PO TB12: 1.0000 | ORAL_TABLET | Freq: Two times a day (BID) | ORAL | Status: DC

## 2015-11-18 MED ORDER — HYDROCODONE-HOMATROPINE 5-1.5 MG/5ML PO SYRP: 5.0000 mL | ORAL_SOLUTION | Freq: Four times a day (QID) | ORAL | Status: DC | PRN

## 2015-11-18 NOTE — Discharge Instructions (Signed)
Please wash hands frequently. Please increase fluids. Use Tylenol every 4 hours, or ibuprofen every 6 hours for aching and fever. Use Hycodan every 6 hours for cough. This medication may cause drowsiness, please use with caution. Use Allegra-D every 12 hours for congestion and cough. Upper Respiratory Infection, Adult Most upper respiratory infections (URIs) are a viral infection of the air passages leading to the lungs. A URI affects the nose, throat, and upper air passages. The most common type of URI is nasopharyngitis and is typically referred to as "the common cold." URIs run their course and usually go away on their own. Most of the time, a URI does not require medical attention, but sometimes a bacterial infection in the upper airways can follow a viral infection. This is called a secondary infection. Sinus and middle ear infections are common types of secondary upper respiratory infections. Bacterial pneumonia can also complicate a URI. A URI can worsen asthma and chronic obstructive pulmonary disease (COPD). Sometimes, these complications can require emergency medical care and may be life threatening.  CAUSES Almost all URIs are caused by viruses. A virus is a type of germ and can spread from one person to another.  RISKS FACTORS You may be at risk for a URI if:   You smoke.   You have chronic heart or lung disease.  You have a weakened defense (immune) system.   You are very young or very old.   You have nasal allergies or asthma.  You work in crowded or poorly ventilated areas.  You work in health care facilities or schools. SIGNS AND SYMPTOMS  Symptoms typically develop 2-3 days after you come in contact with a cold virus. Most viral URIs last 7-10 days. However, viral URIs from the influenza virus (flu virus) can last 14-18 days and are typically more severe. Symptoms may include:   Runny or stuffy (congested) nose.   Sneezing.   Cough.   Sore throat.   Headache.    Fatigue.   Fever.   Loss of appetite.   Pain in your forehead, behind your eyes, and over your cheekbones (sinus pain).  Muscle aches.  DIAGNOSIS  Your health care provider may diagnose a URI by:  Physical exam.  Tests to check that your symptoms are not due to another condition such as:  Strep throat.  Sinusitis.  Pneumonia.  Asthma. TREATMENT  A URI goes away on its own with time. It cannot be cured with medicines, but medicines may be prescribed or recommended to relieve symptoms. Medicines may help:  Reduce your fever.  Reduce your cough.  Relieve nasal congestion. HOME CARE INSTRUCTIONS   Take medicines only as directed by your health care provider.   Gargle warm saltwater or take cough drops to comfort your throat as directed by your health care provider.  Use a warm mist humidifier or inhale steam from a shower to increase air moisture. This may make it easier to breathe.  Drink enough fluid to keep your urine clear or pale yellow.   Eat soups and other clear broths and maintain good nutrition.   Rest as needed.   Return to work when your temperature has returned to normal or as your health care provider advises. You may need to stay home longer to avoid infecting others. You can also use a face mask and careful hand washing to prevent spread of the virus.  Increase the usage of your inhaler if you have asthma.   Do not use any tobacco  products, including cigarettes, chewing tobacco, or electronic cigarettes. If you need help quitting, ask your health care provider. PREVENTION  The best way to protect yourself from getting a cold is to practice good hygiene.   Avoid oral or hand contact with people with cold symptoms.   Wash your hands often if contact occurs.  There is no clear evidence that vitamin C, vitamin E, echinacea, or exercise reduces the chance of developing a cold. However, it is always recommended to get plenty of rest,  exercise, and practice good nutrition.  SEEK MEDICAL CARE IF:   You are getting worse rather than better.   Your symptoms are not controlled by medicine.   You have chills.  You have worsening shortness of breath.  You have brown or red mucus.  You have yellow or brown nasal discharge.  You have pain in your face, especially when you bend forward.  You have a fever.  You have swollen neck glands.  You have pain while swallowing.  You have white areas in the back of your throat. SEEK IMMEDIATE MEDICAL CARE IF:   You have severe or persistent:  Headache.  Ear pain.  Sinus pain.  Chest pain.  You have chronic lung disease and any of the following:  Wheezing.  Prolonged cough.  Coughing up blood.  A change in your usual mucus.  You have a stiff neck.  You have changes in your:  Vision.  Hearing.  Thinking.  Mood. MAKE SURE YOU:   Understand these instructions.  Will watch your condition.  Will get help right away if you are not doing well or get worse.   This information is not intended to replace advice given to you by your health care provider. Make sure you discuss any questions you have with your health care provider.   Document Released: 01/25/2001 Document Revised: 12/16/2014 Document Reviewed: 11/06/2013 Elsevier Interactive Patient Education [employer].

## 2015-11-18 NOTE — ED Notes (Signed)
Cough and congestion, with sob, green sputum, headache

## 2015-11-18 NOTE — ED Provider Notes (Signed)
CSN: [id number]     Arrival date & time 11/18/15  1743 History   First MD Initiated Contact with Patient 11/18/15 1843     Chief Complaint  Patient presents with  . Cough     (Consider location/radiation/quality/duration/timing/severity/associated sxs/prior Treatment) Patient is a 35 y.o. female presenting with cough. The history is provided by the patient.  Cough Cough characteristics:  Productive Sputum characteristics:  Green Severity:  Moderate Onset quality:  Gradual Timing:  Intermittent Progression:  Worsening Chronicity:  New Smoker: no   Context: sick contacts and weather changes   Relieved by:  Nothing Worsened by:  Lying down Ineffective treatments:  Rest Associated symptoms: chest pain, chills, fever and rhinorrhea   Associated symptoms: no rash and no wheezing   Risk factors: no recent travel     Past Medical History  Diagnosis Date  . Abnormal Pap smear   . Endometrial hyperplasia without atypia, simple   . GERD (gastroesophageal reflux disease)     tums prn   Past Surgical History  Procedure Laterality Date  . Cesarean section      x3  . Diagnostic laparoscopy      endometriosis  . Cesarean section  04/02/2012    Procedure: CESAREAN SECTION;  Surgeon: Antionette CharLisa Jackson-Moore, MD;  Location: [facility];  Service: Obstetrics;  Laterality: N/A;  Repeat cesarean section with delivery of baby girl at 11352.  Marland Kitchen. Cholecystectomy     Family History  Problem Relation Age of Onset  . Hypertension Mother    Social History  Substance Use Topics  . Smoking status: Former Smoker -- 3 years    Types: Cigarettes    Quit date: 09/05/2007  . Smokeless tobacco: Never Used  . Alcohol Use: No     Comment: rare   OB History    Gravida Para Term Preterm AB TAB SAB Ectopic Multiple Living   3 3 1       3      Review of Systems  Constitutional: Positive for fever and chills.  HENT: Positive for congestion and rhinorrhea.   Respiratory: Positive for cough. Negative for  wheezing.   Cardiovascular: Positive for chest pain.  Skin: Negative for rash.  All other systems reviewed and are negative.     Allergies  Review of patient's allergies indicates no known allergies.  Home Medications   Prior to Admission medications   Medication Sig Start Date End Date Taking? Authorizing Provider  amoxicillin-clavulanate (AUGMENTIN) 875-125 MG tablet Take 1 tablet by mouth 2 (two) times daily with a meal. For 7 days(started 11/16/15) 11/16/15  Yes Historical Provider, MD  fluticasone (FLONASE) 50 MCG/ACT nasal spray Place 2 sprays into both nostrils daily. Patient not taking: Reported on 10/15/2015 09/02/15   Kristen N Ward, DO  guaiFENesin-codeine 100-10 MG/5ML syrup Take 5-10 mLs by mouth every 6 (six) hours as needed for cough. Patient not taking: Reported on 10/15/2015 09/02/15   Layla MawKristen N Ward, DO  HYDROcodone-acetaminophen (NORCO/VICODIN) 5-325 MG tablet Take one tab po q 4-6 hrs prn pain Patient not taking: Reported on 11/18/2015 10/15/15   Tammy Triplett, PA-C  ibuprofen (ADVIL,MOTRIN) 800 MG tablet Take 1 tablet (800 mg total) by mouth every 8 (eight) hours as needed for mild pain. 09/02/15   Kristen N Ward, DO  Multiple Vitamins-Minerals (MULTIVITAMIN WITH MINERALS) tablet Take 1 tablet by mouth daily.    Historical Provider, MD  naproxen (NAPROSYN) 500 MG tablet Take 1 tablet (500 mg total) by mouth 2 (two) times daily with a  meal. 10/15/15   Tammy Triplett, PA-C  Omega-3 Fatty Acids (FISH OIL PO) Take 2 capsules by mouth daily.    Historical Provider, MD  ondansetron (ZOFRAN ODT) 4 MG disintegrating tablet Take 1 tablet (4 mg total) by mouth every 8 (eight) hours as needed for nausea or vomiting. Patient not taking: Reported on 10/15/2015 09/02/15   Layla Maw Ward, DO  oseltamivir (TAMIFLU) 75 MG capsule Take 1 capsule (75 mg total) by mouth every 12 (twelve) hours. Patient not taking: Reported on 10/15/2015 09/02/15   Layla Maw Ward, DO  predniSONE (DELTASONE) 10 MG tablet Take  6 tablets day one, 5 tablets day two, 4 tablets day three, 3 tablets day four, 2 tablets day five, then 1 tablet day six Patient not taking: Reported on 11/18/2015 10/15/15   Tammy Triplett, PA-C   BP 118/79 mmHg  Pulse 90  Temp(Src) 98.1 F (36.7 C) (Oral)  Resp 22  Ht  (1.727 m)  Wt 95.255 kg  BMI 31.94 kg/m2  SpO2 98%  LMP 11/18/2015 Physical Exam  Constitutional: She is oriented to person, place, and time. She appears well-developed and well-nourished.  Non-toxic appearance.  HENT:  Head: Normocephalic.  Right Ear: Tympanic membrane and external ear normal.  Left Ear: Tympanic membrane and external ear normal.  Nasal congestion present. No pain to percussion over the sinuses. No hot areas over the sinuses.  The uvula is enlarged, but midline. Speech is clear and understandable. The airway is patent.  Eyes: EOM and lids are normal. Pupils are equal, round, and reactive to light.  Neck: Normal range of motion. Neck supple. Carotid bruit is not present.  Cardiovascular: Normal rate, regular rhythm, normal heart sounds, intact distal pulses and normal pulses.   Pulmonary/Chest: Breath sounds normal. No respiratory distress.  Abdominal: Soft. Bowel sounds are normal. There is no tenderness. There is no guarding.  Musculoskeletal: Normal range of motion.  Lymphadenopathy:       Head (right side): No submandibular adenopathy present.       Head (left side): No submandibular adenopathy present.    She has no cervical adenopathy.  Neurological: She is alert and oriented to person, place, and time. She has normal strength. No cranial nerve deficit or sensory deficit.  Skin: Skin is warm and dry. No rash noted.  Psychiatric: She has a normal mood and affect. Her speech is normal.  Nursing note and vitals reviewed.   ED Course  Procedures (including critical care time) Labs Review Labs Reviewed - No data to display  Imaging Review No results found. I have personally reviewed and  evaluated these images and lab results as part of my medical decision-making.   EKG Interpretation None      MDM  Vital signs are well within normal limits. The examination favors an upper respiratory infection. The patient will use Allegra-D 2 times daily for congestion and cough. Patient will use Tylenol and/or ibuprofen for fever and congestion. Patient will use Hycodan for cough. Encouraged patient to rest. We also discussed increasing fluids, wash hands, and using a mask until symptoms have resolved.    Final diagnoses:  None    *I have reviewed nursing notes, vital signs, and all appropriate lab and imaging results for this patient.[id number] PA-C 11/18/15 1926  Lavera Guise, MD 11/19/15 [phone]

## 2015-11-18 NOTE — ED Notes (Signed)
Pt saw doctor on Monday and got a steroid shot

## 2015-12-18 ENCOUNTER — Encounter (HOSPITAL_COMMUNITY): Payer: Self-pay

## 2015-12-18 ENCOUNTER — Emergency Department (HOSPITAL_COMMUNITY)
Admission: EM | Admit: 2015-12-18 | Discharge: 2015-12-18 | Disposition: A | Discharge status: Home / Self Care | Payer: Medicaid Other | Attending: Emergency Medicine | Admitting: Emergency Medicine

## 2015-12-18 DIAGNOSIS — Z7951 Long term (current) use of inhaled steroids: Secondary | ICD-10-CM | POA: Insufficient documentation

## 2015-12-18 DIAGNOSIS — K219 Gastro-esophageal reflux disease without esophagitis: Secondary | ICD-10-CM | POA: Diagnosis not present

## 2015-12-18 DIAGNOSIS — M5441 Lumbago with sciatica, right side: Secondary | ICD-10-CM

## 2015-12-18 DIAGNOSIS — Z792 Long term (current) use of antibiotics: Secondary | ICD-10-CM | POA: Insufficient documentation

## 2015-12-18 DIAGNOSIS — M5431 Sciatica, right side: Secondary | ICD-10-CM | POA: Diagnosis present

## 2015-12-18 DIAGNOSIS — Z791 Long term (current) use of non-steroidal anti-inflammatories (NSAID): Secondary | ICD-10-CM | POA: Diagnosis not present

## 2015-12-18 DIAGNOSIS — Z79891 Long term (current) use of opiate analgesic: Secondary | ICD-10-CM | POA: Diagnosis not present

## 2015-12-18 DIAGNOSIS — Z87891 Personal history of nicotine dependence: Secondary | ICD-10-CM | POA: Diagnosis not present

## 2015-12-18 DIAGNOSIS — Z7952 Long term (current) use of systemic steroids: Secondary | ICD-10-CM | POA: Diagnosis not present

## 2015-12-18 MED ORDER — KETOROLAC TROMETHAMINE 60 MG/2ML IM SOLN
60.0000 mg | Freq: Once | INTRAMUSCULAR | Status: AC
  Administered 2015-12-18: 60 mg via INTRAMUSCULAR
  Filled 2015-12-18: qty 2

## 2015-12-18 MED ORDER — DEXAMETHASONE SODIUM PHOSPHATE 10 MG/ML IJ SOLN
10.0000 mg | Freq: Once | INTRAMUSCULAR | Status: AC
  Administered 2015-12-18: 10 mg via INTRAMUSCULAR
  Filled 2015-12-18: qty 1

## 2015-12-18 MED ORDER — CYCLOBENZAPRINE HCL 10 MG PO TABS: 10.0000 mg | ORAL_TABLET | Freq: Two times a day (BID) | ORAL | Status: DC | PRN

## 2015-12-18 NOTE — ED Provider Notes (Signed)
CSN: [id number]     Arrival date & time 12/18/15  1229 History  By signing my name below, I, Marisue Humble, attest that this documentation has been prepared under the direction and in the presence of non-physician practitioner, Gaylyn Rong PA-C. Electronically Signed: Marisue Humble, Scribe. 12/18/2015. 1:16 PM.   Chief Complaint  Patient presents with  . Sciatica    Right Leg   The history is provided by the patient. No language interpreter was used.   HPI Comments:  CASSUNDRA MCKEEVER is a 35 y.o. 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 Pt reports doing crossfit and lifting heavy weights last week and notes symptoms have worsened since then. Sitting and bending at the right hip exacerbates the pain. Pt reports associated numbness and tingling in right leg. Pt states current symptoms feel similar to past sciatica flare-ups. She has taken 800mg  of Ibuprofen every 4-6 hours and .5 Valium last night without relief. Pt has been treated for sciatica pain in the past with Toradol shot, Decadron, Valium, and heat therapy. Denies incontinence.  Past Medical History  Diagnosis Date  . Abnormal Pap smear   . Endometrial hyperplasia without atypia, simple   . GERD (gastroesophageal reflux disease)     tums prn   Past Surgical History  Procedure Laterality Date  . Cesarean section      x3  . Diagnostic laparoscopy      endometriosis  . Cesarean section  04/02/2012    Procedure: CESAREAN SECTION;  Surgeon: Antionette Char, MD;  Location: [facility];  Service: Obstetrics;  Laterality: N/A;  Repeat cesarean section with delivery of baby girl at 19.  Marland Kitchen Cholecystectomy     Family History  Problem Relation Age of Onset  . Hypertension Mother    Social History  Substance Use Topics  . Smoking status: Former Smoker -- 3 years    Types: Cigarettes    Quit date:  09/05/2007  . Smokeless tobacco: Never Used  . Alcohol Use: No     Comment: rare   OB History    Gravida Para Term Preterm AB TAB SAB Ectopic Multiple Living   3 3 1       3      Review of Systems  Musculoskeletal: Positive for arthralgias.  Neurological: Positive for numbness.  All other systems reviewed and are negative.  Allergies  Morphine and related  Home Medications   Prior to Admission medications   Medication Sig Start Date End Date Taking? Authorizing Provider  amoxicillin-clavulanate (AUGMENTIN) 875-125 MG tablet Take 1 tablet by mouth 2 (two) times daily with a meal. For 7 days(started 11/16/15) 11/16/15   Historical Provider, MD  fexofenadine-pseudoephedrine (ALLEGRA-D) 60-120 MG 12 hr tablet Take 1 tablet by mouth every 12 (twelve) hours. 11/18/15   Ivery Quale, PA-C  fluticasone (FLONASE) 50 MCG/ACT nasal spray Place 2 sprays into both nostrils daily. Patient not taking: Reported on 10/15/2015 09/02/15   Kristen N Ward, DO  guaiFENesin-codeine 100-10 MG/5ML syrup Take 5-10 mLs by mouth every 6 (six) hours as needed for cough. Patient not taking: Reported on 10/15/2015 09/02/15   Layla Maw Ward, DO  HYDROcodone-acetaminophen (NORCO/VICODIN) 5-325 MG tablet Take one tab po q 4-6 hrs prn pain Patient not taking: Reported on 11/18/2015 10/15/15   Tammy Triplett, PA-C  HYDROcodone-homatropine (HYCODAN) 5-1.5 MG/5ML syrup Take 5 mLs by mouth every 6 (six)  hours as needed. 11/18/15   Ivery Quale, PA-C  ibuprofen (ADVIL,MOTRIN) 800 MG tablet Take 1 tablet (800 mg total) by mouth every 8 (eight) hours as needed for mild pain. 09/02/15   Kristen N Ward, DO  Multiple Vitamins-Minerals (MULTIVITAMIN WITH MINERALS) tablet Take 1 tablet by mouth daily.    Historical Provider, MD  naproxen (NAPROSYN) 500 MG tablet Take 1 tablet (500 mg total) by mouth 2 (two) times daily with a meal. 10/15/15   Tammy Triplett, PA-C  Omega-3 Fatty Acids (FISH OIL PO) Take 2 capsules by mouth daily.    Historical  Provider, MD  ondansetron (ZOFRAN ODT) 4 MG disintegrating tablet Take 1 tablet (4 mg total) by mouth every 8 (eight) hours as needed for nausea or vomiting. Patient not taking: Reported on 10/15/2015 09/02/15   Layla Maw Ward, DO  oseltamivir (TAMIFLU) 75 MG capsule Take 1 capsule (75 mg total) by mouth every 12 (twelve) hours. Patient not taking: Reported on 10/15/2015 09/02/15   Layla Maw Ward, DO  predniSONE (DELTASONE) 10 MG tablet Take 6 tablets day one, 5 tablets day two, 4 tablets day three, 3 tablets day four, 2 tablets day five, then 1 tablet day six Patient not taking: Reported on 11/18/2015 10/15/15   Tammy Triplett, PA-C   BP 112/79 mmHg  Pulse 74  Temp(Src) 98.1 F (36.7 C) (Oral)  Resp 16  Ht  (1.727 m)  Wt 202 lb (91.627 kg)  BMI 30.72 kg/m2  SpO2 100%  LMP 12/09/2015 (Approximate) Physical Exam  Constitutional: She is oriented to person, place, and time. She appears well-developed and well-nourished. No distress.  HENT:  Head: Normocephalic and atraumatic.  Eyes: Conjunctivae are normal. Right eye exhibits no discharge. Left eye exhibits no discharge. No scleral icterus.  Cardiovascular: Normal rate.   Pulmonary/Chest: Effort normal.  Musculoskeletal:  No midline spinal tenderness; right lumbar paraspinal muscle TTP; full ROM of C, T and L spine; positive R SLR; no stepoffs or obvious deformities   Neurological: She is alert and oriented to person, place, and time. Coordination normal.  Strength 5/5 throughout. No sensory deficits. No gait abnormality   Skin: Skin is warm and dry. No rash noted. She is not diaphoretic. No erythema. No pallor.  Psychiatric: She has a normal mood and affect. Her behavior is normal.  Nursing note and vitals reviewed.   ED Course  Procedures  DIAGNOSTIC STUDIES:  Oxygen Saturation is 100% on RA, normal by my interpretation.    COORDINATION OF CARE:  1:09 PM Will administer Toradol and Decadron. Discussed treatment plan with pt at  bedside and pt agreed to plan.  Labs Review Labs Reviewed - No data to display  Imaging Review No results found. I have personally reviewed and evaluated these images and lab results as part of my medical decision-making.   EKG Interpretation None      MDM   Final diagnoses:  Right-sided low back pain with right-sided sciatica   Patient with back pain, likely flare of recurrent sciatica. Pt states this feels similar to her previous back pain.  No neurological deficits and normal neuro exam.  Patient is ambulatory.  No loss of bowel or bladder control.  No concern for cauda equina.  No fever, night sweats, weight loss, h/o cancer, IVDA, no recent procedure to back. No urinary symptoms suggestive of UTI.  Pt given IM toradol and decadron. Will d/c home with flexeril. Supportive care and return precaution discussed. Appears safe for discharge at this time.  Follow up as indicated in discharge paperwork.     I personally performed the services described in this documentation, which was scribed in my presence. The recorded information has been reviewed and is accurate.     Lester KinsmanSamantha Tripp [place], PA-C 12/18/15 1322  Raeford RazorStephen Kohut, MD 12/24/15 [id number]

## 2015-12-18 NOTE — Discharge Instructions (Signed)
Sciatica °Sciatica is pain, weakness, numbness, or tingling along the path of the sciatic nerve. The nerve starts in the lower back and runs down the back of each leg. The nerve controls the muscles in the lower leg and in the back of the knee, while also providing sensation to the back of the thigh, lower leg, and the sole of your foot. Sciatica is a symptom of another medical condition. For instance, nerve damage or certain conditions, such as a herniated disk or bone spur on the spine, pinch or put pressure on the sciatic nerve. This causes the pain, weakness, or other sensations normally associated with sciatica. Generally, sciatica only affects one side of the body. °CAUSES  °· Herniated or slipped disc. °· Degenerative disk disease. °· A pain disorder involving the narrow muscle in the buttocks (piriformis syndrome). °· Pelvic injury or fracture. °· Pregnancy. °· Tumor (rare). °SYMPTOMS  °Symptoms can vary from mild to very severe. The symptoms usually travel from the low back to the buttocks and down the back of the leg. Symptoms can include: °· Mild tingling or dull aches in the lower back, leg, or hip. °· Numbness in the back of the calf or sole of the foot. °· Burning sensations in the lower back, leg, or hip. °· Sharp pains in the lower back, leg, or hip. °· Leg weakness. °· Severe back pain inhibiting movement. °These symptoms may get worse with coughing, sneezing, laughing, or prolonged sitting or standing. Also, being overweight may worsen symptoms. °DIAGNOSIS  °Your caregiver will perform a physical exam to look for common symptoms of sciatica. He or she may ask you to do certain movements or activities that would trigger sciatic nerve pain. Other tests may be performed to find the cause of the sciatica. These may include: °· Blood tests. °· X-rays. °· Imaging tests, such as an MRI or CT scan. °TREATMENT  °Treatment is directed at the cause of the sciatic pain. Sometimes, treatment is not necessary  and the pain and discomfort goes away on its own. If treatment is needed, your caregiver may suggest: °· Over-the-counter medicines to relieve pain. °· Prescription medicines, such as anti-inflammatory medicine, muscle relaxants, or narcotics. °· Applying heat or ice to the painful area. °· Steroid injections to lessen pain, irritation, and inflammation around the nerve. °· Reducing activity during periods of pain. °· Exercising and stretching to strengthen your abdomen and improve flexibility of your spine. Your caregiver may suggest losing weight if the extra weight makes the back pain worse. °· Physical therapy. °· Surgery to eliminate what is pressing or pinching the nerve, such as a bone spur or part of a herniated disk. °HOME CARE INSTRUCTIONS  °· Only take over-the-counter or prescription medicines for pain or discomfort as directed by your caregiver. °· Apply ice to the affected area for 20 minutes, 3-4 times a day for the first 48-72 hours. Then try heat in the same way. °· Exercise, stretch, or perform your usual activities if these do not aggravate your pain. °· Attend physical therapy sessions as directed by your caregiver. °· Keep all follow-up appointments as directed by your caregiver. °· Do not wear high heels or shoes that do not provide proper support. °· Check your mattress to see if it is too soft. A firm mattress may lessen your pain and discomfort. °SEEK IMMEDIATE MEDICAL CARE IF:  °· You lose control of your bowel or bladder (incontinence). °· You have increasing weakness in the lower back, pelvis, buttocks,   or legs.  You have redness or swelling of your back.  You have a burning sensation when you urinate.  You have pain that gets worse when you lie down or awakens you at night.  Your pain is worse than you have experienced in the past.  Your pain is lasting longer than 4 weeks.  You are suddenly losing weight without reason. MAKE SURE YOU:  Understand these  instructions.  Will watch your condition.  Will get help right away if you are not doing well or get worse.   This information is not intended to replace advice given to you by your health care provider. Make sure you discuss any questions you have with your health care provider.   Take Flexeril as needed for muscle relaxation. Apply ice to affected area. Follow-up with her primary care doctor for reevaluation. Return to the ED if you experience severe worsening of your symptoms, increased back pain, numbness or tingling in both of her lower extremities, loss of control your bowels or bladder, fever.

## 2015-12-18 NOTE — ED Notes (Signed)
Pt c/o right leg pain r/t sciatic dx. Pt reports heay last week. Pt reports taking x1 5mg  Valium last night and 800mg  Ibupropfen q4-6hrs for the past 3 days w/ no relief. Pt A+OX4, speaking in complete, ambulatory w/ limp.

## 2016-01-19 ENCOUNTER — Emergency Department (HOSPITAL_COMMUNITY)
Admission: EM | Admit: 2016-01-19 | Discharge: 2016-01-19 | Disposition: A | Discharge status: Home / Self Care | Payer: Medicaid Other | Attending: Emergency Medicine | Admitting: Emergency Medicine

## 2016-01-19 ENCOUNTER — Encounter (HOSPITAL_COMMUNITY): Payer: Self-pay | Admitting: Emergency Medicine

## 2016-01-19 DIAGNOSIS — Z87891 Personal history of nicotine dependence: Secondary | ICD-10-CM | POA: Diagnosis not present

## 2016-01-19 DIAGNOSIS — M545 Low back pain: Secondary | ICD-10-CM | POA: Diagnosis present

## 2016-01-19 DIAGNOSIS — M5441 Lumbago with sciatica, right side: Secondary | ICD-10-CM | POA: Diagnosis not present

## 2016-01-19 MED ORDER — HYDROCODONE-ACETAMINOPHEN 5-325 MG PO TABS: ORAL_TABLET | ORAL | Status: DC

## 2016-01-19 NOTE — ED Notes (Signed)
Pt with back pain and pain that travels down R leg.

## 2016-01-19 NOTE — ED Provider Notes (Signed)
CSN: [id number]     Arrival date & time 01/19/16  1952 History   First MD Initiated Contact with Patient 01/19/16 2041     Chief Complaint  Patient presents with  . Back Pain     (Consider location/radiation/quality/duration/timing/severity/associated sxs/prior Treatment) Patient is a 35 y.o. female presenting with back pain. The history is provided by the patient. No language interpreter was used.  Back Pain Location:  Lumbar spine Quality:  Aching Radiates to:  Does not radiate Pain severity:  Severe Pain is:  Worse during the day Onset quality:  Gradual Timing:  Constant Progression:  Worsening Chronicity:  New Relieved by:  Nothing Worsened by:  Nothing tried Ineffective treatments:  None tried Associated symptoms: leg pain   Risk factors: no recent surgery   Pt has a history of back pain on and off.  Pt reports she had an exacerbation starting in may and has continued pain.  Pt reports pain down her right leg.  Pt has had mri and is scheduled to see a Neurosureon in July.    Past Medical History  Diagnosis Date  . Abnormal Pap smear   . Endometrial hyperplasia without atypia, simple   . GERD (gastroesophageal reflux disease)     tums prn   Past Surgical History  Procedure Laterality Date  . Cesarean section      x3  . Diagnostic laparoscopy      endometriosis  . Cesarean section  04/02/2012    Procedure: CESAREAN SECTION;  Surgeon: Antionette CharLisa Jackson-Moore, MD;  Location: [facility];  Service: Obstetrics;  Laterality: N/A;  Repeat cesarean section with delivery of baby girl at [id number].  Marland Kitchen. Cholecystectomy     Family History  Problem Relation Age of Onset  . Hypertension Mother    Social History  Substance Use Topics  . Smoking status: Former Smoker -- 3 years    Types: Cigarettes    Quit date: 09/05/2007  . Smokeless tobacco: Never Used  . Alcohol Use: No     Comment: rare   OB History    Gravida Para Term Preterm AB TAB SAB Ectopic Multiple Living   3 3 1       3       Review of Systems  Musculoskeletal: Positive for back pain.  All other systems reviewed and are negative.     Allergies  Morphine and related  Home Medications   Prior to Admission medications   Medication Sig Start Date End Date Taking? Authorizing Provider  amoxicillin-clavulanate (AUGMENTIN) 875-125 MG tablet Take 1 tablet by mouth 2 (two) times daily with a meal. For 7 days(started 11/16/15) 11/16/15   Historical Provider, MD  cyclobenzaprine (FLEXERIL) 10 MG tablet Take 1 tablet (10 mg total) by mouth 2 (two) times daily as needed for muscle spasms. 12/18/15   Samantha Tripp Dowless, PA-C  fexofenadine-pseudoephedrine (ALLEGRA-D) 60-120 MG 12 hr tablet Take 1 tablet by mouth every 12 (twelve) hours. 11/18/15   Ivery QualeHobson Bryant, PA-C  fluticasone (FLONASE) 50 MCG/ACT nasal spray Place 2 sprays into both nostrils daily. Patient not taking: Reported on 10/15/2015 09/02/15   Layla MawKristen N Ward, DO  HYDROcodone-acetaminophen (NORCO/VICODIN) 5-325 MG tablet Take one tab po q 4-6 hrs prn pain 01/19/16   Elson AreasLeslie K Sofia, PA-C  ibuprofen (ADVIL,MOTRIN) 800 MG tablet Take 1 tablet (800 mg total) by mouth every 8 (eight) hours as needed for mild pain. 09/02/15   Kristen N Ward, DO  Multiple Vitamins-Minerals (MULTIVITAMIN WITH MINERALS) tablet Take 1 tablet by mouth  daily.    Historical Provider, MD  naproxen (NAPROSYN) 500 MG tablet Take 1 tablet (500 mg total) by mouth 2 (two) times daily with a meal. 10/15/15   Tammy Triplett, PA-C  Omega-3 Fatty Acids (FISH OIL PO) Take 2 capsules by mouth daily.    Historical Provider, MD  ondansetron (ZOFRAN ODT) 4 MG disintegrating tablet Take 1 tablet (4 mg total) by mouth every 8 (eight) hours as needed for nausea or vomiting. Patient not taking: Reported on 10/15/2015 09/02/15   Layla Maw Ward, DO  oseltamivir (TAMIFLU) 75 MG capsule Take 1 capsule (75 mg total) by mouth every 12 (twelve) hours. Patient not taking: Reported on 10/15/2015 09/02/15   Layla Maw Ward, DO   predniSONE (DELTASONE) 10 MG tablet Take 6 tablets day one, 5 tablets day two, 4 tablets day three, 3 tablets day four, 2 tablets day five, then 1 tablet day six Patient not taking: Reported on 11/18/2015 10/15/15   Tammy Triplett, PA-C   BP 106/82 mmHg  Pulse 116  Temp(Src) 98.7 F (37.1 C) (Oral)  Resp 20  Ht  (1.727 m)  Wt 90.719 kg  BMI 30.42 kg/m2  SpO2 97%  LMP 01/04/2016 Physical Exam  Constitutional: She is oriented to person, place, and time. She appears well-developed and well-nourished.  HENT:  Head: Normocephalic.  Right Ear: External ear normal.  Left Ear: External ear normal.  Eyes: EOM are normal. Pupils are equal, round, and reactive to light.  Neck: Normal range of motion.  Cardiovascular: Normal rate.   Pulmonary/Chest: Effort normal.  Abdominal: Soft. She exhibits no distension.  Musculoskeletal: Normal range of motion.  Tender right low back tender right sciatic area,    Neurological: She is alert and oriented to person, place, and time.  Skin: Skin is warm.  Psychiatric: She has a normal mood and affect.  Nursing note and vitals reviewed.   ED Course  Procedures (including critical care time) Labs Review Labs Reviewed - No data to display  Imaging Review No results found. I have personally reviewed and evaluated these images and lab results as part of my medical decision-making.   EKG Interpretation None      MDM   Final diagnoses:  Right-sided low back pain with right-sided sciatica   Meds ordered this encounter  Medications  . HYDROcodone-acetaminophen (NORCO/VICODIN) 5-325 MG tablet    Sig: Take one tab po q 4-6 hrs prn pain    Dispense:  20 tablet    Refill:  0    Order Specific Question:  Supervising Provider    Answer:  Eber Hong [[id number]]   An After Visit Summary was printed and given to the patient.    Elson Areas, PA-C 01/19/16 2126  Bethann Berkshire, MD 01/21/16 [phone]

## 2016-01-19 NOTE — Discharge Instructions (Signed)

## 2016-03-01 ENCOUNTER — Inpatient Hospital Stay (HOSPITAL_COMMUNITY)
Admission: AD | Admit: 2016-03-01 | Discharge: 2016-03-01 | Disposition: A | Discharge status: Home / Self Care | Payer: Medicaid Other | Source: Ambulatory Visit | Attending: Family Medicine | Admitting: Family Medicine

## 2016-03-01 ENCOUNTER — Encounter (HOSPITAL_COMMUNITY): Payer: Self-pay | Admitting: *Deleted

## 2016-03-01 DIAGNOSIS — R300 Dysuria: Secondary | ICD-10-CM | POA: Insufficient documentation

## 2016-03-01 DIAGNOSIS — N801 Endometriosis of ovary: Secondary | ICD-10-CM | POA: Diagnosis not present

## 2016-03-01 DIAGNOSIS — B3731 Acute candidiasis of vulva and vagina: Secondary | ICD-10-CM

## 2016-03-01 DIAGNOSIS — R112 Nausea with vomiting, unspecified: Secondary | ICD-10-CM | POA: Insufficient documentation

## 2016-03-01 DIAGNOSIS — Z9889 Other specified postprocedural states: Secondary | ICD-10-CM | POA: Insufficient documentation

## 2016-03-01 DIAGNOSIS — R102 Pelvic and perineal pain: Secondary | ICD-10-CM | POA: Diagnosis present

## 2016-03-01 DIAGNOSIS — B373 Candidiasis of vulva and vagina: Secondary | ICD-10-CM | POA: Diagnosis not present

## 2016-03-01 DIAGNOSIS — K219 Gastro-esophageal reflux disease without esophagitis: Secondary | ICD-10-CM | POA: Diagnosis not present

## 2016-03-01 DIAGNOSIS — Z79899 Other long term (current) drug therapy: Secondary | ICD-10-CM | POA: Diagnosis not present

## 2016-03-01 DIAGNOSIS — B9689 Other specified bacterial agents as the cause of diseases classified elsewhere: Secondary | ICD-10-CM

## 2016-03-01 DIAGNOSIS — G8929 Other chronic pain: Secondary | ICD-10-CM | POA: Diagnosis not present

## 2016-03-01 DIAGNOSIS — R109 Unspecified abdominal pain: Secondary | ICD-10-CM

## 2016-03-01 DIAGNOSIS — A499 Bacterial infection, unspecified: Secondary | ICD-10-CM | POA: Diagnosis not present

## 2016-03-01 DIAGNOSIS — Z87891 Personal history of nicotine dependence: Secondary | ICD-10-CM | POA: Diagnosis not present

## 2016-03-01 DIAGNOSIS — N760 Acute vaginitis: Secondary | ICD-10-CM

## 2016-03-01 DIAGNOSIS — M545 Low back pain: Secondary | ICD-10-CM | POA: Diagnosis present

## 2016-03-01 DIAGNOSIS — Z87442 Personal history of urinary calculi: Secondary | ICD-10-CM | POA: Insufficient documentation

## 2016-03-01 DIAGNOSIS — R1031 Right lower quadrant pain: Secondary | ICD-10-CM | POA: Diagnosis not present

## 2016-03-01 HISTORY — DX: Unspecified ovarian cyst, unspecified side: N83.209

## 2016-03-01 HISTORY — DX: Endometriosis, unspecified: N80.9

## 2016-03-01 HISTORY — DX: Calculus of kidney: N20.0

## 2016-03-01 LAB — WET PREP, GENITAL
Sperm: NONE SEEN
TRICH WET PREP: NONE SEEN

## 2016-03-01 LAB — RAPID URINE DRUG SCREEN, HOSP PERFORMED
AMPHETAMINES: NOT DETECTED
BARBITURATES: NOT DETECTED
Benzodiazepines: NOT DETECTED
COCAINE: NOT DETECTED
Opiates: NOT DETECTED
TETRAHYDROCANNABINOL: NOT DETECTED

## 2016-03-01 LAB — CBC
HCT: 34.5 % — ABNORMAL LOW (ref 36.0–46.0)
Hemoglobin: 11.7 g/dL — ABNORMAL LOW (ref 12.0–15.0)
MCH: 30 pg (ref 26.0–34.0)
MCHC: 33.9 g/dL (ref 30.0–36.0)
MCV: 88.5 fL (ref 78.0–100.0)
PLATELETS: 257 10*3/uL (ref 150–400)
RBC: 3.9 MIL/uL (ref 3.87–5.11)
RDW: 12.7 % (ref 11.5–15.5)
WBC: 9.3 10*3/uL (ref 4.0–10.5)

## 2016-03-01 LAB — URINALYSIS, ROUTINE W REFLEX MICROSCOPIC
BILIRUBIN URINE: NEGATIVE
Glucose, UA: NEGATIVE mg/dL
HGB URINE DIPSTICK: NEGATIVE
KETONES UR: NEGATIVE mg/dL
Leukocytes, UA: NEGATIVE
NITRITE: NEGATIVE
Protein, ur: NEGATIVE mg/dL
Specific Gravity, Urine: 1.01 (ref 1.005–1.030)
pH: 7 (ref 5.0–8.0)

## 2016-03-01 LAB — POCT PREGNANCY, URINE: PREG TEST UR: NEGATIVE

## 2016-03-01 MED ORDER — METRONIDAZOLE 500 MG PO TABS: 500.0000 mg | ORAL_TABLET | Freq: Two times a day (BID) | ORAL | Status: DC

## 2016-03-01 MED ORDER — FLUCONAZOLE 150 MG PO TABS: 150.0000 mg | ORAL_TABLET | Freq: Every day | ORAL | Status: DC

## 2016-03-01 MED ORDER — KETOROLAC TROMETHAMINE 60 MG/2ML IM SOLN
60.0000 mg | Freq: Once | INTRAMUSCULAR | Status: AC
  Administered 2016-03-01: 60 mg via INTRAMUSCULAR
  Filled 2016-03-01: qty 2

## 2016-03-01 NOTE — MAU Provider Note (Signed)
STUDENT NOTE   History     CSN: [id number]  Arrival date and time: 03/01/16 1403   First Provider Initiated Contact with Patient 03/01/16 1442      Chief Complaint  Patient presents with  . Abdominal Pain   HPI  Patient is a 35 year old G3 35P1003 female who is not pregnant complaining of right lower quadrant pain x 1 day.  She has a past medical history of endometriosis, ruptured ovarian cysts, and right sciatica. She describes this pain as sharp and localized and a 5/10.  She took flexiril with minimal relief. She associates this with on and off nausea and vomiting, dysuria, and white, non-odorous discharge x 1 week.   She is worried that she is pregnant although her home pregnancy test is negative.   Also noted that patient has monthly emergency room visits for back pain and URIs seeking codeine or hydrocodone prescriptions.   Past Medical History  Diagnosis Date  . Abnormal Pap smear   . Endometrial hyperplasia without atypia, simple   . GERD (gastroesophageal reflux disease)     tums prn  . Endometriosis   . Ovarian cyst   . Kidney stone     Past Surgical History  Procedure Laterality Date  . Cesarean section      x3  . Diagnostic laparoscopy      endometriosis  . Cesarean section  04/02/2012    Procedure: CESAREAN SECTION;  Surgeon: Antionette CharLisa Jackson-Moore, MD;  Location: [facility];  Service: Obstetrics;  Laterality: N/A;  Repeat cesarean section with delivery of baby girl at [id number].  Marland Kitchen. Cholecystectomy    . Lithotripsy      Family History  Problem Relation Age of Onset  . Hypertension Mother     Social History  Substance Use Topics  . Smoking status: Former Smoker -- 3 years    Types: Cigarettes    Quit date: 09/05/2007  . Smokeless tobacco: Never Used  . Alcohol Use: No     Comment: rare    Allergies:  Allergies  Allergen Reactions  . Morphine And Related Itching and Nausea And Vomiting  . Codeine Itching and Nausea And Vomiting    Prescriptions prior to  admission  Medication Sig Dispense Refill Last Dose  . cyclobenzaprine (FLEXERIL) 10 MG tablet Take 1 tablet (10 mg total) by mouth 2 (two) times daily as needed for muscle spasms. 20 tablet 0 02/29/2016 at Unknown time  . gabapentin (NEURONTIN) 300 MG capsule Take 300 mg by mouth at bedtime.   Past Week at Unknown time  . fexofenadine-pseudoephedrine (ALLEGRA-D) 60-120 MG 12 hr tablet Take 1 tablet by mouth every 12 (twelve) hours. 20 tablet 0   . fluticasone (FLONASE) 50 MCG/ACT nasal spray Place 2 sprays into both nostrils daily. (Patient not taking: Reported on 10/15/2015) 16 g 0 Not Taking at Unknown time  . ibuprofen (ADVIL,MOTRIN) 800 MG tablet Take 1 tablet (800 mg total) by mouth every 8 (eight) hours as needed for mild pain. (Patient not taking: Reported on 03/01/2016) 30 tablet 0 10/14/2015 at Unknown time  . naproxen (NAPROSYN) 500 MG tablet Take 1 tablet (500 mg total) by mouth 2 (two) times daily with a meal. 10 tablet 0 unknown  . ondansetron (ZOFRAN ODT) 4 MG disintegrating tablet Take 1 tablet (4 mg total) by mouth every 8 (eight) hours as needed for nausea or vomiting. (Patient not taking: Reported on 10/15/2015) 20 tablet 0 Completed Course at Unknown time  . oseltamivir (TAMIFLU) 75 MG  capsule Take 1 capsule (75 mg total) by mouth every 12 (twelve) hours. (Patient not taking: Reported on 10/15/2015) 10 capsule 0 Completed Course at Unknown time  . predniSONE (DELTASONE) 10 MG tablet Take 6 tablets day one, 5 tablets day two, 4 tablets day three, 3 tablets day four, 2 tablets day five, then 1 tablet day six (Patient not taking: Reported on 11/18/2015) 21 tablet 0     Review of Systems  Constitutional: Negative for fever and chills.  HENT: Negative.   Eyes: Negative.   Respiratory: Negative.   Cardiovascular: Negative.   Gastrointestinal: Positive for nausea and vomiting.  Genitourinary: Positive for dysuria and frequency.  Skin: Negative.    Physical Exam   Blood pressure 137/79,  pulse 108, temperature 97.8 F (36.6 C), resp. rate 18, height  (1.727 m), weight 101.424 kg (223 lb 9.6 oz), last menstrual period 01/30/2016.  Physical Exam  Constitutional: She is oriented to person, place, and time. She appears well-developed and well-nourished.  HENT:  Mouth/Throat: Oropharynx is clear and moist.  Eyes: Conjunctivae are normal. Pupils are equal, round, and reactive to light.  Neck: Neck supple.  Cardiovascular: Normal rate, regular rhythm and normal heart sounds.   Respiratory: Effort normal and breath sounds normal.  GI: Soft. Bowel sounds are normal. She exhibits no distension. There is tenderness (diffuse lower abdominal tenderness ) in the right lower quadrant and suprapubic area. There is no rebound and no guarding.  Genitourinary: Uterus normal. Cervix exhibits discharge (white, malodorous ). Cervix exhibits no motion tenderness and no friability. Right adnexum displays tenderness. Right adnexum displays no mass and no fullness. Left adnexum displays no mass, no tenderness and no fullness. No erythema or bleeding in the vagina. Vaginal discharge (white) found.  Lymphadenopathy:    She has no cervical adenopathy.  Neurological: She is alert and oriented to person, place, and time.  Skin: Skin is warm and dry.    MAU Course  Procedures  Procedures  None   MDM  Toradol  CBC  Wet prep  GC  UA  Results for orders placed or performed during the hospital encounter of 03/01/16 (from the past 24 hour(s))  Urinalysis, Routine w reflex microscopic (not at [facility])     Status: None   Collection Time: 03/01/16  2:30 PM  Result Value Ref Range   Color, Urine YELLOW YELLOW   APPearance CLEAR CLEAR   Specific Gravity, Urine 1.010 1.005 - 1.030   pH 7.0 5.0 - 8.0   Glucose, UA NEGATIVE NEGATIVE mg/dL   Hgb urine dipstick NEGATIVE NEGATIVE   Bilirubin Urine NEGATIVE NEGATIVE   Ketones, ur NEGATIVE NEGATIVE mg/dL   Protein, ur NEGATIVE NEGATIVE mg/dL   Nitrite  NEGATIVE NEGATIVE   Leukocytes, UA NEGATIVE NEGATIVE  Pregnancy, urine POC     Status: None   Collection Time: 03/01/16  2:38 PM  Result Value Ref Range   Preg Test, Ur NEGATIVE NEGATIVE  CBC     Status: Abnormal   Collection Time: 03/01/16  3:26 PM  Result Value Ref Range   WBC 9.3 4.0 - 10.5 K/uL   RBC 3.90 3.87 - 5.11 MIL/uL   Hemoglobin 11.7 (L) 12.0 - 15.0 g/dL   HCT 16.1 (L) 09.6 - 04.5 %   MCV 88.5 78.0 - 100.0 fL   MCH 30.0 26.0 - 34.0 pg   MCHC 33.9 30.0 - 36.0 g/dL   RDW 40.9 81.1 - 91.4 %   Platelets 257 150 - 400 K/uL  Wet prep, genital     Status: Abnormal   Collection Time: 03/01/16  3:40 PM  Result Value Ref Range   Yeast Wet Prep HPF POC PRESENT (A) NONE SEEN   Trich, Wet Prep NONE SEEN NONE SEEN   Clue Cells Wet Prep HPF POC PRESENT (A) NONE SEEN   WBC, Wet Prep HPF POC FEW (A) NONE SEEN   Sperm NONE SEEN       Assessment and Plan  35 year old female with a history of endometriosis, low back pain, and monthly visits to the emergency department for narcotics.  Patient has bacterial vaginosis.  Patient advised to use NSAIDs and heat for symptomatic pain relief for pain relief.  Patient given prescription for Flagyl.   Patient to follow-up with gynecologist.    Clyde Canterbury 03/01/2016, 4:33 PM

## 2016-03-01 NOTE — MAU Provider Note (Signed)
History     CSN: [id number]  Arrival date and time: 03/01/16 1403   First Provider Initiated Contact with Patient 03/01/16 1442      Chief Complaint  Patient presents with  . Abdominal Pain   HPI   Ms.Ana Pace is a 35 y.o. female G25P1003,  non-pregnant female with a history of endometriosis here with pelvic pain; pain is worse on the right side. The patient has chronic abdominal pain.  The Patient has ER visits monthly for different pain sources including visits for URI with patient prescribed codeine cough medicines. Patient has GYN, however does not see her for these GYN issues.  She see's [facility]. She has had a laparoscopy in the past for her endometriosis.   The pain is constant, the pain is worse in the middle and right RLQ. She has not taken anything for pain today other than Flexeril. She has an appointment with her Neuro Dr. On Friday.   [place] drug database shows multiple narcotic RX's over the last 6 months.    OB History    Gravida Para Term Preterm AB TAB SAB Ectopic Multiple Living   Past Medical History  Diagnosis Date  . Abnormal Pap smear   . Endometrial hyperplasia without atypia, simple   . GERD (gastroesophageal reflux disease)     tums prn  . Endometriosis   . Ovarian cyst   . Kidney stone     Past Surgical History  Procedure Laterality Date  . Cesarean section      x3  . Diagnostic laparoscopy      endometriosis  . Cesarean section  04/02/2012    Procedure: CESAREAN SECTION;  Surgeon: Antionette Char, MD;  Location: [facility];  Service: Obstetrics;  Laterality: N/A;  Repeat cesarean section with delivery of baby girl at 61.  Marland Kitchen Cholecystectomy    . Lithotripsy      Family History  Problem Relation Age of Onset  . Hypertension Mother     Social History  Substance Use Topics  . Smoking status: Former Smoker -- 3 years    Types: Cigarettes    Quit date: 09/05/2007  . Smokeless tobacco: Never Used  . Alcohol  Use: No     Comment: rare    Allergies:  Allergies  Allergen Reactions  . Morphine And Related Itching and Nausea And Vomiting  . Codeine Itching and Nausea And Vomiting    Prescriptions prior to admission  Medication Sig Dispense Refill Last Dose  . cyclobenzaprine (FLEXERIL) 10 MG tablet Take 1 tablet (10 mg total) by mouth 2 (two) times daily as needed for muscle spasms. 20 tablet 0 02/29/2016 at Unknown time  . gabapentin (NEURONTIN) 300 MG capsule Take 300 mg by mouth at bedtime.   Past Week at Unknown time  . amoxicillin-clavulanate (AUGMENTIN) 875-125 MG tablet Take 1 tablet by mouth 2 (two) times daily with a meal. For 7 days(started 11/16/15)  0 11/18/2015 at Unknown time  . fexofenadine-pseudoephedrine (ALLEGRA-D) 60-120 MG 12 hr tablet Take 1 tablet by mouth every 12 (twelve) hours. 20 tablet 0   . fluticasone (FLONASE) 50 MCG/ACT nasal spray Place 2 sprays into both nostrils daily. (Patient not taking: Reported on 10/15/2015) 16 g 0 Not Taking at Unknown time  . HYDROcodone-acetaminophen (NORCO/VICODIN) 5-325 MG tablet Take one tab po q 4-6 hrs prn pain 20 tablet 0   . ibuprofen (ADVIL,MOTRIN) 800 MG tablet  Take 1 tablet (800 mg total) by mouth every 8 (eight) hours as needed for mild pain. 30 tablet 0 10/14/2015 at Unknown time  . Multiple Vitamins-Minerals (MULTIVITAMIN WITH MINERALS) tablet Take 1 tablet by mouth daily.   10/14/2015 at Unknown time  . naproxen (NAPROSYN) 500 MG tablet Take 1 tablet (500 mg total) by mouth 2 (two) times daily with a meal. 10 tablet 0 unknown  . Omega-3 Fatty Acids (FISH OIL PO) Take 2 capsules by mouth daily.   10/14/2015 at Unknown time  . ondansetron (ZOFRAN ODT) 4 MG disintegrating tablet Take 1 tablet (4 mg total) by mouth every 8 (eight) hours as needed for nausea or vomiting. (Patient not taking: Reported on 10/15/2015) 20 tablet 0 Completed Course at Unknown time  . oseltamivir (TAMIFLU) 75 MG capsule Take 1 capsule (75 mg total) by mouth every 12  (twelve) hours. (Patient not taking: Reported on 10/15/2015) 10 capsule 0 Completed Course at Unknown time  . predniSONE (DELTASONE) 10 MG tablet Take 6 tablets day one, 5 tablets day two, 4 tablets day three, 3 tablets day four, 2 tablets day five, then 1 tablet day six (Patient not taking: Reported on 11/18/2015) 21 tablet 0    Results for orders placed or performed during the hospital encounter of 03/01/16 (from the past 48 hour(s))  Urine rapid drug screen (hosp performed)     Status: None   Collection Time: 03/01/16  2:15 PM  Result Value Ref Range   Opiates NONE DETECTED NONE DETECTED   Cocaine NONE DETECTED NONE DETECTED   Benzodiazepines NONE DETECTED NONE DETECTED   Amphetamines NONE DETECTED NONE DETECTED   Tetrahydrocannabinol NONE DETECTED NONE DETECTED   Barbiturates NONE DETECTED NONE DETECTED    Comment:        DRUG SCREEN FOR MEDICAL PURPOSES ONLY.  IF CONFIRMATION IS NEEDED FOR ANY PURPOSE, NOTIFY LAB WITHIN 5 DAYS.        LOWEST DETECTABLE LIMITS FOR URINE DRUG SCREEN Drug Class       Cutoff (ng/mL) Amphetamine      1000 Barbiturate      200 Benzodiazepine   200 Tricyclics       300 Opiates          300 Cocaine          300 THC              50   Urinalysis, Routine w reflex microscopic (not at [facility])     Status: None   Collection Time: 03/01/16  2:30 PM  Result Value Ref Range   Color, Urine YELLOW YELLOW   APPearance CLEAR CLEAR   Specific Gravity, Urine 1.010 1.005 - 1.030   pH 7.0 5.0 - 8.0   Glucose, UA NEGATIVE NEGATIVE mg/dL   Hgb urine dipstick NEGATIVE NEGATIVE   Bilirubin Urine NEGATIVE NEGATIVE   Ketones, ur NEGATIVE NEGATIVE mg/dL   Protein, ur NEGATIVE NEGATIVE mg/dL   Nitrite NEGATIVE NEGATIVE   Leukocytes, UA NEGATIVE NEGATIVE    Comment: MICROSCOPIC NOT DONE ON URINES WITH NEGATIVE PROTEIN, BLOOD, LEUKOCYTES, NITRITE, OR GLUCOSE <1000 mg/dL.  Pregnancy, urine POC     Status: None   Collection Time: 03/01/16  2:38 PM  Result Value Ref Range    Preg Test, Ur NEGATIVE NEGATIVE    Comment:        THE SENSITIVITY OF THIS METHODOLOGY IS >24 mIU/mL   CBC     Status: Abnormal   Collection Time: 03/01/16  3:26 PM  Result Value Ref Range   WBC 9.3 4.0 - 10.5 K/uL   RBC 3.90 3.87 - 5.11 MIL/uL   Hemoglobin 11.7 (L) 12.0 - 15.0 g/dL   HCT 45.434.5 (L) 09.836.0 - 11.946.0 %   MCV 88.5 78.0 - 100.0 fL   MCH 30.0 26.0 - 34.0 pg   MCHC 33.9 30.0 - 36.0 g/dL   RDW 14.712.7 82.911.5 - 56.215.5 %   Platelets 257 150 - 400 K/uL  Wet prep, genital     Status: Abnormal   Collection Time: 03/01/16  3:40 PM  Result Value Ref Range   Yeast Wet Prep HPF POC PRESENT (A) NONE SEEN   Trich, Wet Prep NONE SEEN NONE SEEN   Clue Cells Wet Prep HPF POC PRESENT (A) NONE SEEN   WBC, Wet Prep HPF POC FEW (A) NONE SEEN    Comment: MODERATE BACTERIA SEEN   Sperm NONE SEEN     Review of Systems  Constitutional: Negative for fever and chills.  Gastrointestinal: Positive for abdominal pain. Negative for nausea, vomiting, diarrhea and constipation.  Genitourinary: Negative for dysuria.   Physical Exam   Blood pressure 129/73, pulse 91, temperature 98 F (36.7 C), resp. rate 18, height 5\' 8"  (1.727 m), weight 223 lb 9.6 oz (101.424 kg), last menstrual period 01/30/2016.  Physical Exam  Constitutional: She is oriented to person, place, and time. Vital signs are normal. She appears well-developed and well-nourished.  Non-toxic appearance. She does not have a sickly appearance. She does not appear ill. No distress.  Eyes: Pupils are equal, round, and reactive to light.  Respiratory: Effort normal.  GI: Soft. Normal appearance. There is tenderness (Patient talking through abdominal exam ) in the right lower quadrant and suprapubic area. There is no rigidity, no rebound and no guarding.  Genitourinary:  Speculum exam: Vagina - Small amount of creamy, white discharge, strong fishy odor Cervix - No contact bleeding Bimanual exam: Cervix closed Uterus non tender, normal  size Right adnexal tenderness, + suprapubic tenderness  GC/Chlam, wet prep done Chaperone present for exam.  Musculoskeletal: Normal range of motion.  Neurological: She is alert and oriented to person, place, and time.  Skin: Skin is warm. She is not diaphoretic.  Psychiatric: Her behavior is normal.    MAU Course  Procedures  None  MDM  Toradol CBC Wet prep  GC UA  Pain down to a 4/10 follow Toradol   Assessment and Plan   A:  1. Abdominal pain in female   2. Yeast vaginitis   3. BV (bacterial vaginosis)     P:  Discharge home in stable condition Rx: Diflucan, Flagyl Follow up with [facility]- call to schedule appointment. Patient may need Pelvic US Return to MAU for emergencies only No narcotics were given at this visit Ok to use Ibuprofen over the counter, as directed on the bottle  Duane LopeJennifer I Janeshia Ciliberto, NP 03/01/2016 7:09 PM

## 2016-03-01 NOTE — Discharge Instructions (Signed)
Bacterial Vaginosis °Bacterial vaginosis is a vaginal infection that occurs when the normal balance of bacteria in the vagina is disrupted. It results from an overgrowth of certain bacteria. This is the most common vaginal infection in women of childbearing age. Treatment is important to prevent complications, especially in pregnant women, as it can cause a premature delivery. °CAUSES  °Bacterial vaginosis is caused by an increase in harmful bacteria that are normally present in smaller amounts in the vagina. Several different kinds of bacteria can cause bacterial vaginosis. However, the reason that the condition develops is not fully understood. °RISK FACTORS °Certain activities or behaviors can put you at an increased risk of developing bacterial vaginosis, including: °· Having a new sex partner or multiple sex partners. °· Douching. °· Using an intrauterine device (IUD) for contraception. °Women do not get bacterial vaginosis from toilet seats, bedding, swimming pools, or contact with objects around them. °SIGNS AND SYMPTOMS  °Some women with bacterial vaginosis have no signs or symptoms. Common symptoms include: °· Grey vaginal discharge. °· A fishlike odor with discharge, especially after sexual intercourse. °· Itching or burning of the vagina and vulva. °· Burning or pain with urination. °DIAGNOSIS  °Your health care provider will take a medical history and examine the vagina for signs of bacterial vaginosis. A sample of vaginal fluid may be taken. Your health care provider will look at this sample under a microscope to check for bacteria and abnormal cells. A vaginal pH test may also be done.  °TREATMENT  °Bacterial vaginosis may be treated with antibiotic medicines. These may be given in the form of a pill or a vaginal cream. A second round of antibiotics may be prescribed if the condition comes back after treatment. Because bacterial vaginosis increases your risk for sexually transmitted diseases, getting  treated can help reduce your risk for chlamydia, gonorrhea, HIV, and herpes. °HOME CARE INSTRUCTIONS  °· Only take over-the-counter or prescription medicines as directed by your health care provider. °· If antibiotic medicine was prescribed, take it as directed. Make sure you finish it even if you start to feel better. °· Tell all sexual partners that you have a vaginal infection. They should see their health care provider and be treated if they have problems, such as a mild rash or itching. °· During treatment, it is important that you follow these instructions: °¨ Avoid sexual activity or use condoms correctly. °¨ Do not douche. °¨ Avoid alcohol as directed by your health care provider. °¨ Avoid breastfeeding as directed by your health care provider. °SEEK MEDICAL CARE IF:  °· Your symptoms are not improving after 3 days of treatment. °· You have increased discharge or pain. °· You have a fever. °MAKE SURE YOU:  °· Understand these instructions. °· Will watch your condition. °· Will get help right away if you are not doing well or get worse. °FOR MORE INFORMATION  °Centers for Disease Control and Prevention, Division of STD Prevention: www.cdc.gov/std °American Sexual Health Association (ASHA): www.ashastd.org  °  °This information is not intended to replace advice given to you by your health care provider. Make sure you discuss any questions you have with your health care provider. °  °Document Released: 08/01/2005 Document Revised: 08/22/2014 Document Reviewed: 03/13/2013 °Elsevier Interactive Patient Education ©2016 Elsevier Inc. ° °Pelvic Pain, Female °Female pelvic pain can be caused by many different things and start from a variety of places. Pelvic pain refers to pain that is located in the lower half of the abdomen and   between your hips. The pain may occur over a short period of time (acute) or may be reoccurring (chronic). The cause of pelvic pain may be related to disorders affecting the female  reproductive organs (gynecologic), but it may also be related to the bladder, kidney stones, an intestinal complication, or muscle or skeletal problems. Getting help right away for pelvic pain is important, especially if there has been severe, sharp, or a sudden onset of unusual pain. It is also important to get help right away because some types of pelvic pain can be life threatening.  °CAUSES  °Below are only some of the causes of pelvic pain. The causes of pelvic pain can be in one of several categories.  °· Gynecologic. °¨ Pelvic inflammatory disease. °¨ Sexually transmitted infection. °¨ Ovarian cyst or a twisted ovarian ligament (ovarian torsion). °¨ Uterine lining that grows outside the uterus (endometriosis). °¨ Fibroids, cysts, or tumors. °¨ Ovulation. °· Pregnancy. °¨ Pregnancy that occurs outside the uterus (ectopic pregnancy). °¨ Miscarriage. °¨ Labor. °¨ Abruption of the placenta or ruptured uterus. °· Infection. °¨ Uterine infection (endometritis). °¨ Bladder infection. °¨ Diverticulitis. °¨ Miscarriage related to a uterine infection (septic abortion). °· Bladder. °¨ Inflammation of the bladder (cystitis). °¨ Kidney stone(s). °· Gastrointestinal. °¨ Constipation. °¨ Diverticulitis. °· Neurologic. °¨ Trauma. °¨ Feeling pelvic pain because of mental or emotional causes (psychosomatic). °· Cancers of the bowel or pelvis. °EVALUATION  °Your caregiver will want to take a careful history of your concerns. This includes recent changes in your health, a careful gynecologic history of your periods (menses), and a sexual history. Obtaining your family history and medical history is also important. Your caregiver may suggest a pelvic exam. A pelvic exam will help identify the location and severity of the pain. It also helps in the evaluation of which organ system may be involved. In order to identify the cause of the pelvic pain and be properly treated, your caregiver may order tests. These tests may include:   °· A pregnancy test. °· Pelvic ultrasonography. °· An X-ray exam of the abdomen. °· A urinalysis or evaluation of vaginal discharge. °· Blood tests. °HOME CARE INSTRUCTIONS  °· Only take over-the-counter or prescription medicines for pain, discomfort, or fever as directed by your caregiver.   °· Rest as directed by your caregiver.   °· Eat a balanced diet.   °· Drink enough fluids to make your urine clear or pale yellow, or as directed.   °· Avoid sexual intercourse if it causes pain.   °· Apply warm or cold compresses to the lower abdomen depending on which one helps the pain.   °· Avoid stressful situations.   °· Keep a journal of your pelvic pain. Write down when it started, where the pain is located, and if there are things that seem to be associated with the pain, such as food or your menstrual cycle. °· Follow up with your caregiver as directed.   °SEEK MEDICAL CARE IF: °· Your medicine does not help your pain. °· You have abnormal vaginal discharge. °SEEK IMMEDIATE MEDICAL CARE IF:  °· You have heavy bleeding from the vagina.   °· Your pelvic pain increases.   °· You feel light-headed or faint.   °· You have chills.   °· You have pain with urination or blood in your urine.   °· You have uncontrolled diarrhea or vomiting.   °· You have a fever or persistent symptoms for more than 3 days. °· You have a fever and your symptoms suddenly get worse.   °· You are being physically or   sexually abused. °  °This information is not intended to replace advice given to you by your health care provider. Make sure you discuss any questions you have with your health care provider. °  °Document Released: 06/28/2004 Document Revised: 04/22/2015 Document Reviewed: 11/21/2011 °Elsevier Interactive Patient Education ©2016 Elsevier Inc. ° °

## 2016-03-01 NOTE — MAU Note (Signed)
RLQ pain since last night. My period is late and took home upt and was negative. Hx endometriosis. Hx ovarian cysts. Milky vag d/c without odor. LMP 01/30/16

## 2016-03-01 NOTE — Progress Notes (Signed)
J Rasch NP in to discuss test results and d/c plan. Written and verbal d/c instructions given and understanding voiced. 

## 2016-03-02 ENCOUNTER — Inpatient Hospital Stay (HOSPITAL_COMMUNITY)
Admission: AD | Admit: 2016-03-02 | Discharge: 2016-03-02 | Disposition: A | Discharge status: Home / Self Care | Payer: Medicaid Other | Source: Ambulatory Visit | Attending: Obstetrics & Gynecology | Admitting: Obstetrics & Gynecology

## 2016-03-02 ENCOUNTER — Ambulatory Visit: Payer: Medicaid Other | Admitting: Obstetrics & Gynecology

## 2016-03-02 DIAGNOSIS — Z87891 Personal history of nicotine dependence: Secondary | ICD-10-CM | POA: Diagnosis not present

## 2016-03-02 DIAGNOSIS — Z3202 Encounter for pregnancy test, result negative: Secondary | ICD-10-CM | POA: Diagnosis not present

## 2016-03-02 DIAGNOSIS — R1031 Right lower quadrant pain: Secondary | ICD-10-CM | POA: Insufficient documentation

## 2016-03-02 DIAGNOSIS — Z87442 Personal history of urinary calculi: Secondary | ICD-10-CM | POA: Diagnosis not present

## 2016-03-02 DIAGNOSIS — Z885 Allergy status to narcotic agent status: Secondary | ICD-10-CM | POA: Diagnosis not present

## 2016-03-02 DIAGNOSIS — R109 Unspecified abdominal pain: Secondary | ICD-10-CM | POA: Diagnosis present

## 2016-03-02 DIAGNOSIS — N801 Endometriosis of ovary: Secondary | ICD-10-CM | POA: Diagnosis not present

## 2016-03-02 DIAGNOSIS — N809 Endometriosis, unspecified: Secondary | ICD-10-CM

## 2016-03-02 DIAGNOSIS — K219 Gastro-esophageal reflux disease without esophagitis: Secondary | ICD-10-CM | POA: Insufficient documentation

## 2016-03-02 LAB — URINALYSIS, ROUTINE W REFLEX MICROSCOPIC
BILIRUBIN URINE: NEGATIVE
GLUCOSE, UA: NEGATIVE mg/dL
HGB URINE DIPSTICK: NEGATIVE
KETONES UR: NEGATIVE mg/dL
Leukocytes, UA: NEGATIVE
Nitrite: NEGATIVE
PROTEIN: NEGATIVE mg/dL
Specific Gravity, Urine: 1.01 (ref 1.005–1.030)
pH: 6.5 (ref 5.0–8.0)

## 2016-03-02 LAB — HCG, QUANTITATIVE, PREGNANCY

## 2016-03-02 LAB — CBC
HCT: 35.2 % — ABNORMAL LOW (ref 36.0–46.0)
HEMOGLOBIN: 11.9 g/dL — AB (ref 12.0–15.0)
MCH: 29.8 pg (ref 26.0–34.0)
MCHC: 33.8 g/dL (ref 30.0–36.0)
MCV: 88 fL (ref 78.0–100.0)
PLATELETS: 231 10*3/uL (ref 150–400)
RBC: 4 MIL/uL (ref 3.87–5.11)
RDW: 12.5 % (ref 11.5–15.5)
WBC: 8.2 10*3/uL (ref 4.0–10.5)

## 2016-03-02 LAB — GC/CHLAMYDIA PROBE AMP (~~LOC~~) NOT AT ARMC
CHLAMYDIA, DNA PROBE: NEGATIVE
NEISSERIA GONORRHEA: NEGATIVE

## 2016-03-02 LAB — POCT PREGNANCY, URINE: Preg Test, Ur: NEGATIVE

## 2016-03-02 NOTE — MAU Provider Note (Signed)
History     CSN: [id number]  Arrival date and time: 03/02/16 1045   None     Chief Complaint  Patient presents with  . Abdominal Pain   HPI  Ana Pace is a 35 y.o. female who presents with abdominal pain. Was evaluated in MAU yesterday with same complaint. States pain is the same as it was yesterday prior to receiving toradol in MAU. Has not taken anything for pain since yesterday. Rates pain 7/10 & describes as intermittent sharp pain. Has history of endometriosis & ovarian cysts. Denies n/v/d, constipation, dysuria, vaginal bleeding. Currently being treated for BV & yeast.  Is a pt of [facility]; tried to get appointment this morning for pain but was told she wouldn't be seen today.  Patient is concerned she is pregnant d/t late period (by 2 days) & is requesting blood pregnancy test.   OB History    Gravida Para Term Preterm AB TAB SAB Ectopic Multiple Living   Past Medical History  Diagnosis Date  . Abnormal Pap smear   . Endometrial hyperplasia without atypia, simple   . GERD (gastroesophageal reflux disease)     tums prn  . Endometriosis   . Ovarian cyst   . Kidney stone     Past Surgical History  Procedure Laterality Date  . Cesarean section      x3  . Diagnostic laparoscopy      endometriosis  . Cesarean section  04/02/2012    Procedure: CESAREAN SECTION;  Surgeon: Antionette Char, MD;  Location: [facility];  Service: Obstetrics;  Laterality: N/A;  Repeat cesarean section with delivery of baby girl at 11.  Marland Kitchen Cholecystectomy    . Lithotripsy      Family History  Problem Relation Age of Onset  . Hypertension Mother     Social History  Substance Use Topics  . Smoking status: Former Smoker -- 3 years    Types: Cigarettes    Quit date: 09/05/2007  . Smokeless tobacco: Never Used  . Alcohol Use: No     Comment: rare    Allergies:  Allergies  Allergen Reactions  . Morphine And Related Itching and Nausea And Vomiting  . Codeine  Itching and Nausea And Vomiting    No prescriptions prior to admission    Review of Systems  Constitutional: Negative for fever and chills.  Gastrointestinal: Positive for abdominal pain.  Genitourinary: Negative.    Physical Exam   Blood pressure 117/82, pulse 101, temperature 98.7 F (37.1 C), resp. rate 18, last menstrual period 01/30/2016.  Physical Exam  Nursing note and vitals reviewed. Constitutional: She is oriented to person, place, and time. She appears well-developed and well-nourished. No distress.  HENT:  Head: Normocephalic and atraumatic.  Eyes: Conjunctivae are normal. Right eye exhibits no discharge. Left eye exhibits no discharge. No scleral icterus.  Neck: Normal range of motion.  Cardiovascular: Normal rate, regular rhythm and normal heart sounds.   No murmur heard. Respiratory: Effort normal and breath sounds normal. No respiratory distress. She has no wheezes.  GI: Soft. Bowel sounds are normal. She exhibits no distension. There is tenderness (reports tenderness with deep palpation; able to talk through exam) in the right lower quadrant. There is no rigidity and no guarding.  Genitourinary:  Pelvic deferred as pt was evaluated yesterday  Neurological: She is alert and oriented to person, place, and time.  Skin: Skin is warm and  dry. She is not diaphoretic.  Psychiatric: She has a normal mood and affect. Her behavior is normal. Judgment and thought content normal.    MAU Course  Procedures Results for orders placed or performed during the hospital encounter of 03/02/16 (from the past 24 hour(s))  Urinalysis, Routine w reflex microscopic (not at [facility])     Status: None   Collection Time: 03/02/16 11:02 AM  Result Value Ref Range   Color, Urine YELLOW YELLOW   APPearance CLEAR CLEAR   Specific Gravity, Urine 1.010 1.005 - 1.030   pH 6.5 5.0 - 8.0   Glucose, UA NEGATIVE NEGATIVE mg/dL   Hgb urine dipstick NEGATIVE NEGATIVE   Bilirubin Urine NEGATIVE  NEGATIVE   Ketones, ur NEGATIVE NEGATIVE mg/dL   Protein, ur NEGATIVE NEGATIVE mg/dL   Nitrite NEGATIVE NEGATIVE   Leukocytes, UA NEGATIVE NEGATIVE  Pregnancy, urine POC     Status: None   Collection Time: 03/02/16 11:06 AM  Result Value Ref Range   Preg Test, Ur NEGATIVE NEGATIVE  CBC     Status: Abnormal   Collection Time: 03/02/16 11:36 AM  Result Value Ref Range   WBC 8.2 4.0 - 10.5 K/uL   RBC 4.00 3.87 - 5.11 MIL/uL   Hemoglobin 11.9 (L) 12.0 - 15.0 g/dL   HCT 40.935.2 (L) 81.136.0 - 91.446.0 %   MCV 88.0 78.0 - 100.0 fL   MCH 29.8 26.0 - 34.0 pg   MCHC 33.8 30.0 - 36.0 g/dL   RDW 78.212.5 95.611.5 - 21.315.5 %   Platelets 231 150 - 400 K/uL  hCG, quantitative, pregnancy     Status: None   Collection Time: 03/02/16 11:36 AM  Result Value Ref Range   hCG, Beta Chain, Quant, S <1 <5 mIU/mL    MDM UPT negative BHCG negative Pt declined pain medication in MAU Discussed all results with patient & offered pt [facility]; pt agreeable to plan NAD, VSS Assessment and Plan  A: 1. Right lower quadrant abdominal pain   2. Negative pregnancy test   3. Endometriosis    P: Discharge home Outpatient ultrasound ordered Msg sent to Dr. Clearance CootsHarper & R. Denney [facility] Discussed reasons to return to MAU vs ED Take NSAID for pain per bottle instructions  Judeth Hornrin Dim Meisinger 03/02/2016, 12:47 PM

## 2016-03-02 NOTE — Discharge Instructions (Signed)
Endometriosis Endometriosis is a condition in which the tissue that lines the uterus (endometrium) grows outside of its normal location. The tissue may grow in many locations close to the uterus, but it commonly grows on the ovaries, fallopian tubes, vagina, or bowel. Because the uterus expels, or sheds, its lining every menstrual cycle, there is bleeding wherever the endometrial tissue is located. This can cause pain because blood is irritating to tissues not normally exposed to it.  CAUSES  The cause of endometriosis is not known.  SIGNS AND SYMPTOMS  Often, there are no symptoms. When symptoms are present, they can vary with the location of the displaced tissue. Various symptoms can occur at different times. Although symptoms occur mainly during a woman's menstrual period, they can also occur midcycle and usually stop with menopause. Some people may go months with no symptoms at all. Symptoms may include:   Back or abdominal pain.   Heavier bleeding during periods.   Pain during intercourse.   Painful bowel movements.   Infertility. DIAGNOSIS  Your health care provider will do a physical exam and ask about your symptoms. Various tests may be done, such as:   Blood tests and urine tests. These are done to help rule out other problems.   Ultrasound. This test is done to look for abnormal tissue.   An X-ray of the lower bowel (barium enema).  Laparoscopy. In this procedure, a thin, lighted tube with a tiny camera on the end (laparoscope) is inserted into your abdomen. This helps your health care provider look for abnormal tissue to confirm the diagnosis. The health care provider may also remove a small piece of tissue (biopsy) from any abnormal tissue found. This tissue sample can then be sent to a lab so it can be looked at under a microscope. TREATMENT  Treatment will vary and may include:   Medicines to relieve pain. Nonsteroidal anti-inflammatory drugs (NSAIDs) are a type of  pain medicine that can help to relieve the pain caused by endometriosis.  Hormonal therapy. When using hormonal therapy, periods are eliminated. This eliminates the monthly exposure to blood by the displaced endometrial tissue.   Surgery. Surgery may sometimes be done to remove the abnormal endometrial tissue. In severe cases, surgery may be done to remove the fallopian tubes, uterus, and ovaries (hysterectomy). HOME CARE INSTRUCTIONS   Take all medicines as directed by your health care provider. Do not take aspirin because it may increase bleeding when you are not on hormonal therapy.   Avoid activities that produce pain, including sexual activity. SEEK MEDICAL CARE IF:  You have pelvic pain before, after, or during your periods.  You have pelvic pain between periods that gets worse during your period.  You have pelvic pain during or after sex.  You have pelvic pain with bowel movements or urination, especially during your period.  You have problems getting pregnant.  You have a fever. SEEK IMMEDIATE MEDICAL CARE IF:   Your pain is severe and is not responding to pain medicine.   You have severe nausea and vomiting, or you cannot keep foods down.   You have pain that is limited to the right lower part of your abdomen.   You have swelling or increasing pain in your abdomen.   You see blood in your stool.  MAKE SURE YOU:   Understand these instructions.  Will watch your condition.  Will get help right away if you are not doing well or get worse.   This information  is not intended to replace advice given to you by your health care provider. Make sure you discuss any questions you have with your health care provider.   Document Released: 07/29/2000 Document Revised: 08/22/2014 Document Reviewed: 03/29/2013 Elsevier Interactive Patient Education 2016 Elsevier Inc. Pelvic Pain, Female Pelvic pain is pain felt below the belly button and between your hips. It can be  caused by many different things. It is important to get help right away. This is especially true for severe, sharp, or unusual pain that comes on suddenly.  HOME CARE  Only take medicine as told by your doctor.  Rest as told by your doctor.  Eat a healthy diet, such as fruits, vegetables, and lean meats.  Drink enough fluids to keep your pee (urine) clear or pale yellow, or as told.  Avoid sex (intercourse) if it causes pain.  Apply warm or cold packs to your lower belly (abdomen). Use the type of pack that helps the pain.  Avoid situations that cause you stress.  Keep a journal to track your pain. Write down:  When the pain started.  Where it is located.  If there are things that seem to be related to the pain, such as food or your period.  Follow up with your doctor as told. GET HELP RIGHT AWAY IF:   You have heavy bleeding from the vagina.  You have more pelvic pain.  You feel lightheaded or pass out (faint).  You have chills.  You have pain when you pee or have blood in your pee.  You cannot stop having watery poop (diarrhea).  You cannot stop throwing up (vomiting).  You have a fever or lasting symptoms for more than 3 days.  You have a fever and your symptoms suddenly get worse.  You are being physically or sexually abused.  Your medicine does not help your pain.  You have fluid (discharge) coming from your vagina that is not normal. MAKE SURE YOU:  Understand these instructions.  Will watch your condition.  Will get help if you are not doing well or get worse.   This information is not intended to replace advice given to you by your health care provider. Make sure you discuss any questions you have with your health care provider.   Document Released: 01/18/2008 Document Revised: 08/22/2014 Document Reviewed: 11/21/2011 Elsevier Interactive Patient Education [employer] Elsevier Inc.

## 2016-03-02 NOTE — MAU Note (Signed)
Pt presents to MAU with complaints of pain in the lower right side of her abdomen. PT states she was seen in MAU yesterday and the pain is worse today and she thinks she may be pregnant.

## 2016-03-07 ENCOUNTER — Other Ambulatory Visit: Payer: Self-pay | Admitting: Neurosurgery

## 2016-03-07 DIAGNOSIS — M47816 Spondylosis without myelopathy or radiculopathy, lumbar region: Secondary | ICD-10-CM

## 2016-03-09 ENCOUNTER — Ambulatory Visit (HOSPITAL_COMMUNITY)
Admission: RE | Admit: 2016-03-09 | Discharge: 2016-03-09 | Disposition: A | Discharge status: Home / Self Care | Payer: Medicaid Other | Source: Ambulatory Visit | Attending: Student | Admitting: Student

## 2016-03-09 DIAGNOSIS — N809 Endometriosis, unspecified: Secondary | ICD-10-CM

## 2016-03-09 DIAGNOSIS — N83202 Unspecified ovarian cyst, left side: Secondary | ICD-10-CM | POA: Insufficient documentation

## 2016-03-09 DIAGNOSIS — R1031 Right lower quadrant pain: Secondary | ICD-10-CM | POA: Insufficient documentation

## 2016-03-09 DIAGNOSIS — Z3202 Encounter for pregnancy test, result negative: Secondary | ICD-10-CM | POA: Diagnosis present

## 2016-03-09 DIAGNOSIS — R938 Abnormal findings on diagnostic imaging of other specified body structures: Secondary | ICD-10-CM | POA: Diagnosis not present

## 2016-03-11 ENCOUNTER — Ambulatory Visit
Admission: RE | Admit: 2016-03-11 | Discharge: 2016-03-11 | Disposition: A | Discharge status: Home / Self Care | Payer: Medicaid Other | Source: Ambulatory Visit | Attending: Neurosurgery | Admitting: Neurosurgery

## 2016-03-11 ENCOUNTER — Telehealth: Payer: Self-pay | Admitting: *Deleted

## 2016-03-11 DIAGNOSIS — M47816 Spondylosis without myelopathy or radiculopathy, lumbar region: Secondary | ICD-10-CM

## 2016-03-11 MED ORDER — METHYLPREDNISOLONE ACETATE 40 MG/ML INJ SUSP (RADIOLOG
120.0000 mg | Freq: Once | INTRAMUSCULAR | Status: AC
  Administered 2016-03-11: 120 mg via EPIDURAL

## 2016-03-11 MED ORDER — IOPAMIDOL (ISOVUE-M 200) INJECTION 41%
1.0000 mL | Freq: Once | INTRAMUSCULAR | Status: AC
  Administered 2016-03-11: 1 mL via EPIDURAL

## 2016-03-11 NOTE — Discharge Instructions (Signed)

## 2016-03-11 NOTE — Telephone Encounter (Signed)
Received message left on nurse voicemail by patient on 03/11/16 at 1104 and again at 1118.  Patient states she would like to get her U/S results from Wednesday 03/09/16.  Requests a return call to [phone].

## 2016-03-14 ENCOUNTER — Telehealth: Payer: Self-pay | Admitting: Obstetrics & Gynecology

## 2016-03-14 ENCOUNTER — Telehealth: Payer: Self-pay | Admitting: *Deleted

## 2016-03-14 ENCOUNTER — Encounter: Payer: Self-pay | Admitting: Obstetrics & Gynecology

## 2016-03-14 NOTE — Telephone Encounter (Signed)
[facility] OB[facility]N Attending Phone Call Documentation  I received a call from Ana Pace wanting to know recent ultrasound results. Still has right sided pain.  03/09/2016  TRANSABDOMINAL AND TRANSVAGINAL ULTRASOUND OF PELVIS  CLINICAL DATA:  Sharp intermittent right lower quadrant pain for the past week, history of endometriosis and ovarian cysts  COMPARISON:  No recent pelvic ultrasounds for review. An abdominal and pelvic CT scan of July 21, 2015 was reviewed. FINDINGS: Visualization of the ovaries is limited due to overlying gas-filled bowel. Uterus Measurements: 9.9 x 5.2 x 6.5 cm. No fibroids or other mass visualized. There may be a sub septate configuration but this is not a definite finding. Endometrium Thickness: 22.0. No abnormal endometrial fluid collections are observed. Right ovary Measurements: 3.5 x 2.8 x 3.2 cm. The ovarian echotexture is grossly normal. No dominant cyst was observed. Left ovary Measurements: 2.5 x 3.4 x 3.3 cm. There is a septated cystic structure measuring approximately 2 cm in greatest dimension. Other findings There is a small amount of free pelvic fluid. IMPRESSION: 1. 2 cm septated cystic structure in the left ovary. Normal appearing right ovary. 2. Thickened endometrium at 22 mm without evidence of a discrete mass nor fluid collection. This may reflect the patient's premenstrual status. Correlation with the presence or absence of the bleeding is needed. 3. The uterus is not enlarged. There may be a subseptate configuration. 4. No findings to explain the patient's right-sided pelvic discomfort. Electronically Signed   By: David  [place] M.D.   On: 03/09/2016 14:23  Results discussed with her, emphasized there is no visualized structural etiology for her R sided pain. She reports that she has missed her period, she was told to take another UPT and come in for evaluation if pregnant or has worsening symptoms.  Ana Collins, MD, FACOG Attending  Obstetrician & Gynecologist, [facility] for [employer], [facility]

## 2016-03-14 NOTE — Telephone Encounter (Signed)
Received message left on nurse voicemail on 03/14/16 at 0901.  Patient states she would like to get her radiology test results.  Requests a return call to [phone].

## 2016-03-14 NOTE — Telephone Encounter (Signed)
Patient is calling for her [place] results from last Wednesday. Please let us know what to tell her.

## 2016-03-15 ENCOUNTER — Other Ambulatory Visit: Payer: Self-pay | Admitting: Certified Nurse Midwife

## 2016-03-15 NOTE — Telephone Encounter (Signed)
Has she had a period? If not she may need a consultation with faculty practice.  R.Denney CNM

## 2016-03-16 NOTE — Telephone Encounter (Signed)
Called Ana Pace and left a message I was returning her call. Please call our office back.

## 2016-03-18 ENCOUNTER — Encounter: Payer: Self-pay | Admitting: *Deleted

## 2016-03-18 NOTE — Telephone Encounter (Signed)
Called patient and call went straight to voice mail. Left message stating I am calling to give her her ultrasound results she requested. She can return my call on Monday. Will send a letter.

## 2016-03-28 ENCOUNTER — Encounter: Payer: Self-pay | Admitting: *Deleted

## 2016-03-28 NOTE — Telephone Encounter (Signed)
Number changed,disconnected,no longer in service.

## 2016-03-28 NOTE — Telephone Encounter (Signed)
Letter mailed requesting patient call for appointment.

## 2016-03-31 ENCOUNTER — Encounter (HOSPITAL_COMMUNITY): Payer: Self-pay

## 2016-03-31 ENCOUNTER — Emergency Department (HOSPITAL_COMMUNITY)
Admission: EM | Admit: 2016-03-31 | Discharge: 2016-03-31 | Disposition: A | Discharge status: Home / Self Care | Payer: Medicaid Other | Attending: Emergency Medicine | Admitting: Emergency Medicine

## 2016-03-31 DIAGNOSIS — Z79899 Other long term (current) drug therapy: Secondary | ICD-10-CM | POA: Diagnosis not present

## 2016-03-31 DIAGNOSIS — G8929 Other chronic pain: Secondary | ICD-10-CM

## 2016-03-31 DIAGNOSIS — Z87891 Personal history of nicotine dependence: Secondary | ICD-10-CM | POA: Insufficient documentation

## 2016-03-31 DIAGNOSIS — M549 Dorsalgia, unspecified: Secondary | ICD-10-CM

## 2016-03-31 DIAGNOSIS — M545 Low back pain: Secondary | ICD-10-CM | POA: Insufficient documentation

## 2016-03-31 MED ORDER — OXYCODONE-ACETAMINOPHEN 5-325 MG PO TABS
1.0000 | ORAL_TABLET | Freq: Once | ORAL | Status: AC
Start: 2016-03-31 — End: 2016-03-31
  Administered 2016-03-31: 1 via ORAL
  Filled 2016-03-31: qty 1

## 2016-03-31 MED ORDER — ONDANSETRON 8 MG PO TBDP
8.0000 mg | ORAL_TABLET | Freq: Once | ORAL | Status: AC
  Administered 2016-03-31: 8 mg via ORAL
  Filled 2016-03-31: qty 1

## 2016-03-31 MED ORDER — ONDANSETRON HCL 4 MG PO TABS: 4.0000 mg | ORAL_TABLET | Freq: Four times a day (QID) | ORAL | 0 refills | Status: DC

## 2016-03-31 MED ORDER — DIAZEPAM 5 MG PO TABS
5.0000 mg | ORAL_TABLET | Freq: Once | ORAL | Status: AC
  Administered 2016-03-31: 5 mg via ORAL
  Filled 2016-03-31: qty 1

## 2016-03-31 MED ORDER — KETOROLAC TROMETHAMINE 60 MG/2ML IM SOLN
60.0000 mg | Freq: Once | INTRAMUSCULAR | Status: AC
  Administered 2016-03-31: 60 mg via INTRAMUSCULAR
  Filled 2016-03-31: qty 2

## 2016-03-31 MED ORDER — OXYCODONE-ACETAMINOPHEN 5-325 MG PO TABS: 1.0000 | ORAL_TABLET | ORAL | 0 refills | Status: DC | PRN

## 2016-03-31 NOTE — ED Triage Notes (Signed)
Lower back pain radiating into right lower leg. Had steroid injections put in 2 weeks ago, now pain coming back.

## 2016-03-31 NOTE — ED Provider Notes (Signed)
[facility]PT Provider Note   CSN: [id number] Arrival date & time: 03/31/16  1649     History   Chief Complaint Chief Complaint  Patient presents with  . Back Pain    HPI Ana Pace is a 35 y.o. female.  HPI   Ana Pace is a 35 y.o. female who presents to the Emergency Department complaining of diffuse low back pain that has been worsening since yesterday.  She is currently being treated by Dr. Channing Muttersoy and received an epidural spinal injection about two weeks ago.  She states that she was feeling better after the injection, but worked a long shift yesterday and pain has now increased.  She states the pain typically radiates into her right lateral thigh and now into her medial lower leg and left upper leg as well and associated with "tingling sensation" into her lower right leg.  Pain is worse with weight bearing.  She has taken gabapentin and flexeril without relief.  She called Dr. Temple Pacinioy's office today but was unable to be seen until Monday.  She denies abd pain, swelling, incontinence/retention of bowel or bladder, fever or chills.     Past Medical History:  Diagnosis Date  . Abnormal Pap smear   . Endometrial hyperplasia without atypia, simple   . Endometriosis   . GERD (gastroesophageal reflux disease)    tums prn  . Kidney stone   . Ovarian cyst     Patient Active Problem List   Diagnosis Date Noted  . Endometriosis 04/02/2015  . Cesarean delivery, without mention of indication, delivered, with or without mention of antepartum condition 04/02/2012    Past Surgical History:  Procedure Laterality Date  . CESAREAN SECTION     x3  . CESAREAN SECTION  04/02/2012   Procedure: CESAREAN SECTION;  Surgeon: Antionette CharLisa Jackson-Moore, MD;  Location: [facility];  Service: Obstetrics;  Laterality: N/A;  Repeat cesarean section with delivery of baby girl at [id number].  . CHOLECYSTECTOMY    . DIAGNOSTIC LAPAROSCOPY     endometriosis  . LITHOTRIPSY      OB History    Gravida  Para Term Preterm AB Living   3 3 1     3    SAB TAB Ectopic Multiple Live Births           1       Home Medications    Prior to Admission medications   Medication Sig Start Date End Date Taking? Authorizing Provider  cyclobenzaprine (FLEXERIL) 10 MG tablet Take 1 tablet (10 mg total) by mouth 2 (two) times daily as needed for muscle spasms. 12/18/15   Samantha Tripp Dowless, PA-C  fexofenadine-pseudoephedrine (ALLEGRA-D) 60-120 MG 12 hr tablet Take 1 tablet by mouth every 12 (twelve) hours. 11/18/15   Ivery QualeHobson Bryant, PA-C  fluconazole (DIFLUCAN) 150 MG tablet Take 1 tablet (150 mg total) by mouth daily. 03/01/16   Duane LopeJennifer I Rasch, NP  gabapentin (NEURONTIN) 300 MG capsule Take 300 mg by mouth at bedtime.    Historical Provider, MD  metroNIDAZOLE (FLAGYL) 500 MG tablet Take 1 tablet (500 mg total) by mouth 2 (two) times daily. 03/01/16   Duane LopeJennifer I Rasch, NP  naproxen (NAPROSYN) 500 MG tablet Take 1 tablet (500 mg total) by mouth 2 (two) times daily with a meal. 10/15/15   Marianny Goris, PA-C    Family History Family History  Problem Relation Age of Onset  . Hypertension Mother     Social History Social History  Substance Use Topics  .  Smoking status: Former Smoker    Years: 3.00    Types: Cigarettes    Quit date: 09/05/2007  . Smokeless tobacco: Never Used  . Alcohol use No     Comment: rare     Allergies   Morphine and related and Codeine   Review of Systems Review of Systems  Constitutional: Negative for fever.  Respiratory: Negative for shortness of breath.   Gastrointestinal: Negative for abdominal pain, constipation and vomiting.  Genitourinary: Negative for decreased urine volume, difficulty urinating, dysuria, flank pain and hematuria.  Musculoskeletal: Positive for back pain. Negative for joint swelling.  Skin: Negative for rash.  Neurological: Negative for weakness and numbness.  All other systems reviewed and are negative.    Physical Exam Updated Vital  Signs BP 133/86 (BP Location: Left Arm)   Pulse 97   Temp 98.7 F (37.1 C) (Oral)   Resp 18   Ht 5\' 8"  (1.727 m)   Wt 97.5 kg   LMP 01/30/2016   SpO2 100%   BMI 32.69 kg/m   Physical Exam  Constitutional: She is oriented to person, place, and time. She appears well-developed and well-nourished. No distress.  HENT:  Head: Normocephalic and atraumatic.  Neck: Normal range of motion. Neck supple.  Cardiovascular: Normal rate, regular rhythm and intact distal pulses.   No murmur heard. Pulmonary/Chest: Effort normal and breath sounds normal. No respiratory distress.  Abdominal: Soft. She exhibits no distension. There is no tenderness.  Musculoskeletal: She exhibits tenderness. She exhibits no edema.       Lumbar back: She exhibits tenderness and pain. She exhibits normal range of motion, no swelling, no deformity, no laceration and normal pulse.  ttp of the lower lumbar spine and paraspinal muscles.    DP pulses are brisk and symmetrical.  Distal sensation intact.  Positive SLR on right.  No foot drop  Neurological: She is alert and oriented to person, place, and time. She has normal strength. No sensory deficit. She exhibits normal muscle tone. Coordination and gait normal.  Reflex Scores:      Patellar reflexes are 2+ on the right side and 2+ on the left side.      Achilles reflexes are 2+ on the right side and 2+ on the left side. Skin: Skin is warm and dry. No rash noted.  Nursing note and vitals reviewed.    ED Treatments / Results  Labs (all labs ordered are listed, but only abnormal results are displayed) Labs Reviewed - No data to display  EKG  EKG Interpretation None       Radiology No results found.  Procedures Procedures (including critical care time)  Medications Ordered in ED Medications  ketorolac (TORADOL) injection 60 mg (not administered)  diazepam (VALIUM) tablet 5 mg (not administered)     Initial Impression / Assessment and Plan / ED Course    I have reviewed the triage vital signs and the nursing notes.  Pertinent labs & imaging results that were available during my care of the patient were reviewed by me and considered in my medical decision making (see chart for details).  Clinical Course   Pt is well appearing.  Ambulates with slightly antalgic gait.  Hx of chronic low pain back pain.  Has appt with her neurosurgeon on Monday.  Doubt spinal abscess or emergent neurological process.    Feeling better after medications and appears stable for d/c.  Strict return precautions given.     Final Clinical Impressions(s) / ED Diagnoses  Final diagnoses:  Chronic back pain    New Prescriptions New Prescriptions   No medications on file     Rosey Bath 04/01/16 2248    Marily Memos, MD 04/03/16 1155

## 2016-03-31 NOTE — Discharge Instructions (Signed)
Apply ice packs on/off to your back.  Continue taking your gabapentin and flexeril as directed.  Follow-up with Dr. Channing Muttersoy on Monday.  Return here for any worsening symptoms such as fever, chills, increasing pain or worsening numbness.

## 2016-04-06 ENCOUNTER — Other Ambulatory Visit: Payer: Self-pay | Admitting: Neurosurgery

## 2016-04-06 DIAGNOSIS — M47816 Spondylosis without myelopathy or radiculopathy, lumbar region: Secondary | ICD-10-CM

## 2016-04-20 DIAGNOSIS — M791 Myalgia: Secondary | ICD-10-CM | POA: Diagnosis not present

## 2016-04-20 DIAGNOSIS — M47816 Spondylosis without myelopathy or radiculopathy, lumbar region: Secondary | ICD-10-CM | POA: Diagnosis not present

## 2016-04-20 DIAGNOSIS — M461 Sacroiliitis, not elsewhere classified: Secondary | ICD-10-CM | POA: Diagnosis not present

## 2016-04-20 DIAGNOSIS — M5136 Other intervertebral disc degeneration, lumbar region: Secondary | ICD-10-CM | POA: Diagnosis not present

## 2016-04-20 DIAGNOSIS — G5701 Lesion of sciatic nerve, right lower limb: Secondary | ICD-10-CM | POA: Diagnosis not present

## 2016-04-20 DIAGNOSIS — M5416 Radiculopathy, lumbar region: Secondary | ICD-10-CM | POA: Diagnosis not present

## 2016-05-02 ENCOUNTER — Encounter (HOSPITAL_COMMUNITY): Payer: Self-pay | Admitting: Emergency Medicine

## 2016-05-02 ENCOUNTER — Other Ambulatory Visit: Payer: Self-pay

## 2016-05-02 ENCOUNTER — Emergency Department (HOSPITAL_COMMUNITY)
Admission: EM | Admit: 2016-05-02 | Discharge: 2016-05-03 | Disposition: A | Discharge status: Home / Self Care | Payer: Medicaid Other | Attending: Emergency Medicine | Admitting: Emergency Medicine

## 2016-05-02 DIAGNOSIS — R000 Tachycardia, unspecified: Secondary | ICD-10-CM | POA: Diagnosis not present

## 2016-05-02 DIAGNOSIS — Z79899 Other long term (current) drug therapy: Secondary | ICD-10-CM | POA: Insufficient documentation

## 2016-05-02 DIAGNOSIS — M549 Dorsalgia, unspecified: Secondary | ICD-10-CM | POA: Insufficient documentation

## 2016-05-02 DIAGNOSIS — R51 Headache: Secondary | ICD-10-CM | POA: Insufficient documentation

## 2016-05-02 DIAGNOSIS — R102 Pelvic and perineal pain: Secondary | ICD-10-CM | POA: Diagnosis not present

## 2016-05-02 DIAGNOSIS — E876 Hypokalemia: Secondary | ICD-10-CM

## 2016-05-02 DIAGNOSIS — Z87891 Personal history of nicotine dependence: Secondary | ICD-10-CM | POA: Insufficient documentation

## 2016-05-02 DIAGNOSIS — R002 Palpitations: Secondary | ICD-10-CM | POA: Diagnosis present

## 2016-05-02 NOTE — ED Provider Notes (Signed)
[facility] Provider Note   CSN: [id number] Arrival date & time: 05/02/16  2217  By signing my name below, I, Alyssa GroveMartin Green, attest that this documentation has been prepared under the direction and in the presence of Dione Boozeavid Makel Mcmann, MD. Electronically Signed: Alyssa GroveMartin Green, ED Scribe. 05/02/16. 12:05 AM.   History   Chief Complaint Chief Complaint  Patient presents with  . Palpitations   The history is provided by the patient. No language interpreter was used.   HPI Comments: Ana Pace is a 35 y.o. female with PMHx of GERD who presents to the Emergency Department complaining of gradual onset, constant heart palpitations onset this evening. She stetes she started feeling dizzy and lightheaded. She went home and took Percocet, Flexeril, and Zofran, took a shower and went to lay down, but still did not feel well. She still feels as if her heart is racing and nauseous. Pt also reports decreased appetite and headache. Pt has chronic back pain that she manages with Percocet and injections. She denies fever, chills, diaphoresis, vomiting, syncope.  Past Medical History:  Diagnosis Date  . Abnormal Pap smear   . Endometrial hyperplasia without atypia, simple   . Endometriosis   . GERD (gastroesophageal reflux disease)    tums prn  . Kidney stone   . Ovarian cyst     Patient Active Problem List   Diagnosis Date Noted  . Endometriosis 04/02/2015  . Cesarean delivery, without mention of indication, delivered, with or without mention of antepartum condition 04/02/2012    Past Surgical History:  Procedure Laterality Date  . CESAREAN SECTION     x3  . CESAREAN SECTION  04/02/2012   Procedure: CESAREAN SECTION;  Surgeon: Antionette CharLisa Jackson-Moore, MD;  Location: [facility];  Service: Obstetrics;  Laterality: N/A;  Repeat cesarean section with delivery of baby girl at [id number].  . CHOLECYSTECTOMY    . DIAGNOSTIC LAPAROSCOPY     endometriosis  . LITHOTRIPSY      OB History    Gravida Para  Term Preterm AB Living   3 3 1     3    SAB TAB Ectopic Multiple Live Births           1       Home Medications    Prior to Admission medications   Medication Sig Start Date End Date Taking? Authorizing Provider  cyclobenzaprine (FLEXERIL) 10 MG tablet Take 1 tablet (10 mg total) by mouth 2 (two) times daily as needed for muscle spasms. 12/18/15  Yes Samantha Tripp Dowless, PA-C  ondansetron (ZOFRAN) 4 MG tablet Take 1 tablet (4 mg total) by mouth every 6 (six) hours. 03/31/16  Yes Tammy Triplett, PA-C  oxyCODONE-acetaminophen (PERCOCET/ROXICET) 5-325 MG tablet Take 1 tablet by mouth every 4 (four) hours as needed. Patient taking differently: Take 1 tablet by mouth every 4 (four) hours as needed for moderate pain or severe pain.  03/31/16  Yes Tammy Triplett, PA-C    Family History Family History  Problem Relation Age of Onset  . Hypertension Mother     Social History Social History  Substance Use Topics  . Smoking status: Former Smoker    Years: 3.00    Types: Cigarettes    Quit date: 09/05/2007  . Smokeless tobacco: Never Used  . Alcohol use No     Comment: rare     Allergies   Morphine and related and Codeine   Review of Systems Review of Systems  Constitutional: Positive for appetite change. Negative for chills,  diaphoresis and fever.  Cardiovascular: Positive for palpitations.  Gastrointestinal: Positive for nausea. Negative for vomiting.  Musculoskeletal: Positive for back pain.  Neurological: Positive for dizziness, light-headedness and headaches. Negative for syncope.  All other systems reviewed and are negative.   Physical Exam Updated Vital Signs BP 115/81   Pulse 109   Temp 98.5 F (36.9 C) (Oral)   Resp 16   LMP 04/18/2016   SpO2 100%   Physical Exam  Constitutional: She is oriented to person, place, and time. She appears well-developed and well-nourished.  HENT:  Head: Normocephalic and atraumatic.  Eyes: EOM are normal. Pupils are equal, round,  and reactive to light.  Neck: Normal range of motion. Neck supple. No JVD present.  Cardiovascular: Regular rhythm and normal heart sounds.  Tachycardia present.   No murmur heard. Pulmonary/Chest: Effort normal and breath sounds normal. She has no wheezes. She has no rales. She exhibits no tenderness.  Abdominal: Soft. Bowel sounds are normal. She exhibits no distension and no mass. There is no tenderness.  Musculoskeletal: Normal range of motion. She exhibits no edema.  Lymphadenopathy:    She has no cervical adenopathy.  Neurological: She is alert and oriented to person, place, and time. No cranial nerve deficit. She exhibits normal muscle tone. Coordination normal.  Skin: Skin is warm and dry. No rash noted.  Psychiatric: She has a normal mood and affect. Her behavior is normal. Judgment and thought content normal.  Nursing note and vitals reviewed.   ED Treatments / Results  DIAGNOSTIC STUDIES: Oxygen Saturation is 100% on RA, normal by my interpretation.    COORDINATION OF CARE: 11:58 PM Discussed treatment plan with pt at bedside which includes IV Fluids and lab work and pt agreed to plan.  Labs (all labs ordered are listed, but only abnormal results are displayed) Labs Reviewed  COMPREHENSIVE METABOLIC PANEL - Abnormal; Notable for the following:       Result Value   Potassium 3.0 (*)    Calcium 8.4 (*)    Anion gap 2 (*)    All other components within normal limits  CBC WITH DIFFERENTIAL/PLATELET - Abnormal; Notable for the following:    WBC 11.3 (*)    Neutro Abs 7.9 (*)    All other components within normal limits  URINALYSIS, ROUTINE W REFLEX MICROSCOPIC (NOT AT [facility]) - Abnormal; Notable for the following:    Color, Urine STRAW (*)    Specific Gravity, Urine <1.005 (*)    Hgb urine dipstick TRACE (*)    Ketones, ur 15 (*)    All other components within normal limits  URINE MICROSCOPIC-ADD ON - Abnormal; Notable for the following:    Squamous Epithelial / LPF 0-5  (*)    Bacteria, UA FEW (*)    All other components within normal limits  I-STAT CG4 LACTIC ACID, ED  I-STAT BETA HCG BLOOD, ED ([facility], [facility], [facility] ONLY)    EKG  EKG Interpretation  Date/Time:  Monday May 02 2016 22:28:17 EDT Ventricular Rate:  112 PR Interval:  154 QRS Duration: 76 QT Interval:  340 QTC Calculation: 464 R Axis:   57 Text Interpretation:  Sinus tachycardia Nonspecific T wave abnormality Abnormal ECG When compared with ECG of 01/12/2015, No significant change was found Confirmed by [facility]  MD, Kortni Hasten ([id number]) on 05/02/2016 11:33:35 PM      Procedures Procedures (including critical care time)  Medications Ordered in ED Medications  0.9 %  sodium chloride infusion (1,000 mLs Intravenous New Bag/Given  05/03/16 0022)    Followed by  0.9 %  sodium chloride infusion (not administered)  potassium chloride SA (K-DUR,KLOR-CON) CR tablet 40 mEq (not administered)  ondansetron (ZOFRAN) injection 4 mg (4 mg Intravenous Given 05/03/16 0022)     Initial Impression / Assessment and Plan / ED Course  I have reviewed the triage vital signs and the nursing notes.  Pertinent labs & imaging results that were available during my care of the patient were reviewed by me and considered in my medical decision making (see chart for details).  Clinical Course  Value Comment By Time  Squamous Epithelial / LPF: (!) 0-5 (Reviewed) Dione Booze, MD 09/19 [phone]  Palpitations with sinus tachycardia on ECG and on monitor. She is given IV fluids, and also given ondansetron because of her nausea. She continued to feel queasy in spite of ondansetron and was given a dose of metoclopramide. Heart rate came down with IV hydration. Laboratory workup showed hypokalemia and she's given a dose of oral potassium. Following that, she developed some epigastric pain. She is given a GI cocktail and metoclopramide and felt much better. Since she is not having any vomiting. I feel she should be able to correct her  potassium which is a normal diet. Heart rate has been stable. She is discharged with instructions to drink plenty of fluids. Return should symptoms worsen.    I personally performed the services described in this documentation, which was scribed in my presence. The recorded information has been reviewed and is accurate.    Final Clinical Impressions(s) / ED Diagnoses   Final diagnoses:  Sinus tachycardia (HCC)  Hypokalemia    New Prescriptions New Prescriptions   No medications on file     Dione Booze, MD 05/03/16 0422

## 2016-05-02 NOTE — ED Triage Notes (Signed)
Pt states she was at work when she started feeling "funny". Pt states she felt like her heart was racing and she has dizziness with it.

## 2016-05-03 DIAGNOSIS — Z79899 Other long term (current) drug therapy: Secondary | ICD-10-CM | POA: Diagnosis not present

## 2016-05-03 DIAGNOSIS — R102 Pelvic and perineal pain: Secondary | ICD-10-CM | POA: Diagnosis not present

## 2016-05-03 DIAGNOSIS — Z87891 Personal history of nicotine dependence: Secondary | ICD-10-CM | POA: Diagnosis not present

## 2016-05-03 DIAGNOSIS — R51 Headache: Secondary | ICD-10-CM | POA: Diagnosis not present

## 2016-05-03 DIAGNOSIS — M549 Dorsalgia, unspecified: Secondary | ICD-10-CM | POA: Diagnosis not present

## 2016-05-03 DIAGNOSIS — R000 Tachycardia, unspecified: Secondary | ICD-10-CM | POA: Diagnosis not present

## 2016-05-03 DIAGNOSIS — E876 Hypokalemia: Secondary | ICD-10-CM | POA: Diagnosis not present

## 2016-05-03 LAB — CBC WITH DIFFERENTIAL/PLATELET
BASOS PCT: 0 %
Basophils Absolute: 0 10*3/uL (ref 0.0–0.1)
Eosinophils Absolute: 0.1 10*3/uL (ref 0.0–0.7)
Eosinophils Relative: 1 %
HEMATOCRIT: 36 % (ref 36.0–46.0)
HEMOGLOBIN: 12.3 g/dL (ref 12.0–15.0)
Lymphocytes Relative: 22 %
Lymphs Abs: 2.5 10*3/uL (ref 0.7–4.0)
MCH: 30.4 pg (ref 26.0–34.0)
MCHC: 34.2 g/dL (ref 30.0–36.0)
MCV: 88.9 fL (ref 78.0–100.0)
MONO ABS: 0.8 10*3/uL (ref 0.1–1.0)
MONOS PCT: 7 %
NEUTROS ABS: 7.9 10*3/uL — AB (ref 1.7–7.7)
NEUTROS PCT: 70 %
Platelets: 230 10*3/uL (ref 150–400)
RBC: 4.05 MIL/uL (ref 3.87–5.11)
RDW: 12 % (ref 11.5–15.5)
WBC: 11.3 10*3/uL — ABNORMAL HIGH (ref 4.0–10.5)

## 2016-05-03 LAB — I-STAT BETA HCG BLOOD, ED (MC, WL, AP ONLY): I-stat hCG, quantitative: <5 m[IU]/mL (ref <5)

## 2016-05-03 LAB — COMPREHENSIVE METABOLIC PANEL
ALT: 16 U/L (ref 14–54)
ANION GAP: 2 — AB (ref 5–15)
AST: 15 U/L (ref 15–41)
Albumin: 4.2 g/dL (ref 3.5–5.0)
Alkaline Phosphatase: 76 U/L (ref 38–126)
BILIRUBIN TOTAL: 0.6 mg/dL (ref 0.3–1.2)
BUN: 7 mg/dL (ref 6–20)
CALCIUM: 8.4 mg/dL — AB (ref 8.9–10.3)
CO2: 26 mmol/L (ref 22–32)
Chloride: 107 mmol/L (ref 101–111)
Creatinine, Ser: 0.92 mg/dL (ref 0.44–1.00)
Glucose, Bld: 97 mg/dL (ref 65–99)
Potassium: 3 mmol/L — ABNORMAL LOW (ref 3.5–5.1)
Sodium: 135 mmol/L (ref 135–145)
TOTAL PROTEIN: 7.1 g/dL (ref 6.5–8.1)

## 2016-05-03 LAB — URINALYSIS, ROUTINE W REFLEX MICROSCOPIC
BILIRUBIN URINE: NEGATIVE
GLUCOSE, UA: NEGATIVE mg/dL
KETONES UR: 15 mg/dL — AB
Leukocytes, UA: NEGATIVE
NITRITE: NEGATIVE
Protein, ur: NEGATIVE mg/dL
pH: 6 (ref 5.0–8.0)

## 2016-05-03 LAB — URINE MICROSCOPIC-ADD ON

## 2016-05-03 LAB — I-STAT CG4 LACTIC ACID, ED: Lactic Acid, Venous: 0.62 mmol/L (ref 0.5–1.9)

## 2016-05-03 MED ORDER — GI COCKTAIL ~~LOC~~
30.0000 mL | Freq: Once | ORAL | Status: AC
  Administered 2016-05-03: 30 mL via ORAL
  Filled 2016-05-03: qty 30

## 2016-05-03 MED ORDER — DIPHENHYDRAMINE HCL 50 MG/ML IJ SOLN
25.0000 mg | Freq: Once | INTRAMUSCULAR | Status: AC
  Administered 2016-05-03: 25 mg via INTRAVENOUS
  Filled 2016-05-03: qty 1

## 2016-05-03 MED ORDER — METOCLOPRAMIDE HCL 5 MG/ML IJ SOLN
10.0000 mg | Freq: Once | INTRAMUSCULAR | Status: DC
Start: 2016-05-03 — End: 2016-05-03
  Filled 2016-05-03: qty 2

## 2016-05-03 MED ORDER — SODIUM CHLORIDE 0.9 % IV SOLN
1000.0000 mL | Freq: Once | INTRAVENOUS | Status: AC
  Administered 2016-05-03: 1000 mL via INTRAVENOUS

## 2016-05-03 MED ORDER — ONDANSETRON HCL 4 MG/2ML IJ SOLN
4.0000 mg | Freq: Once | INTRAMUSCULAR | Status: AC
  Administered 2016-05-03: 4 mg via INTRAVENOUS
  Filled 2016-05-03: qty 2

## 2016-05-03 MED ORDER — ONDANSETRON HCL 4 MG/2ML IJ SOLN
4.0000 mg | Freq: Once | INTRAMUSCULAR | Status: AC
  Administered 2016-05-03: 4 mg via INTRAVENOUS

## 2016-05-03 MED ORDER — SODIUM CHLORIDE 0.9 % IV SOLN
1000.0000 mL | INTRAVENOUS | Status: DC
Start: 2016-05-03 — End: 2016-05-03
  Administered 2016-05-03: 1000 mL via INTRAVENOUS

## 2016-05-03 MED ORDER — POTASSIUM CHLORIDE CRYS ER 20 MEQ PO TBCR
40.0000 meq | EXTENDED_RELEASE_TABLET | Freq: Once | ORAL | Status: AC
  Administered 2016-05-03: 40 meq via ORAL
  Filled 2016-05-03: qty 2

## 2016-05-03 MED ORDER — ONDANSETRON HCL 4 MG/2ML IJ SOLN
INTRAMUSCULAR | Status: DC
Start: 2016-05-03 — End: 2016-05-03
  Filled 2016-05-03: qty 2

## 2016-05-03 NOTE — ED Notes (Signed)
Patient states that she stills feels dizzy and nausea at this time. Will make Nurse aware.

## 2016-06-07 ENCOUNTER — Telehealth (HOSPITAL_COMMUNITY): Payer: Self-pay

## 2016-06-07 ENCOUNTER — Ambulatory Visit (HOSPITAL_COMMUNITY): Payer: Medicaid Other | Admitting: Physical Therapy

## 2016-06-07 DIAGNOSIS — M461 Sacroiliitis, not elsewhere classified: Secondary | ICD-10-CM | POA: Diagnosis not present

## 2016-06-07 DIAGNOSIS — M791 Myalgia: Secondary | ICD-10-CM | POA: Diagnosis not present

## 2016-06-07 DIAGNOSIS — M47817 Spondylosis without myelopathy or radiculopathy, lumbosacral region: Secondary | ICD-10-CM | POA: Diagnosis not present

## 2016-06-07 NOTE — Telephone Encounter (Signed)
12/24 she cx because she took some pain medication and asked that we call her back to reschedule

## 2016-06-13 DIAGNOSIS — N200 Calculus of kidney: Secondary | ICD-10-CM | POA: Diagnosis not present

## 2016-06-13 DIAGNOSIS — N201 Calculus of ureter: Secondary | ICD-10-CM | POA: Diagnosis not present

## 2016-06-18 DIAGNOSIS — M5416 Radiculopathy, lumbar region: Secondary | ICD-10-CM | POA: Diagnosis not present

## 2016-06-18 DIAGNOSIS — Z79899 Other long term (current) drug therapy: Secondary | ICD-10-CM | POA: Diagnosis not present

## 2016-06-18 DIAGNOSIS — M545 Low back pain: Secondary | ICD-10-CM | POA: Diagnosis not present

## 2016-06-18 DIAGNOSIS — M549 Dorsalgia, unspecified: Secondary | ICD-10-CM | POA: Diagnosis not present

## 2016-07-21 DIAGNOSIS — G8929 Other chronic pain: Secondary | ICD-10-CM | POA: Diagnosis not present

## 2016-07-21 DIAGNOSIS — R102 Pelvic and perineal pain: Secondary | ICD-10-CM | POA: Diagnosis not present

## 2016-07-27 DIAGNOSIS — M5136 Other intervertebral disc degeneration, lumbar region: Secondary | ICD-10-CM | POA: Diagnosis not present

## 2016-07-27 DIAGNOSIS — M461 Sacroiliitis, not elsewhere classified: Secondary | ICD-10-CM | POA: Diagnosis not present

## 2016-07-27 DIAGNOSIS — M5416 Radiculopathy, lumbar region: Secondary | ICD-10-CM | POA: Diagnosis not present

## 2016-07-27 DIAGNOSIS — M47816 Spondylosis without myelopathy or radiculopathy, lumbar region: Secondary | ICD-10-CM | POA: Diagnosis not present

## 2016-08-08 DIAGNOSIS — G8929 Other chronic pain: Secondary | ICD-10-CM | POA: Diagnosis not present

## 2016-08-08 DIAGNOSIS — M5441 Lumbago with sciatica, right side: Secondary | ICD-10-CM | POA: Diagnosis not present

## 2016-08-09 DIAGNOSIS — M5441 Lumbago with sciatica, right side: Secondary | ICD-10-CM | POA: Diagnosis not present

## 2016-08-09 DIAGNOSIS — Z79899 Other long term (current) drug therapy: Secondary | ICD-10-CM | POA: Diagnosis not present

## 2016-08-09 DIAGNOSIS — Z87442 Personal history of urinary calculi: Secondary | ICD-10-CM | POA: Diagnosis not present

## 2016-08-09 DIAGNOSIS — M545 Low back pain: Secondary | ICD-10-CM | POA: Diagnosis not present

## 2016-08-09 DIAGNOSIS — G8929 Other chronic pain: Secondary | ICD-10-CM | POA: Diagnosis not present

## 2016-08-16 ENCOUNTER — Emergency Department (HOSPITAL_COMMUNITY)
Admission: EM | Admit: 2016-08-16 | Discharge: 2016-08-17 | Disposition: A | Discharge status: Home / Self Care | Payer: 59 | Attending: Emergency Medicine | Admitting: Emergency Medicine

## 2016-08-16 ENCOUNTER — Encounter (HOSPITAL_COMMUNITY): Payer: Self-pay | Admitting: Emergency Medicine

## 2016-08-16 DIAGNOSIS — X58XXXA Exposure to other specified factors, initial encounter: Secondary | ICD-10-CM | POA: Insufficient documentation

## 2016-08-16 DIAGNOSIS — Y929 Unspecified place or not applicable: Secondary | ICD-10-CM | POA: Insufficient documentation

## 2016-08-16 DIAGNOSIS — S39012A Strain of muscle, fascia and tendon of lower back, initial encounter: Secondary | ICD-10-CM | POA: Insufficient documentation

## 2016-08-16 DIAGNOSIS — Y999 Unspecified external cause status: Secondary | ICD-10-CM | POA: Insufficient documentation

## 2016-08-16 DIAGNOSIS — M5136 Other intervertebral disc degeneration, lumbar region: Secondary | ICD-10-CM | POA: Insufficient documentation

## 2016-08-16 DIAGNOSIS — Y939 Activity, unspecified: Secondary | ICD-10-CM | POA: Diagnosis not present

## 2016-08-16 DIAGNOSIS — Z87891 Personal history of nicotine dependence: Secondary | ICD-10-CM | POA: Insufficient documentation

## 2016-08-16 DIAGNOSIS — S3992XA Unspecified injury of lower back, initial encounter: Secondary | ICD-10-CM | POA: Diagnosis present

## 2016-08-16 NOTE — ED Triage Notes (Signed)
Pt c/o lower back pain with spasms since earlier today.

## 2016-08-17 DIAGNOSIS — S39012A Strain of muscle, fascia and tendon of lower back, initial encounter: Secondary | ICD-10-CM | POA: Diagnosis not present

## 2016-08-17 DIAGNOSIS — M5136 Other intervertebral disc degeneration, lumbar region: Secondary | ICD-10-CM | POA: Diagnosis not present

## 2016-08-17 DIAGNOSIS — Z87891 Personal history of nicotine dependence: Secondary | ICD-10-CM | POA: Diagnosis not present

## 2016-08-17 DIAGNOSIS — M47816 Spondylosis without myelopathy or radiculopathy, lumbar region: Secondary | ICD-10-CM | POA: Diagnosis not present

## 2016-08-17 MED ORDER — DIAZEPAM 5 MG PO TABS
5.0000 mg | ORAL_TABLET | Freq: Once | ORAL | Status: AC
  Administered 2016-08-17: 5 mg via ORAL
  Filled 2016-08-17: qty 1

## 2016-08-17 MED ORDER — ONDANSETRON HCL 4 MG/2ML IJ SOLN
4.0000 mg | Freq: Once | INTRAMUSCULAR | Status: AC
  Administered 2016-08-17: 4 mg via INTRAVENOUS
  Filled 2016-08-17: qty 2

## 2016-08-17 MED ORDER — HYDROMORPHONE HCL 1 MG/ML IJ SOLN
1.0000 mg | Freq: Once | INTRAMUSCULAR | Status: AC
  Administered 2016-08-17: 1 mg via INTRAVENOUS
  Filled 2016-08-17: qty 1

## 2016-08-17 MED ORDER — KETOROLAC TROMETHAMINE 30 MG/ML IJ SOLN
30.0000 mg | Freq: Once | INTRAMUSCULAR | Status: AC
  Administered 2016-08-17: 30 mg via INTRAVENOUS
  Filled 2016-08-17: qty 1

## 2016-08-17 MED ORDER — DIAZEPAM 5 MG PO TABS: ORAL_TABLET | ORAL | 0 refills | Status: DC

## 2016-08-17 NOTE — ED Provider Notes (Signed)
[facility] Provider Note   CSN: [id number] Arrival date & time: 08/16/16  2223     History   Chief Complaint Chief Complaint  Patient presents with  . Back Pain    HPI Ana Pace is a 36 y.o. female.  Patient is a 36 year old female who presents to the emergency department with complaint of back pain.  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 She does not recall a specific incident of lifting, pushing, or pulling on. However she states that she does all of those things during the course of the day doing her nursing duties. She worked on last evening usually and then during the day today. She began to have some spasm of her lower back. She attempted to rest. She took a muscle relaxer, and a narcotic pain medication, but the pain kept getting worse. It is of note that she has some degenerative disc disease changes and some stenosis present. She is being seen by a neurosurgeon for this problem. She's not had any recent fall or trauma or other injury. There's been no operations or procedures involving the lower back. There was no loss of bowel or bladder function. No numbness in the inner thigh ordered total areas. No difficulty with use of the lower extremities, other than causing pain in the back. The patient states that from time to time she has sciatica type pain, but this pain is different in that it feels like a tightening and pulling sensation. There is also some burning sensation.      Past Medical History:  Diagnosis Date  . Abnormal Pap smear   . Endometrial hyperplasia without atypia, simple   . Endometriosis   . GERD (gastroesophageal reflux disease)    tums prn  . Kidney stone   . Ovarian cyst     Patient Active Problem List   Diagnosis Date Noted  . Endometriosis 04/02/2015  . Cesarean delivery, without mention of indication, delivered, with or without mention of antepartum condition 04/02/2012     Past Surgical History:  Procedure Laterality Date  . CESAREAN SECTION     x3  . CESAREAN SECTION  04/02/2012   Procedure: CESAREAN SECTION;  Surgeon: Antionette Char, MD;  Location: [facility];  Service: Obstetrics;  Laterality: N/A;  Repeat cesarean section with delivery of baby girl at 49.  . CHOLECYSTECTOMY    . DIAGNOSTIC LAPAROSCOPY     endometriosis  . LITHOTRIPSY      OB History    Gravida Para Term Preterm AB Living   3 3 1     3    SAB TAB Ectopic Multiple Live Births           1       Home Medications    Prior to Admission medications   Medication Sig Start Date End Date Taking? Authorizing Provider  cyclobenzaprine (FLEXERIL) 10 MG tablet Take 1 tablet (10 mg total) by mouth 2 (two) times daily as needed for muscle spasms. 12/18/15   Samantha Tripp Dowless, PA-C  ondansetron (ZOFRAN) 4 MG tablet Take 1 tablet (4 mg total) by mouth every 6 (six) hours. 03/31/16   Tammy Triplett, PA-C  oxyCODONE-acetaminophen (PERCOCET/ROXICET) 5-325 MG tablet Take 1 tablet by mouth every 4 (four) hours as needed. Patient taking differently: Take 1 tablet by mouth every 4 (four) hours as needed for moderate pain or severe pain.  03/31/16   Tammy Triplett, PA-C  Family History Family History  Problem Relation Age of Onset  . Hypertension Mother     Social History Social History  Substance Use Topics  . Smoking status: Former Smoker    Years: 3.00    Types: Cigarettes    Quit date: 09/05/2007  . Smokeless tobacco: Never Used  . Alcohol use No     Comment: rare     Allergies   Morphine and related and Codeine   Review of Systems Review of Systems  Constitutional: Negative for activity change.       All ROS Neg except as noted in HPI  HENT: Negative for nosebleeds.   Eyes: Negative for photophobia and discharge.  Respiratory: Negative for cough, shortness of breath and wheezing.   Cardiovascular: Negative for chest pain and palpitations.  Gastrointestinal: Negative  for abdominal pain and blood in stool.  Genitourinary: Negative for dysuria, frequency and hematuria.  Musculoskeletal: Positive for back pain. Negative for arthralgias and neck pain.  Skin: Negative.   Neurological: Negative for dizziness, seizures and speech difficulty.  Psychiatric/Behavioral: Negative for confusion and hallucinations.     Physical Exam Updated Vital Signs BP 135/97 (BP Location: Left Arm)   Pulse 120   Temp 97.7 F (36.5 C) (Oral)   Resp 20   Ht 5\' 8"  (1.727 m)   Wt 95.3 kg   LMP 08/02/2016   SpO2 100%   BMI 31.93 kg/m   Physical Exam  Constitutional: She is oriented to person, place, and time. She appears well-developed and well-nourished.  Non-toxic appearance.  HENT:  Head: Normocephalic.  Right Ear: Tympanic membrane and external ear normal.  Left Ear: Tympanic membrane and external ear normal.  Eyes: EOM and lids are normal. Pupils are equal, round, and reactive to light.  Neck: Normal range of motion. Neck supple. Carotid bruit is not present.  Cardiovascular: Regular rhythm, normal heart sounds, intact distal pulses and normal pulses.  Tachycardia present.   Pulmonary/Chest: Breath sounds normal. No respiratory distress.  Abdominal: Soft. Bowel sounds are normal. There is no tenderness. There is no guarding.  Musculoskeletal: Normal range of motion.  There is right paraspinal tenderness in the lumbar region with extension to the buttocks. There is pain with change of position, or attempted range of motion. There no hot areas appreciated. And there is no palpable step off of the thoracic or the lumbar spine.  Lymphadenopathy:       Head (right side): No submandibular adenopathy present.       Head (left side): No submandibular adenopathy present.    She has no cervical adenopathy.  Neurological: She is alert and oriented to person, place, and time. She has normal strength. No cranial nerve deficit or sensory deficit.  No gross motor or sensory  deficit appreciated.  Skin: Skin is warm and dry.  Psychiatric: She has a normal mood and affect. Her speech is normal.  Nursing note and vitals reviewed.    ED Treatments / Results  Labs (all labs ordered are listed, but only abnormal results are displayed) Labs Reviewed - No data to display  EKG  EKG Interpretation None       Radiology No results found.  Procedures Procedures (including critical care time)  Medications Ordered in ED Medications  HYDROmorphone (DILAUDID) injection 1 mg (1 mg Intravenous Given 08/17/16 0039)  ondansetron (ZOFRAN) injection 4 mg (4 mg Intravenous Given 08/17/16 0039)  diazepam (VALIUM) tablet 5 mg (5 mg Oral Given 08/17/16 0037)  ketorolac (TORADOL) 30 MG/ML  injection 30 mg (30 mg Intravenous Given 08/17/16 0039)     Initial Impression / Assessment and Plan / ED Course  I have reviewed the triage vital signs and the nursing notes.  Pertinent labs & imaging results that were available during my care of the patient were reviewed by me and considered in my medical decision making (see chart for details).  Clinical Course     *I have reviewed nursing notes, vital signs, and all appropriate lab and imaging results for this patient.**  Final Clinical Impressions(s) / ED Diagnoses  No gross neurologic deficits appreciated. Patient feeling better after IV pain medication and oral muscle relaxing medication. The patient will be prescribed additional muscle relaxing medication. She has pain medication at home. Also prescribed an anti-inflammatory medication. Patient advised to use a heating pad to the area and to rest her back is much as possible.    Final diagnoses:  None    New Prescriptions New Prescriptions   No medications on file     Ivery QualeHobson Marcoantonio Legault, PA-C 08/17/16 0133    Zadie Rhineonald Wickline, MD 08/17/16 [phone]

## 2016-08-17 NOTE — Discharge Instructions (Signed)
Please rest your back is much as possible. Heating pad will be helpful. Please stop the Flexeril for now. Use Valium 1 tablet 3 times a day. May continue your current pain medication. Value may cause drowsiness. Please do not drink alcohol, drive a vehicle, operate machinery, or participate in activities requiring concentration when taking this medication. Please see her primary physician, or return to the emergency department if any changes, problems, or concerns.

## 2016-08-18 DIAGNOSIS — M461 Sacroiliitis, not elsewhere classified: Secondary | ICD-10-CM | POA: Diagnosis not present

## 2016-08-18 DIAGNOSIS — M47816 Spondylosis without myelopathy or radiculopathy, lumbar region: Secondary | ICD-10-CM | POA: Diagnosis not present

## 2016-08-18 DIAGNOSIS — M5416 Radiculopathy, lumbar region: Secondary | ICD-10-CM | POA: Diagnosis not present

## 2016-08-18 DIAGNOSIS — M5136 Other intervertebral disc degeneration, lumbar region: Secondary | ICD-10-CM | POA: Diagnosis not present

## 2016-08-18 DIAGNOSIS — Z79891 Long term (current) use of opiate analgesic: Secondary | ICD-10-CM | POA: Diagnosis not present

## 2016-08-18 DIAGNOSIS — M791 Myalgia: Secondary | ICD-10-CM | POA: Diagnosis not present

## 2016-08-23 DIAGNOSIS — M47817 Spondylosis without myelopathy or radiculopathy, lumbosacral region: Secondary | ICD-10-CM | POA: Diagnosis not present

## 2016-08-23 DIAGNOSIS — M461 Sacroiliitis, not elsewhere classified: Secondary | ICD-10-CM | POA: Diagnosis not present

## 2016-09-23 ENCOUNTER — Emergency Department (HOSPITAL_COMMUNITY)
Admission: EM | Admit: 2016-09-23 | Discharge: 2016-09-23 | Disposition: A | Discharge status: Home / Self Care | Payer: 59 | Attending: Emergency Medicine | Admitting: Emergency Medicine

## 2016-09-23 ENCOUNTER — Encounter (HOSPITAL_COMMUNITY): Payer: Self-pay | Admitting: *Deleted

## 2016-09-23 DIAGNOSIS — M545 Low back pain: Secondary | ICD-10-CM | POA: Diagnosis present

## 2016-09-23 DIAGNOSIS — M62830 Muscle spasm of back: Secondary | ICD-10-CM

## 2016-09-23 DIAGNOSIS — M5431 Sciatica, right side: Secondary | ICD-10-CM

## 2016-09-23 DIAGNOSIS — M62838 Other muscle spasm: Secondary | ICD-10-CM | POA: Diagnosis not present

## 2016-09-23 DIAGNOSIS — Z87891 Personal history of nicotine dependence: Secondary | ICD-10-CM | POA: Diagnosis not present

## 2016-09-23 DIAGNOSIS — M5441 Lumbago with sciatica, right side: Secondary | ICD-10-CM | POA: Insufficient documentation

## 2016-09-23 MED ORDER — HYDROMORPHONE HCL 1 MG/ML IJ SOLN
0.5000 mg | Freq: Once | INTRAMUSCULAR | Status: AC
  Administered 2016-09-23: 0.5 mg via INTRAMUSCULAR
  Filled 2016-09-23: qty 1

## 2016-09-23 MED ORDER — DEXAMETHASONE SODIUM PHOSPHATE 4 MG/ML IJ SOLN
10.0000 mg | Freq: Once | INTRAMUSCULAR | Status: AC
  Administered 2016-09-23: 10 mg via INTRAVENOUS
  Filled 2016-09-23: qty 3

## 2016-09-23 MED ORDER — DIAZEPAM 5 MG PO TABS
5.0000 mg | ORAL_TABLET | Freq: Once | ORAL | Status: AC
  Administered 2016-09-23: 5 mg via ORAL
  Filled 2016-09-23: qty 1

## 2016-09-23 MED ORDER — DIAZEPAM 5 MG PO TABS: 5.0000 mg | ORAL_TABLET | Freq: Three times a day (TID) | ORAL | 0 refills | Status: DC | PRN

## 2016-09-23 MED ORDER — KETOROLAC TROMETHAMINE 30 MG/ML IJ SOLN
30.0000 mg | Freq: Once | INTRAMUSCULAR | Status: AC
Start: 2016-09-23 — End: 2016-09-23
  Administered 2016-09-23: 30 mg via INTRAMUSCULAR
  Filled 2016-09-23: qty 1

## 2016-09-23 MED ORDER — HYDROMORPHONE HCL 1 MG/ML IJ SOLN
0.5000 mg | Freq: Once | INTRAMUSCULAR | Status: AC
  Administered 2016-09-23: 0.5 mg via INTRAVENOUS
  Filled 2016-09-23: qty 1

## 2016-09-23 NOTE — ED Triage Notes (Signed)
Pt reports being in bed tonight and she rolled over and felt the right side, lower back and that radiates down the right leg. Pt is unable to sit. Pt has history of back problems.

## 2016-09-23 NOTE — ED Provider Notes (Signed)
[facility]PT Provider Note   CSN: [id number] Arrival date & time: 09/23/16  0532     History   Chief Complaint Chief Complaint  Patient presents with  . Back Pain    HPI Ana Pace is a 36 y.o. female.  Patient presents with acute onset of pain, spasm and "locking up" of her right lower back. She has a long history of back pain similar to this but this was exacerbated after she turned a certain way in bed. Denies any fall or trauma. The pain radiates down her right buttock and right leg. It is associated with "nerve pain" but no weakness, numbness or tingling. Patient has chronic pain and took oxycodone, Flexeril and tumeric last night. She did not take anything this morning. No previous back surgeries. Last MRI was May 2017 which showed a bulging disc. Denies any bowel or bladder incontinence. Denies any fever or vomiting. Denies any pain with urination or hematuria. Denies any chest pain or shortness of breath.   The history is provided by the patient and a significant other.  Back Pain   Pertinent negatives include no fever, no numbness, no headaches, no abdominal pain, no dysuria and no weakness.    Past Medical History:  Diagnosis Date  . Abnormal Pap smear   . Endometrial hyperplasia without atypia, simple   . Endometriosis   . GERD (gastroesophageal reflux disease)    tums prn  . Kidney stone   . Ovarian cyst     Patient Active Problem List   Diagnosis Date Noted  . Endometriosis 04/02/2015  . Cesarean delivery, without mention of indication, delivered, with or without mention of antepartum condition 04/02/2012    Past Surgical History:  Procedure Laterality Date  . CESAREAN SECTION     x3  . CESAREAN SECTION  04/02/2012   Procedure: CESAREAN SECTION;  Surgeon: Antionette CharLisa Jackson-Moore, MD;  Location: [facility];  Service: Obstetrics;  Laterality: N/A;  Repeat cesarean section with delivery of baby girl at [id number].  . CHOLECYSTECTOMY    . DIAGNOSTIC LAPAROSCOPY      endometriosis  . LITHOTRIPSY      OB History    Gravida Para Term Preterm AB Living   3 3 1     3    SAB TAB Ectopic Multiple Live Births           1       Home Medications    Prior to Admission medications   Medication Sig Start Date End Date Taking? Authorizing Provider  cyclobenzaprine (FLEXERIL) 10 MG tablet Take 1 tablet (10 mg total) by mouth 2 (two) times daily as needed for muscle spasms. 12/18/15  Yes Samantha Tripp Dowless, PA-C  diclofenac (VOLTAREN) 25 MG EC tablet Take 25 mg by mouth 2 (two) times daily.   Yes Historical Provider, MD  oxyCODONE-acetaminophen (PERCOCET/ROXICET) 5-325 MG tablet Take 1 tablet by mouth every 4 (four) hours as needed. Patient taking differently: Take 1 tablet by mouth every 4 (four) hours as needed for moderate pain or severe pain.  03/31/16  Yes Tammy Triplett, PA-C    Family History Family History  Problem Relation Age of Onset  . Hypertension Mother     Social History Social History  Substance Use Topics  . Smoking status: Former Smoker    Years: 3.00    Types: Cigarettes    Quit date: 09/05/2007  . Smokeless tobacco: Never Used  . Alcohol use No     Comment: rare  Allergies   Morphine and related and Codeine   Review of Systems Review of Systems  Constitutional: Negative for activity change, appetite change and fever.  HENT: Negative for congestion and rhinorrhea.   Respiratory: Negative for cough, chest tightness and shortness of breath.   Gastrointestinal: Negative for abdominal pain, nausea and vomiting.  Genitourinary: Negative for dysuria and hematuria.  Musculoskeletal: Positive for back pain. Negative for arthralgias and myalgias.  Skin: Negative for rash.  Neurological: Negative for dizziness, speech difficulty, weakness, numbness and headaches.   A complete 10 system review of systems was obtained and all systems are negative except as noted in the HPI and PMH.    Physical Exam Updated Vital  Signs BP 101/65 (BP Location: Left Arm)   Pulse 112   Temp 97.7 F (36.5 C) (Oral)   Resp 22   LMP 08/28/2016   SpO2 94%   Physical Exam  Constitutional: She is oriented to person, place, and time. She appears well-developed and well-nourished. No distress.  Uncomfortable Leaning to left on stretcher.   HENT:  Head: Normocephalic and atraumatic.  Mouth/Throat: Oropharynx is clear and moist. No oropharyngeal exudate.  Eyes: Conjunctivae and EOM are normal. Pupils are equal, round, and reactive to light.  Neck: Normal range of motion. Neck supple.  No meningismus.  Cardiovascular: Normal rate, regular rhythm, normal heart sounds and intact distal pulses.   No murmur heard. Pulmonary/Chest: Effort normal and breath sounds normal. No respiratory distress.  Abdominal: Soft. There is no tenderness. There is no rebound and no guarding.  Musculoskeletal: Normal range of motion. She exhibits tenderness. She exhibits no edema.  R paraspinal lumbar tenderness.  5/5 strength in bilateral lower extremities. Ankle plantar and dorsiflexion intact. Great toe extension intact bilaterally. +2 DP and PT pulses. +2 patellar reflexes bilaterally.    Neurological: She is alert and oriented to person, place, and time. No cranial nerve deficit. She exhibits normal muscle tone. Coordination normal.   5/5 strength throughout. CN 2-12 intact.Equal grip strength.   Skin: Skin is warm.  Psychiatric: She has a normal mood and affect. Her behavior is normal.  Nursing note and vitals reviewed.    ED Treatments / Results  Labs (all labs ordered are listed, but only abnormal results are displayed) Labs Reviewed - No data to display  EKG  EKG Interpretation None       Radiology No results found.  Procedures Procedures (including critical care time)  Medications Ordered in ED Medications  HYDROmorphone (DILAUDID) injection 0.5 mg (not administered)  ketorolac (TORADOL) 30 MG/ML injection 30 mg  (not administered)  diazepam (VALIUM) tablet 5 mg (not administered)     Initial Impression / Assessment and Plan / ED Course  I have reviewed the triage vital signs and the nursing notes.  Pertinent labs & imaging results that were available during my care of the patient were reviewed by me and considered in my medical decision making (see chart for details).     Acute on chronic back pain.  No weakness, numbness, tingling.  No bowel or bladder incontinence. No fever or vomiting. No dysuria or hematuria. Denies pregnancy.  Will treat with pain control and antiinflammatories.  Doubt cord compression or cauda equina.  Doubt epidural abscess.   Pain improved with PO valium, IV dilaudid, toradol, decadron.  Patient able to ambulate. No motor or sensory deficits. Stable for follow up with PCP and pain specialist.  Will give short course of valium.  She has chronic pain medication  at home.  Suspect exacerbation of sciatica with possible component of lumbar strain. Return precautions discussed.  Final Clinical Impressions(s) / ED Diagnoses   Final diagnoses:  Sciatica of right side  Spasm of lumbar paraspinous muscle    New Prescriptions New Prescriptions   No medications on file     Glynn Octave, MD 09/23/16 [phone]

## 2016-09-23 NOTE — ED Notes (Signed)
Pt ambulatory with some difficulty.  States she is walking better now.   Verbalized relief of pain.

## 2016-09-23 NOTE — ED Notes (Signed)
Pt still not able to stand up straight.

## 2016-09-23 NOTE — Discharge Instructions (Signed)
Take your pain medication as prescribed and followup with your doctor.  Use the valium as needed for muscle spasm.  Do not take it if you are working or driving. Return to the ED if you develop worsening pain, weakness, numbness, incontinence or any other concerns.

## 2016-09-24 ENCOUNTER — Encounter (HOSPITAL_COMMUNITY): Payer: Self-pay | Admitting: Emergency Medicine

## 2016-09-24 ENCOUNTER — Emergency Department (HOSPITAL_COMMUNITY)
Admission: EM | Admit: 2016-09-24 | Discharge: 2016-09-25 | Disposition: A | Discharge status: Other Acute Inpatient Hospital | Payer: 59 | Attending: Emergency Medicine | Admitting: Emergency Medicine

## 2016-09-24 DIAGNOSIS — R339 Retention of urine, unspecified: Secondary | ICD-10-CM | POA: Insufficient documentation

## 2016-09-24 DIAGNOSIS — Z87891 Personal history of nicotine dependence: Secondary | ICD-10-CM | POA: Diagnosis not present

## 2016-09-24 DIAGNOSIS — M48061 Spinal stenosis, lumbar region without neurogenic claudication: Secondary | ICD-10-CM | POA: Insufficient documentation

## 2016-09-24 DIAGNOSIS — G8929 Other chronic pain: Secondary | ICD-10-CM | POA: Insufficient documentation

## 2016-09-24 DIAGNOSIS — M5126 Other intervertebral disc displacement, lumbar region: Secondary | ICD-10-CM | POA: Insufficient documentation

## 2016-09-24 DIAGNOSIS — M549 Dorsalgia, unspecified: Secondary | ICD-10-CM

## 2016-09-24 DIAGNOSIS — M5440 Lumbago with sciatica, unspecified side: Secondary | ICD-10-CM | POA: Insufficient documentation

## 2016-09-24 DIAGNOSIS — M545 Low back pain: Secondary | ICD-10-CM | POA: Diagnosis not present

## 2016-09-24 MED ORDER — DIAZEPAM 5 MG PO TABS
10.0000 mg | ORAL_TABLET | Freq: Once | ORAL | Status: AC
  Administered 2016-09-25: 10 mg via ORAL
  Filled 2016-09-24: qty 2

## 2016-09-24 MED ORDER — DEXAMETHASONE SODIUM PHOSPHATE 10 MG/ML IJ SOLN
10.0000 mg | Freq: Once | INTRAMUSCULAR | Status: AC
  Administered 2016-09-25: 10 mg via INTRAVENOUS
  Filled 2016-09-24: qty 1

## 2016-09-24 MED ORDER — FENTANYL CITRATE (PF) 100 MCG/2ML IJ SOLN
50.0000 ug | Freq: Once | INTRAMUSCULAR | Status: AC
  Administered 2016-09-25: 50 ug via INTRAVENOUS
  Filled 2016-09-24: qty 2

## 2016-09-24 NOTE — ED Notes (Signed)
Pt voided but was not able to catch urine. Pt feels as though it was a lot.

## 2016-09-24 NOTE — ED Notes (Signed)
Pt stated that her rt hip has been "popping".

## 2016-09-24 NOTE — ED Notes (Signed)
Bladder scan : 588 mL

## 2016-09-24 NOTE — ED Provider Notes (Signed)
[facility] Provider Note   CSN: [id number] Arrival date & time: 09/24/16  1805    By signing my name below, I, Valentino Saxon, attest that this documentation has been prepared under the direction and in the presence of Devoria Albe, MD. Electronically Signed: Valentino Saxon, ED Scribe. 09/24/16. 1:13 AM.  Time seen 23:37 AM  History   Chief Complaint Chief Complaint  Patient presents with  . Back Pain   The history is provided by the patient. No language interpreter was used.   HPI Comments: Ana Pace is a 36 y.o. female with PMHx of spondylosis and scoliosis who presents to the Emergency Department complaining of moderate, constant, lower back pain onset a couple of days ago. Pt denies recent trauma, injury, heavy lifting, falls, twisting, bending. Pt was last seen in ED yesterday for similar symptoms. Pt was treated with pain control With oxycodone, Flexeril, and anti-inflammatories, will tear in. She notes pain was improved with valium, dilaudid, Toradol and decadron. Pt states her pain tonight is different from her normal baseline. She reports having pain that radiates down her right leg to her right knee instead of stopping at her hip. Pt notes associated weakness and numbness in her right knee. Pt describes her pain as a burning sensation. She states pain is worsened with exertion and bearing weight. She is ambulatory with minimal difficulty. She is currently followed by Dr. Channing Mutters, last seen on 08/17/16 for back pain. She notes being administered an injection for her back pain on 01/09 with minimal relief. Pt reports having 3 rounds of injections that last about 6 weeks. Per pt, Dr. Channing Mutters is holding off fusion surgery until she turns 40. Per pt, she notes her pain is located on disk 4 and 5, but states disk 5 is beginning to fuse on her sacral joint. Pt also reports trouble urinating that started today.. She describes having a pressure-like sensation when using the restroom  and feels like she has to push to get the urine to start.. She denies bowel or bladder incontinence, saddle anesthesia, fever, cough, abdominal pain, nausea, emesis,  hematuria, frequency, rash.    PCP is Therapist, sports.   Past Medical History:  Diagnosis Date  . Abnormal Pap smear   . Endometrial hyperplasia without atypia, simple   . Endometriosis   . GERD (gastroesophageal reflux disease)    tums prn  . Kidney stone   . Ovarian cyst     Patient Active Problem List   Diagnosis Date Noted  . Endometriosis 04/02/2015  . Cesarean delivery, without mention of indication, delivered, with or without mention of antepartum condition 04/02/2012    Past Surgical History:  Procedure Laterality Date  . CESAREAN SECTION     x3  . CESAREAN SECTION  04/02/2012   Procedure: CESAREAN SECTION;  Surgeon: Antionette Char, MD;  Location: [facility];  Service: Obstetrics;  Laterality: N/A;  Repeat cesarean section with delivery of baby girl at 43.  . CHOLECYSTECTOMY    . DIAGNOSTIC LAPAROSCOPY     endometriosis  . LITHOTRIPSY      OB History    Gravida Para Term Preterm AB Living   3 3 1     3    SAB TAB Ectopic Multiple Live Births           1       Home Medications    Prior to Admission medications   Medication Sig Start Date End Date Taking? Authorizing Provider  cyclobenzaprine (FLEXERIL) 10  MG tablet Take 1 tablet (10 mg total) by mouth 2 (two) times daily as needed for muscle spasms. 12/18/15  Yes Samantha Tripp Dowless, PA-C  diazepam (VALIUM) 5 MG tablet Take 1 tablet (5 mg total) by mouth every 8 (eight) hours as needed for muscle spasms. 09/23/16  Yes Glynn Octave, MD  diclofenac (VOLTAREN) 25 MG EC tablet Take 25 mg by mouth 2 (two) times daily.   Yes Historical Provider, MD  oxyCODONE-acetaminophen (PERCOCET/ROXICET) 5-325 MG tablet Take 1 tablet by mouth every 4 (four) hours as needed. Patient taking differently: Take 1 tablet by mouth every 4 (four) hours as  needed for moderate pain or severe pain.  03/31/16  Yes Tammy Triplett, PA-C    Family History Family History  Problem Relation Age of Onset  . Hypertension Mother     Social History Social History  Substance Use Topics  . Smoking status: Former Smoker    Years: 3.00    Types: Cigarettes    Quit date: 09/05/2007  . Smokeless tobacco: Never Used  . Alcohol use No     Comment: rare  employed   Allergies   Morphine and related and Codeine   Review of Systems Review of Systems  Constitutional: Negative for fever.  Respiratory: Negative for cough.   Gastrointestinal: Negative for abdominal pain, nausea and vomiting.  Genitourinary: Positive for difficulty urinating. Negative for frequency and hematuria.  Musculoskeletal: Positive for arthralgias and back pain.  Skin: Negative for rash.  Neurological: Positive for weakness (right knee) and numbness (right knee).  All other systems reviewed and are negative.    Physical Exam Updated Vital Signs BP 126/98 (BP Location: Left Arm)   Pulse (!) 126   Temp 98.4 F (36.9 C) (Oral)   Resp 20   Ht 5\' 8"  (1.727 m)   Wt 215 lb (97.5 kg)   LMP 08/28/2016   SpO2 100%   BMI 32.69 kg/m   Vital signs normal except for tachycardia   Physical Exam  Constitutional: She is oriented to person, place, and time. She appears well-developed and well-nourished.  Non-toxic appearance. She does not appear ill. No distress.  HENT:  Head: Normocephalic and atraumatic.  Right Ear: External ear normal.  Left Ear: External ear normal.  Nose: Nose normal. No mucosal edema or rhinorrhea.  Mouth/Throat: Oropharynx is clear and moist and mucous membranes are normal. No dental abscesses or uvula swelling.  Eyes: Conjunctivae and EOM are normal. Pupils are equal, round, and reactive to light.  Neck: Normal range of motion and full passive range of motion without pain. Neck supple.  Cardiovascular: Normal rate, regular rhythm and normal heart  sounds.  Exam reveals no gallop and no friction rub.   No murmur heard. Pulmonary/Chest: Effort normal and breath sounds normal. No respiratory distress. She has no wheezes. She has no rhonchi. She has no rales. She exhibits no tenderness and no crepitus.  Abdominal: Normal appearance.  Musculoskeletal: Normal range of motion. She exhibits tenderness. She exhibits no edema.       Back:  . Tender mid-lumbar spine and a little over SIJ on the right. Patellar reflexes are depressed. Tender over greater trochanter. Pt has difficulty changing positions.   Neurological: She is alert and oriented to person, place, and time. She has normal strength. No cranial nerve deficit.  Skin: Skin is warm, dry and intact. No rash noted. No erythema. No pallor.  Psychiatric: She has a normal mood and affect. Her speech is normal and behavior  is normal. Her mood appears not anxious.  Nursing note and vitals reviewed.    ED Treatments / Results   DIAGNOSTIC STUDIES: Oxygen Saturation is 100% on RA, normal by my interpretation.    .   Labs (all labs ordered are listed, but only abnormal results are displayed) Results for orders placed or performed during the hospital encounter of 09/24/16  I-Stat Chem 8, ED  Result Value Ref Range   Sodium 139 135 - 145 mmol/L   Potassium 4.1 3.5 - 5.1 mmol/L   Chloride 103 101 - 111 mmol/L   BUN 11 6 - 20 mg/dL   Creatinine, Ser 1.610.70 0.44 - 1.00 mg/dL   Glucose, Bld 096106 (H) 65 - 99 mg/dL   Calcium, Ion 0.451.15 4.091.15 - 1.40 mmol/L   TCO2 25 0 - 100 mmol/L   Hemoglobin 11.6 (L) 12.0 - 15.0 g/dL   HCT 81.134.0 (L) 91.436.0 - 78.246.0 %   Laboratory interpretation all normal except mild anemia    EKG  EKG Interpretation None       Radiology Mr Lumbar Spine Wo Contrast  Result Date: 09/25/2016 CLINICAL DATA:  36 y/o  F; chronic back pain worsening on Friday. EXAM: MRI LUMBAR SPINE WITHOUT CONTRAST TECHNIQUE: Multiplanar, multisequence MR imaging of the lumbar spine was  performed. No intravenous contrast was administered. COMPARISON:  12/31/2015 lumbar MRI. FINDINGS: Extensive motion degradation of all sequences. Segmentation:  Standard. Alignment:  Physiologic. Vertebrae:  No fracture, evidence of discitis, or bone lesion. Conus medullaris: Extends to the L1 level and appears normal. Paraspinal and other soft tissues: Negative. Disc levels: Increasing disc space narrowing at the L3-4 level enlarged central protrusion. The disc protrusion appears to impinge the descending L4 nerve roots in the lateral recesses, left greater than right (series 9, image 9 and 7). Additionally there is moderate to severe canal stenosis. Disc and facet disease results in mild-to-moderate foraminal narrowing at the L3-4 and L4-5 levels. At L4-5 this is stable from the prior study. IMPRESSION: Increased disc space narrowing and interval increase in central L3-4 disc protrusion from prior MRI. 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 Severe motion artifact of all sequences. Electronically Signed   By: Mitzi HansenLance  Furusawa-Stratton M.D.   On: 09/25/2016 05:51    Procedures Procedures (including critical care time)  Medications Ordered in ED Medications  fentaNYL (SUBLIMAZE) injection 50 mcg (50 mcg Intravenous Given 09/25/16 0024)  diazepam (VALIUM) tablet 10 mg (10 mg Oral Given 09/25/16 0003)  dexamethasone (DECADRON) injection 10 mg (10 mg Intravenous Given 09/25/16 0021)  ketorolac (TORADOL) 30 MG/ML injection 30 mg (30 mg Intravenous Given 09/25/16 0135)  HYDROmorphone (DILAUDID) injection 2 mg (2 mg Intravenous Given 09/25/16 0348)  ondansetron (ZOFRAN) injection 4 mg (4 mg Intravenous Given 09/25/16 0347)     Initial Impression / Assessment and Plan / ED Course  I have reviewed the triage vital signs and the nursing notes.  Pertinent labs & imaging results that were available during my care  of the patient were reviewed by me and considered in my medical decision making (see chart for details).  COORDINATION OF CARE: 11:58 PM Discussed treatment plan with pt at bedside which includes lumbar spine imaging and pain medication and pt agreed to plan.  Concern about cauda equina syndrome requires that she needs an MRI tonight, however we do not have MRI here over the weekend. Arrangements were made to  send her to [facility] to get a MRI done tonight.  12:27 AM Dr Blinda Leatherwood, ED physician at [facility], made aware of patient in case she needs to go to the ED.   After her MRI she was in the [facility] ED, neurosurgery is going to evaluate.   Final Clinical Impressions(s) / ED Diagnoses   Final diagnoses:  Urinary retention  Back pain   Disposition pending  Devoria Albe, MD, FACEP   I personally performed the services described in this documentation, which was scribed in my presence. The recorded information has been reviewed and considered.  Devoria Albe, MD, Concha Pyo, MD 09/25/16 [phone]

## 2016-09-24 NOTE — ED Triage Notes (Signed)
Patient c/o lower back pain that radiates into right knee. Per patient pain started when she tried to roll over in bed. Patient seen here on Friday for back pain. Patient reports taking Oxycodone, flexiril, Voltaren with no relief. Per patient drinking water but unable to void without pressing on bladder. Last voided 2 hours ago.

## 2016-09-24 NOTE — ED Notes (Signed)
Pt stated she has not been able to void since leaving work this morning. Pt will try to void does not want an in &out cath.

## 2016-09-25 ENCOUNTER — Emergency Department (HOSPITAL_COMMUNITY): Payer: 59

## 2016-09-25 ENCOUNTER — Emergency Department (HOSPITAL_COMMUNITY): Admission: EM | Admit: 2016-09-25 | Payer: 59 | Source: Home / Self Care

## 2016-09-25 DIAGNOSIS — M545 Low back pain: Secondary | ICD-10-CM | POA: Diagnosis not present

## 2016-09-25 DIAGNOSIS — Z743 Need for continuous supervision: Secondary | ICD-10-CM | POA: Diagnosis not present

## 2016-09-25 DIAGNOSIS — Z87891 Personal history of nicotine dependence: Secondary | ICD-10-CM | POA: Diagnosis not present

## 2016-09-25 DIAGNOSIS — M5126 Other intervertebral disc displacement, lumbar region: Secondary | ICD-10-CM | POA: Diagnosis not present

## 2016-09-25 DIAGNOSIS — R339 Retention of urine, unspecified: Secondary | ICD-10-CM | POA: Diagnosis not present

## 2016-09-25 DIAGNOSIS — M48061 Spinal stenosis, lumbar region without neurogenic claudication: Secondary | ICD-10-CM | POA: Diagnosis not present

## 2016-09-25 DIAGNOSIS — R279 Unspecified lack of coordination: Secondary | ICD-10-CM | POA: Diagnosis not present

## 2016-09-25 LAB — I-STAT CHEM 8, ED
BUN: 11 mg/dL (ref 6–20)
Calcium, Ion: 1.15 mmol/L (ref 1.15–1.40)
Chloride: 103 mmol/L (ref 101–111)
Creatinine, Ser: 0.7 mg/dL (ref 0.44–1.00)
Glucose, Bld: 106 mg/dL — ABNORMAL HIGH (ref 65–99)
HEMATOCRIT: 34 % — AB (ref 36.0–46.0)
HEMOGLOBIN: 11.6 g/dL — AB (ref 12.0–15.0)
POTASSIUM: 4.1 mmol/L (ref 3.5–5.1)
SODIUM: 139 mmol/L (ref 135–145)
TCO2: 25 mmol/L (ref 0–100)

## 2016-09-25 MED ORDER — KETOROLAC TROMETHAMINE 30 MG/ML IJ SOLN
INTRAMUSCULAR | Status: AC
  Administered 2016-09-25: 30 mg via INTRAVENOUS
  Filled 2016-09-25: qty 1

## 2016-09-25 MED ORDER — HYDROMORPHONE HCL 2 MG/ML IJ SOLN
2.0000 mg | Freq: Once | INTRAMUSCULAR | Status: AC
  Administered 2016-09-25: 2 mg via INTRAVENOUS
  Filled 2016-09-25: qty 1

## 2016-09-25 MED ORDER — PREDNISONE 20 MG PO TABS: ORAL_TABLET | ORAL | 0 refills | Status: DC

## 2016-09-25 MED ORDER — ONDANSETRON HCL 4 MG/2ML IJ SOLN
4.0000 mg | Freq: Once | INTRAMUSCULAR | Status: AC
  Administered 2016-09-25: 4 mg via INTRAVENOUS
  Filled 2016-09-25: qty 2

## 2016-09-25 MED ORDER — KETOROLAC TROMETHAMINE 30 MG/ML IJ SOLN
30.0000 mg | Freq: Once | INTRAMUSCULAR | Status: AC
  Administered 2016-09-25: 30 mg via INTRAVENOUS

## 2016-09-25 MED ORDER — HYDROMORPHONE HCL 2 MG/ML IJ SOLN
1.0000 mg | Freq: Once | INTRAMUSCULAR | Status: AC
  Administered 2016-09-25: 1 mg via INTRAVENOUS
  Filled 2016-09-25: qty 1

## 2016-09-25 NOTE — ED Notes (Signed)
Patient transported to MRI 

## 2016-09-25 NOTE — ED Notes (Signed)
Called into pt's room by pt and pt;'s primary RN, pt upset with delay in being seen due to being in pain and unable to urinate, offered to perform bladder scan on pt and to follow up with edp in reference to pain medication, primary RN notified, edp had already signed up to see pt when RN came out of room,

## 2016-09-25 NOTE — ED Notes (Signed)
[facility].

## 2016-09-25 NOTE — Consult Note (Signed)
Patient presented to [facility] ER for low back pain with urinary issues. Sent to [facility] Was evaluated for low back pain 2/9 as well as today.  MRI reveals: IMPRESSION: Increased disc space narrowing and interval increase in central L3-4 disc protrusion from prior MRI. 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 Severe motion artifact of all sequences.  Consult ordered by Dr Jaci Carrelhristopher Pollina  Pt seen and examined. Patient reports right sided low back pain with radiation to right knee Chronic problem x several years.  Sees pain management. Has had multiple epidurals without relief.  Denies urinary incontinence, fecal incontinence, saddle paresthesia.   EXAM: Temp:  [98 F (36.7 C)-98.4 F (36.9 C)] 98 F (36.7 C) (02/11 0302) Pulse Rate:  [82-126] 89 (02/11 0650) Resp:  [16-20] 16 (02/11 0650) BP: (111-127)/(63-101) 121/63 (02/11 0650) SpO2:  [95 %-100 %] 95 % (02/11 0650) Weight:  [97.5 kg (215 lb)] 97.5 kg (215 lb) (02/10 1840) Intake/Output      02/10 0701 - 02/11 0700 02/11 0701 - 02/12 0700        Urine Occurrence 1 x      Awake and alert Follows commands throughout Full strength CN grossly intact  Impression  Ana Pace with chronic low back pain with sciatica.  MRI reveals central disc protrusion L3-4 with moderate-severe lateral recess stenosis Discussed case with Dr Bevely Palmeritty. Does not appear to be cauda equina.  Plan  - She is under pain management.  - Rec she f/u with [place] in [place] but she would like second opinion. - Offered to follow up with [facility] for second opinion.  Reviewed and agree with above.  No evidence of cauda equina syndrome.  Follow up as an outpatient or with her surgeon.

## 2016-09-25 NOTE — ED Notes (Signed)
Pt arrived by [facility] for [facility], c/o chronic back pain. Pt reports worsening back pain on Friday after waking up. Denies new injury to back, does have numbness to R knee and leg, has been able to ambulate with increasing pain.

## 2016-09-25 NOTE — ED Provider Notes (Signed)
[facility]. Patient has a long history of lower back pain with worsening pain. Patient reports inability to pass urine this morning. She had received multiple agents prior to arrival without any relief of her pain. Dilaudid didn't seem to help her back pain, was able to perform the MRI. MRI shows worsening disc bulge and degenerative changes. This was discussed with neurosurgery, patient will be seen in the ER to help with further management.  Results for orders placed or performed during the hospital encounter of 09/24/16  I-Stat Chem 8, ED  Result Value Ref Range   Sodium 139 135 - 145 mmol/L   Potassium 4.1 3.5 - 5.1 mmol/L   Chloride 103 101 - 111 mmol/L   BUN 11 6 - 20 mg/dL   Creatinine, Ser 1.610.70 0.44 - 1.00 mg/dL   Glucose, Bld 096106 (H) 65 - 99 mg/dL   Calcium, Ion 0.451.15 4.091.15 - 1.40 mmol/L   TCO2 25 0 - 100 mmol/L   Hemoglobin 11.6 (L) 12.0 - 15.0 g/dL   HCT 81.134.0 (L) 91.436.0 - 78.246.0 %   Mr Lumbar Spine Wo Contrast  Result Date: 09/25/2016 CLINICAL DATA:  36 y/o  F; chronic back pain worsening on Friday. EXAM: MRI LUMBAR SPINE WITHOUT CONTRAST TECHNIQUE: Multiplanar, multisequence MR imaging of the lumbar spine was performed. No intravenous contrast was administered. COMPARISON:  12/31/2015 lumbar MRI. FINDINGS: Extensive motion degradation of all sequences. Segmentation:  Standard. Alignment:  Physiologic. Vertebrae:  No fracture, evidence of discitis, or bone lesion. Conus medullaris: Extends to the L1 level and appears normal. Paraspinal and other soft tissues: Negative. Disc levels: Increasing disc space narrowing at the L3-4 level enlarged central protrusion. The disc protrusion appears to impinge the descending L4 nerve roots in the lateral recesses, left greater than right (series 9, image 9 and 7). Additionally there is moderate to severe canal stenosis. Disc and facet disease results in mild-to-moderate foraminal narrowing at the L3-4 and L4-5 levels. At  L4-5 this is stable from the prior study. IMPRESSION: Increased disc space narrowing and interval increase in central L3-4 disc protrusion from prior MRI. 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 Severe motion artifact of all sequences. Electronically Signed   By: Mitzi HansenLance  Furusawa-Stratton M.D.   On: 09/25/2016 05:51      Gilda Creasehristopher J Gabryella Murfin, MD 09/25/16 [phone]

## 2016-09-27 ENCOUNTER — Other Ambulatory Visit: Payer: Self-pay | Admitting: *Deleted

## 2016-09-27 ENCOUNTER — Encounter: Payer: Self-pay | Admitting: *Deleted

## 2016-09-27 NOTE — Patient Outreach (Signed)
[facility]) Care Management  09/27/2016  Ana Pace 07-13-1981 [id number]  Subjective:  Telephone call to patient's home / mobile number, spoke with patient, and HIPAA verified.  Discussed [facility] Care Management UMR ED utilization follow up, UMR Transition of care follow up, [facility] Care Management services, patient voices understanding, and is in agreement to complete ED follow up.   Patient states she is in a lot of pain, getting ready to take some pain medication, when she returns home, since she is currently out running an errand.   States during her most recent ED visit she learned that MRI has changed significantly since last MRI in May 2017 and is in need of surgery. Her symptoms has increased to difficulty urinating, increase right leg, right hip, and knee pain.    States she currently is a patient of Dr. Sharlene Motts (Neurosurgeon) who is out of the [facility] network and is wanting to transfer to an in [facility] network MD for neurosurgical follow up. She has been referred to [facility] by consulting MD (Dr. Bevely Palmer) who saw her while she was in the ED on 09/25/16.   States she has left a message for Dr. Mervyn Gay assistance to set up an appointment to discuss surgical options and is waiting for call back.   Her pain has been managed in the past by steroid injections and oral pain medications.  States she is ready to progress to the surgery treatment options because her pain in no longer being managed by current treatment and has caused increase in her ED utilization.   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   RNCM educated patient on the following [place] Employee benefits and patient given contact information:  [employer] Nurse, learning disability)  [phone],   [facility] [phone], [employer] [facility] benefit specialist Florene Route)  [phone], and Employee Assistance Counseling Program [place]) [phone].   Patient voices understanding, is very appreciative of the information, states she will call benefit specialist to verify FMLA eligibility, will follow up to initiate the FMLA process if eligible, call EACP (Employee Assistance Counseling Program) to set up an appointment (follow up on depression screening results), and will call [facility] outpatient pharmacy to get her medications transferred to [facility] from local pharmacy [facility]) .   PHQ2 & 9 screening given, patient states she has a history of depression, is not currently on medications, is in the process of changing primary MD, and will follow up with EACP regarding counseling.    States she is feeling down due to back pain, wants to receive treatment that will manage back pain,  and does not wish to harm self or others.   Patient states she does not have any transition of care, care coordination, disease management, disease monitoring, transportation, community resource, or pharmacy needs at this time.  States she is very appreciative of the follow up call and is in agreement to receive [facility] Care Management information.  Depression screen PHQ 2/9 09/27/2016  Decreased Interest 1  Down, Depressed, Hopeless 1  PHQ - 2 Score 2  Altered sleeping 3  Tired, decreased energy 1  Change in appetite (No Data)  Feeling bad or failure about yourself  0  Trouble concentrating 2  Moving slowly or fidgety/restless 2  Suicidal thoughts 0  PHQ-9 Score 10  Difficult doing work/chores Somewhat difficult   Outpatient Encounter Prescriptions as of 09/27/2016  Medication Sig  . cyclobenzaprine (FLEXERIL) 10 MG tablet Take 1 tablet (10 mg total) by mouth 2 (two) times daily as needed for muscle spasms.  . diazepam (VALIUM) 5 MG tablet Take 1 tablet (5 mg total) by mouth every 8 (eight) hours as needed  for muscle spasms.  . diclofenac (VOLTAREN) 25 MG EC tablet Take 25 mg by mouth 2 (two) times daily.  Marland Kitchen. oxyCODONE-acetaminophen (PERCOCET/ROXICET) 5-325 MG tablet Take 1 tablet by mouth every 4 (four) hours as needed. (Patient taking differently: Take 1 tablet by mouth every 4 (four) hours as needed for moderate pain or severe pain. )  . predniSONE (DELTASONE) 20 MG tablet 3 tabs po daily x 3 days, then 2 tabs x 3 days, then 1.5 tabs x 3 days, then 1 tab x 3 days, then 0.5 tabs x 3 days   No facility-administered encounter medications on file as of 09/27/2016.      Objective:  Per chart review: patient has ED visit on 09/23/16 - 09/24/16 for urinary retention and increased back pain, [facility]  [facility]  for MRI follow up.  Also had ED visit on  08/16/16 for back pain, 04/22/16 for palpations (sinus tachycardia), and on  03/31/16 for back pain.   Patient also has a history of Endometriosis.     Assessment: Received ED census report referral on 09/26/16.  Referral trigger: Patient has had 6 ED visits in the past 6 months.   ED follow up completed, no care management needs, and will proceed with case closure.   Plan: RNCM will send patient successful outreach letter, [facility] pamphlet, and magnet.   RNCM will send case closure due to follow up completed / no care management needs request to Ana [facility] at [facility].   Ana Pace H. Gardiner Barefootooper RN, BSN, CCM [facility] Care Management [facility] Telephonic CM Phone: [phone] Fax: [phone]

## 2016-10-02 ENCOUNTER — Emergency Department (HOSPITAL_COMMUNITY): Payer: 59

## 2016-10-02 ENCOUNTER — Encounter (HOSPITAL_COMMUNITY): Payer: Self-pay

## 2016-10-02 ENCOUNTER — Emergency Department (HOSPITAL_COMMUNITY)
Admission: EM | Admit: 2016-10-02 | Discharge: 2016-10-02 | Disposition: A | Discharge status: Home / Self Care | Payer: 59 | Attending: Emergency Medicine | Admitting: Emergency Medicine

## 2016-10-02 DIAGNOSIS — M25562 Pain in left knee: Secondary | ICD-10-CM | POA: Diagnosis not present

## 2016-10-02 DIAGNOSIS — G8929 Other chronic pain: Secondary | ICD-10-CM | POA: Diagnosis not present

## 2016-10-02 DIAGNOSIS — M7989 Other specified soft tissue disorders: Secondary | ICD-10-CM | POA: Diagnosis not present

## 2016-10-02 DIAGNOSIS — Z79899 Other long term (current) drug therapy: Secondary | ICD-10-CM | POA: Insufficient documentation

## 2016-10-02 DIAGNOSIS — R609 Edema, unspecified: Secondary | ICD-10-CM | POA: Diagnosis not present

## 2016-10-02 DIAGNOSIS — M25561 Pain in right knee: Secondary | ICD-10-CM | POA: Diagnosis not present

## 2016-10-02 DIAGNOSIS — M5441 Lumbago with sciatica, right side: Secondary | ICD-10-CM | POA: Diagnosis not present

## 2016-10-02 DIAGNOSIS — M545 Low back pain: Secondary | ICD-10-CM | POA: Insufficient documentation

## 2016-10-02 DIAGNOSIS — Z87891 Personal history of nicotine dependence: Secondary | ICD-10-CM | POA: Diagnosis not present

## 2016-10-02 LAB — I-STAT CHEM 8, ED
BUN: 10 mg/dL (ref 6–20)
CREATININE: 0.8 mg/dL (ref 0.44–1.00)
Calcium, Ion: 1.11 mmol/L — ABNORMAL LOW (ref 1.15–1.40)
Chloride: 97 mmol/L — ABNORMAL LOW (ref 101–111)
Glucose, Bld: 94 mg/dL (ref 65–99)
HEMATOCRIT: 37 % (ref 36.0–46.0)
HEMOGLOBIN: 12.6 g/dL (ref 12.0–15.0)
Potassium: 4.1 mmol/L (ref 3.5–5.1)
SODIUM: 137 mmol/L (ref 135–145)
TCO2: 31 mmol/L (ref 0–100)

## 2016-10-02 LAB — I-STAT BETA HCG BLOOD, ED (MC, WL, AP ONLY): I-stat hCG, quantitative: <5 m[IU]/mL (ref <5)

## 2016-10-02 MED ORDER — FENTANYL CITRATE (PF) 100 MCG/2ML IJ SOLN
INTRAMUSCULAR | Status: AC
  Filled 2016-10-02: qty 2

## 2016-10-02 MED ORDER — HYDROCODONE-ACETAMINOPHEN 5-325 MG PO TABS: ORAL_TABLET | ORAL | 0 refills | Status: DC

## 2016-10-02 MED ORDER — DIAZEPAM 5 MG PO TABS
5.0000 mg | ORAL_TABLET | Freq: Once | ORAL | Status: AC
  Administered 2016-10-02: 5 mg via ORAL
  Filled 2016-10-02: qty 1

## 2016-10-02 MED ORDER — FENTANYL CITRATE (PF) 100 MCG/2ML IJ SOLN
50.0000 ug | Freq: Once | INTRAMUSCULAR | Status: AC
  Administered 2016-10-02: 50 ug via INTRAMUSCULAR
  Filled 2016-10-02: qty 2

## 2016-10-02 MED ORDER — METHOCARBAMOL 500 MG PO TABS: 1000.0000 mg | ORAL_TABLET | Freq: Four times a day (QID) | ORAL | 0 refills | Status: DC | PRN

## 2016-10-02 MED ORDER — HYDROMORPHONE HCL 1 MG/ML IJ SOLN
1.0000 mg | Freq: Once | INTRAMUSCULAR | Status: AC
  Administered 2016-10-02: 1 mg via INTRAMUSCULAR
  Filled 2016-10-02: qty 1

## 2016-10-02 NOTE — ED Triage Notes (Signed)
Reports of lower back pain as well as right knee pain.

## 2016-10-02 NOTE — ED Provider Notes (Signed)
[facility]-EM[facility] DEPT Provider Note   CSN: [id number] Arrival date & time: 10/02/16  1903     History   Chief Complaint Chief Complaint  Patient presents with  . Back Pain    HPI Ana Pace is a 36 y.o. female.  HPI  Pt was seen at 1925. Per pt, c/o gradual onset and persistence of constant acute flair of her chronic low back "pain" for the past several weeks.  Denies any change in her usual chronic pain pattern with radiation down her RLE.  Pain worsens with palpation of the area and body position changes. Pt also feels both of her "legs are swollen" since she started taking prednisone 2 days ago. Pt states she had a MRI LS 2 days ago which revealed worsening lumbar disc disease. Denies incont/retention of bowel or bladder, no saddle anesthesia, no focal motor weakness, no tingling/numbness in extremities, no fevers, no direct injury, no abd pain.   The symptoms have been associated with no other complaints. The patient has a significant history of similar symptoms previously, recently being evaluated for this complaint and multiple prior evals for same.    Past Medical History:  Diagnosis Date  . Abnormal Pap smear   . Endometrial hyperplasia without atypia, simple   . Endometriosis   . GERD (gastroesophageal reflux disease)    tums prn  . Kidney stone   . Ovarian cyst     Patient Active Problem List   Diagnosis Date Noted  . Endometriosis 04/02/2015  . Cesarean delivery, without mention of indication, delivered, with or without mention of antepartum condition 04/02/2012    Past Surgical History:  Procedure Laterality Date  . CESAREAN SECTION     x3  . CESAREAN SECTION  04/02/2012   Procedure: CESAREAN SECTION;  Surgeon: Antionette Char, MD;  Location: [facility];  Service: Obstetrics;  Laterality: N/A;  Repeat cesarean section with delivery of baby girl at 66.  . CHOLECYSTECTOMY    . DIAGNOSTIC LAPAROSCOPY     endometriosis  . LITHOTRIPSY      OB History    Gravida Para Term Preterm AB Living   3 3 1     3    SAB TAB Ectopic Multiple Live Births           1       Home Medications    Prior to Admission medications   Medication Sig Start Date End Date Taking? Authorizing Provider  cyclobenzaprine (FLEXERIL) 10 MG tablet Take 1 tablet (10 mg total) by mouth 2 (two) times daily as needed for muscle spasms. 12/18/15   Samantha Tripp Dowless, PA-C  diazepam (VALIUM) 5 MG tablet Take 1 tablet (5 mg total) by mouth every 8 (eight) hours as needed for muscle spasms. 09/23/16   Glynn Octave, MD  diclofenac (VOLTAREN) 25 MG EC tablet Take 25 mg by mouth 2 (two) times daily.    Historical Provider, MD  oxyCODONE-acetaminophen (PERCOCET/ROXICET) 5-325 MG tablet Take 1 tablet by mouth every 4 (four) hours as needed. Patient taking differently: Take 1 tablet by mouth every 4 (four) hours as needed for moderate pain or severe pain.  03/31/16   Tammy Triplett, PA-C  predniSONE (DELTASONE) 20 MG tablet 3 tabs po daily x 3 days, then 2 tabs x 3 days, then 1.5 tabs x 3 days, then 1 tab x 3 days, then 0.5 tabs x 3 days 09/25/16   Gilda Crease, MD    Family History Family History  Problem Relation Age of  Onset  . Hypertension Mother     Social History Social History  Substance Use Topics  . Smoking status: Former Smoker    Years: 3.00    Types: Cigarettes    Quit date: 09/05/2007  . Smokeless tobacco: Never Used  . Alcohol use No     Comment: rare     Allergies   Morphine and related and Codeine   Review of Systems Review of Systems ROS: Statement: All systems negative except as marked or noted in the HPI; Constitutional: Negative for fever and chills. ; ; Eyes: Negative for eye pain, redness and discharge. ; ; ENMT: Negative for ear pain, hoarseness, nasal congestion, sinus pressure and sore throat. ; ; Cardiovascular: Negative for chest pain, palpitations, diaphoresis, dyspnea and +peripheral edema. ; ; Respiratory: Negative for cough,  wheezing and stridor. ; ; Gastrointestinal: Negative for nausea, vomiting, diarrhea, abdominal pain, blood in stool, hematemesis, jaundice and rectal bleeding. . ; ; Genitourinary: Negative for dysuria, flank pain and hematuria. ; ; Musculoskeletal: +LBP. Negative for neck pain. Negative for deformity and trauma.; ; Skin: Negative for pruritus, rash, abrasions, blisters, bruising and skin lesion.; ; Neuro: Negative for headache, lightheadedness and neck stiffness. Negative for weakness, altered level of consciousness, altered mental status, extremity weakness, paresthesias, involuntary movement, seizure and syncope.       Physical Exam Updated Vital Signs BP 130/89 (BP Location: Left Arm)   Pulse 120   Temp 97.4 F (36.3 C) (Oral)   Ht 5\' 8"  (1.727 m)   Wt 215 lb (97.5 kg)   SpO2 100%   BMI 32.69 kg/m   Physical Exam 1930: Physical examination:  Nursing notes reviewed; Vital signs and O2 SAT reviewed;  Constitutional: Well developed, Well nourished, Well hydrated, In no acute distress; Head:  Normocephalic, atraumatic; Eyes: EOMI, PERRL, No scleral icterus; ENMT: Mouth and pharynx normal, Mucous membranes moist; Neck: Supple, Full range of motion, No lymphadenopathy; Cardiovascular: Regular rate and rhythm, No murmur, rub, or gallop; Respiratory: Breath sounds clear & equal bilaterally, No rales, rhonchi, wheezes.  Speaking full sentences with ease, Normal respiratory effort/excursion; Chest: Nontender, Movement normal; Abdomen: Soft, Nontender, Nondistended, Normal bowel sounds; Genitourinary: No CVA tenderness; Spine:  No midline CS, TS, LS tenderness. +TTP right lumbar paraspinal muscles.;; Extremities: Pulses normal, +FROM bilat knees, including able to lift extended bilat LE against gravity, and extend bilateral lower legs against resistance.  No ligamentous laxity.  No patellar or quad tendon step-offs.  NMS intact bilateral feet, strong pedal pp. +plantarflexion of right and left foot  w/calf squeeze.  No palpable gap right and left Achilles's tendon.  No proximal fibular head tenderness.  No erythema, warmth, ecchymosis or deformity. +mild generalized TTP right and left knees, no specific area of point tenderness.  +tr pedal edema bilat feet to thighs, esp bilat knees. No calf asymmetry, no popliteal tenderness..; Neuro: AA&Ox3, Major CN grossly intact.  Speech clear. No gross focal motor or sensory deficits in extremities.; Skin: Color normal, Warm, Dry.   ED Treatments / Results  Labs (all labs ordered are listed, but only abnormal results are displayed)   EKG  EKG Interpretation None       Radiology   Procedures Procedures (including critical care time)  Medications Ordered in ED Medications  fentaNYL (SUBLIMAZE) injection 50 mcg (50 mcg Intramuscular Given 10/02/16 1941)  diazepam (VALIUM) tablet 5 mg (5 mg Oral Given 10/02/16 1941)  HYDROmorphone (DILAUDID) injection 1 mg (1 mg Intramuscular Given 10/02/16 2039)  Initial Impression / Assessment and Plan / ED Course  I have reviewed the triage vital signs and the nursing notes.  Pertinent labs & imaging results that were available during my care of the patient were reviewed by me and considered in my medical decision making (see chart for details).  MDM Reviewed: previous chart, nursing note and vitals Reviewed previous: MRI and labs Interpretation: x-ray and labs   Results for orders placed or performed during the hospital encounter of 10/02/16  I-stat Chem 8, ED  Result Value Ref Range   Sodium 137 135 - 145 mmol/L   Potassium 4.1 3.5 - 5.1 mmol/L   Chloride 97 (L) 101 - 111 mmol/L   BUN 10 6 - 20 mg/dL   Creatinine, Ser 4.090.80 0.44 - 1.00 mg/dL   Glucose, Bld 94 65 - 99 mg/dL   Calcium, Ion 8.111.11 (L) 1.15 - 1.40 mmol/L   TCO2 31 0 - 100 mmol/L   Hemoglobin 12.6 12.0 - 15.0 g/dL   HCT 91.437.0 78.236.0 - 95.646.0 %  I-Stat beta hCG blood, ED  Result Value Ref Range   I-stat hCG, quantitative <5.0 <5  mIU/mL   Comment 3           Dg Knee Complete 4 Views Left Result Date: 10/02/2016 CLINICAL DATA:  Patient with bilateral knee pain and swelling. No known injury. Initial encounter. EXAM: LEFT KNEE - COMPLETE 4+ VIEW COMPARISON:  None. FINDINGS: No evidence of fracture, dislocation, or joint effusion. No evidence of arthropathy or other focal bone abnormality. Soft tissues are unremarkable. IMPRESSION: No acute osseous abnormality. Electronically Signed   By: Annia Beltrew  Davis M.D.   On: 10/02/2016 20:22   Dg Knee Complete 4 Views Right Result Date: 10/02/2016 CLINICAL DATA:  Patient with bilateral knee pain. No known injury. Initial encounter. EXAM: RIGHT KNEE - COMPLETE 4+ VIEW COMPARISON:  None. FINDINGS: No evidence of fracture, dislocation, or joint effusion. No evidence of arthropathy or other focal bone abnormality. Soft tissues are unremarkable. IMPRESSION: No acute osseous abnormality. Electronically Signed   By: Annia Beltrew  Davis M.D.   On: 10/02/2016 20:23    2130:  Feels better after pain meds and wants to go home now. Workup reassuring. Long d/w pt regarding Vasc US r/o DVT. Pt declines at this time. Tx symptomatically, f/o Neurosurgeon this week as previously scheduled. Dx and testing d/w pt and family.  Questions answered.  Verb understanding, agreeable to d/c home with outpt f/u.   Final Clinical Impressions(s) / ED Diagnoses   Final diagnoses:  None    New Prescriptions New Prescriptions   No medications on file     Samuel JesterKathleen Leeon Makar, DO 10/05/16 1631

## 2016-10-02 NOTE — Discharge Instructions (Signed)
Take the prescriptions as directed.  Apply moist heat or ice to the area(s) of discomfort, for 15 minutes at a time, several times per day for the next few days.  Do not fall asleep on a heating or ice pack.  Call your regular medical doctor and your Neurosurgeon on Monday to schedule a follow up appointment in the next 2 to 3 days.  Return to the Emergency Department immediately if worsening.

## 2016-10-03 ENCOUNTER — Other Ambulatory Visit: Payer: Self-pay | Admitting: *Deleted

## 2016-10-03 NOTE — Patient Outreach (Addendum)
[facility]) Care Management  10/03/2016  Ana RistJackie K Pace 04/13/1981 [id number]   Subjective: Received voicemail message from patient, states she is in need of the phone number to [employer], has misplaced the number, and requested call back.  Telephone to patient's home / mobile number, no answer, left HIPAA compliant voicemail message, and requested call back.  Objective:  Per chart review: patient has ED visit on 09/23/16 - 09/24/16 for urinary retention and increased back pain, [facility]  [facility]  for MRI follow up.  Also had ED visit on  08/16/16 for back pain, 04/22/16 for palpations (sinus tachycardia), and on  03/31/16 for back pain.   Patient also has a history of Endometriosis.     Assessment: Received ED census report referral on 09/26/16.  Referral trigger: Patient has had 6 ED visits in the past 6 months.   ED follow up completed, no care management needs, and attempt to reach out to patient to follow up on her questions.  Plan: RNCM will call patient for 2nd telephone outreach attempt, follow up regarding [employer] contact information, within 10 business days if no return call.   Hibo Blasdell H. Gardiner Barefootooper RN, BSN, CCM [facility] Care Management [facility] Telephonic CM Phone: [phone] Fax: [phone]

## 2016-10-04 ENCOUNTER — Other Ambulatory Visit: Payer: Self-pay | Admitting: *Deleted

## 2016-10-04 NOTE — Patient Outreach (Signed)
[facility]) Care Management  10/04/2016  Ana Pace 08/17/1980 [id number]   Subjective: Telephone call to patient home / mobile number times 2 and HIPAA verified.   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 Mervyn Gayitty's office regarding earlier appointment, has appointment scheduled for 10/06/16, and taking medication as prescribed when not working.  States she has also spoken with Ana Pace at Dr. Mervyn Gayitty's office and was transferred to [employer]'s voicemail since Al[place] schedules appointments for Dr. Bevely Palmeritty.  Patient states she has already spoken with [employer] regarding family medical leave act (FMLA) eligibility and will not be eligible until August of  2018.  States her supervisor worked with her last week and she was given limited task to complete at work but still had to check herself into the ED after working a full shift due to increase back pain.  RNCM facilitated conference call with patient and Liborio NixonJanice at Dr. Mervyn Gayitty's office (operator, [phone], neurosurgeon's office) to arrange for light duty at work if MD feels appropriate and since patient not currently eligible for family medical leave act.  RNCM on the phone line while patient left a voicemail message for Ana Pace at Dr. Mervyn Gayitty's office regarding additional request for work light duty MD note.  Patient also given phone numbers to [facility] benefit specialist Ana RouteDebbie Pace [phone]( [phone]) and [employer] [place] [employer] [phone]) to follow up regarding short term disability questions.  Patient states she does not have any transition of care, care coordination, disease management, disease monitoring, transportation, community resource, or pharmacy needs at this time.  States she is very appreciative of the follow up  call,  still has RNCM contact information, and will call if any additional assistance needed.   Objective:  Per chart review: patient has ED visit on 09/23/16 - 09/24/16 for urinary retention and increased back pain, [facility]  [facility]  for MRI follow up.  Also had ED visit on  08/16/16 for back pain, 04/22/16 for palpations (sinus tachycardia), and on  03/31/16 for back pain.   Patient also has a history of Endometriosis.     Assessment: Received ED census report referral on 09/26/16.  Referral trigger: Patient has had 6 ED visits in the past 6 months.   ED follow up completed, no care management needs, and case will remain closed   Plan: Case will remain closed and patient will call RNCM if any additional needs.   Etoy Mcdonnell H. Gardiner Barefootooper RN, BSN, CCM [facility] Care Management [facility] Telephonic CM Phone: [phone] Fax: [phone]

## 2016-10-06 ENCOUNTER — Encounter (HOSPITAL_COMMUNITY): Payer: Self-pay | Admitting: *Deleted

## 2016-10-06 ENCOUNTER — Other Ambulatory Visit: Payer: Self-pay | Admitting: Neurological Surgery

## 2016-10-06 DIAGNOSIS — M5416 Radiculopathy, lumbar region: Secondary | ICD-10-CM | POA: Diagnosis not present

## 2016-10-06 DIAGNOSIS — M4727 Other spondylosis with radiculopathy, lumbosacral region: Secondary | ICD-10-CM | POA: Diagnosis not present

## 2016-10-06 DIAGNOSIS — M5127 Other intervertebral disc displacement, lumbosacral region: Secondary | ICD-10-CM | POA: Diagnosis not present

## 2016-10-06 DIAGNOSIS — Z6835 Body mass index (BMI) 35.0-35.9, adult: Secondary | ICD-10-CM | POA: Diagnosis not present

## 2016-10-06 NOTE — Progress Notes (Signed)
Pt denies SOB, chest pain, and being under the care of a cardiologist. Pt denies having a stress test, echo and cardiac cath. Pt denies having a chest x ray within the last year. Pt labs in Epic. Pt made aware to stop taking Aspirin, vitamins, fish oil and herbal medications. Do not take any NSAIDs ie: Ibuprofen, Advil, Naproxen, BC and Goody Powder or any medication containing Aspirin. Pt verbalized understanding of all pre-op instructions.

## 2016-10-07 ENCOUNTER — Ambulatory Visit (HOSPITAL_COMMUNITY): Payer: 59 | Admitting: Certified Registered Nurse Anesthetist

## 2016-10-07 ENCOUNTER — Ambulatory Visit (HOSPITAL_COMMUNITY): Payer: 59

## 2016-10-07 ENCOUNTER — Encounter (HOSPITAL_COMMUNITY): Payer: Self-pay | Admitting: *Deleted

## 2016-10-07 ENCOUNTER — Encounter (HOSPITAL_COMMUNITY)
Admission: AD | Disposition: A | Discharge status: Home / Self Care | Payer: Self-pay | Source: Ambulatory Visit | Attending: Neurological Surgery

## 2016-10-07 ENCOUNTER — Inpatient Hospital Stay (HOSPITAL_COMMUNITY)
Admission: AD | Admit: 2016-10-07 | Discharge: 2016-10-08 | DRG: 520 | Disposition: A | Discharge status: Home / Self Care | Payer: 59 | Source: Ambulatory Visit | Attending: Neurological Surgery | Admitting: Neurological Surgery

## 2016-10-07 DIAGNOSIS — M5126 Other intervertebral disc displacement, lumbar region: Secondary | ICD-10-CM | POA: Diagnosis present

## 2016-10-07 DIAGNOSIS — Z419 Encounter for procedure for purposes other than remedying health state, unspecified: Secondary | ICD-10-CM

## 2016-10-07 DIAGNOSIS — Z6835 Body mass index (BMI) 35.0-35.9, adult: Secondary | ICD-10-CM

## 2016-10-07 DIAGNOSIS — E669 Obesity, unspecified: Secondary | ICD-10-CM | POA: Diagnosis present

## 2016-10-07 DIAGNOSIS — Z8249 Family history of ischemic heart disease and other diseases of the circulatory system: Secondary | ICD-10-CM

## 2016-10-07 DIAGNOSIS — Z886 Allergy status to analgesic agent status: Secondary | ICD-10-CM | POA: Diagnosis not present

## 2016-10-07 DIAGNOSIS — R937 Abnormal findings on diagnostic imaging of other parts of musculoskeletal system: Secondary | ICD-10-CM | POA: Diagnosis not present

## 2016-10-07 DIAGNOSIS — Z885 Allergy status to narcotic agent status: Secondary | ICD-10-CM | POA: Diagnosis not present

## 2016-10-07 DIAGNOSIS — M5116 Intervertebral disc disorders with radiculopathy, lumbar region: Secondary | ICD-10-CM | POA: Diagnosis present

## 2016-10-07 DIAGNOSIS — Z79899 Other long term (current) drug therapy: Secondary | ICD-10-CM | POA: Diagnosis not present

## 2016-10-07 DIAGNOSIS — M5127 Other intervertebral disc displacement, lumbosacral region: Secondary | ICD-10-CM | POA: Diagnosis not present

## 2016-10-07 DIAGNOSIS — IMO0002 Reserved for concepts with insufficient information to code with codable children: Secondary | ICD-10-CM | POA: Diagnosis present

## 2016-10-07 HISTORY — PX: LUMBAR LAMINECTOMY/DECOMPRESSION MICRODISCECTOMY: SHX5026

## 2016-10-07 HISTORY — DX: Anxiety disorder, unspecified: F41.9

## 2016-10-07 HISTORY — DX: Other intervertebral disc displacement, lumbosacral region: M51.27

## 2016-10-07 LAB — SURGICAL PCR SCREEN
MRSA, PCR: NEGATIVE
STAPHYLOCOCCUS AUREUS: NEGATIVE

## 2016-10-07 SURGERY — LUMBAR LAMINECTOMY/DECOMPRESSION MICRODISCECTOMY 1 LEVEL
Anesthesia: General | Site: Back | Laterality: Right

## 2016-10-07 MED ORDER — ONDANSETRON 8 MG PO TBDP
8.0000 mg | ORAL_TABLET | Freq: Four times a day (QID) | ORAL | Status: DC | PRN
  Administered 2016-10-07: 8 mg via ORAL
  Filled 2016-10-07 (×2): qty 1

## 2016-10-07 MED ORDER — MIDAZOLAM HCL 5 MG/5ML IJ SOLN
INTRAMUSCULAR | Status: DC | PRN
  Administered 2016-10-07: 2 mg via INTRAVENOUS

## 2016-10-07 MED ORDER — HEMOSTATIC AGENTS (NO CHARGE) OPTIME
TOPICAL | Status: DC | PRN
  Administered 2016-10-07: 1 via TOPICAL

## 2016-10-07 MED ORDER — SODIUM CHLORIDE 0.9% FLUSH: 3.0000 mL | INTRAVENOUS | Status: DC | PRN

## 2016-10-07 MED ORDER — METHOCARBAMOL 500 MG PO TABS
500.0000 mg | ORAL_TABLET | Freq: Four times a day (QID) | ORAL | Status: DC | PRN
  Administered 2016-10-07: 500 mg via ORAL

## 2016-10-07 MED ORDER — SENNOSIDES-DOCUSATE SODIUM 8.6-50 MG PO TABS: 1.0000 | ORAL_TABLET | Freq: Every evening | ORAL | Status: DC | PRN

## 2016-10-07 MED ORDER — LACTATED RINGERS IV SOLN
INTRAVENOUS | Status: DC | PRN
  Administered 2016-10-07 (×2): via INTRAVENOUS

## 2016-10-07 MED ORDER — ZOLPIDEM TARTRATE 5 MG PO TABS: 5.0000 mg | ORAL_TABLET | Freq: Every evening | ORAL | Status: DC | PRN

## 2016-10-07 MED ORDER — KETOROLAC TROMETHAMINE 30 MG/ML IJ SOLN
INTRAMUSCULAR | Status: DC | PRN
  Administered 2016-10-07: 30 mg

## 2016-10-07 MED ORDER — FENTANYL CITRATE (PF) 100 MCG/2ML IJ SOLN
INTRAMUSCULAR | Status: AC
  Filled 2016-10-07: qty 2

## 2016-10-07 MED ORDER — METHYLPREDNISOLONE ACETATE 80 MG/ML IJ SUSP
INTRAMUSCULAR | Status: AC
  Filled 2016-10-07: qty 1

## 2016-10-07 MED ORDER — OXYCODONE HCL 5 MG PO TABS
ORAL_TABLET | ORAL | Status: AC
  Filled 2016-10-07: qty 2

## 2016-10-07 MED ORDER — LIDOCAINE HCL (CARDIAC) 20 MG/ML IV SOLN
INTRAVENOUS | Status: DC | PRN
  Administered 2016-10-07: 30 mg via INTRAVENOUS

## 2016-10-07 MED ORDER — CEFAZOLIN SODIUM-DEXTROSE 2-4 GM/100ML-% IV SOLN
INTRAVENOUS | Status: AC
  Filled 2016-10-07: qty 100

## 2016-10-07 MED ORDER — FLEET ENEMA 7-19 GM/118ML RE ENEM: 1.0000 | ENEMA | Freq: Once | RECTAL | Status: DC | PRN

## 2016-10-07 MED ORDER — OXYCODONE HCL 5 MG PO TABS
5.0000 mg | ORAL_TABLET | ORAL | Status: DC | PRN
  Administered 2016-10-07: 10 mg via ORAL

## 2016-10-07 MED ORDER — ACETAMINOPHEN 650 MG RE SUPP: 650.0000 mg | Freq: Four times a day (QID) | RECTAL | Status: DC | PRN

## 2016-10-07 MED ORDER — METHYLPREDNISOLONE ACETATE 80 MG/ML IJ SUSP
INTRAMUSCULAR | Status: DC | PRN
  Administered 2016-10-07: 80 mg

## 2016-10-07 MED ORDER — PANTOPRAZOLE SODIUM 40 MG IV SOLR
40.0000 mg | Freq: Every day | INTRAVENOUS | Status: DC
  Administered 2016-10-07: 40 mg via INTRAVENOUS
  Filled 2016-10-07: qty 40

## 2016-10-07 MED ORDER — MUPIROCIN 2 % EX OINT
1.0000 "application " | TOPICAL_OINTMENT | Freq: Once | CUTANEOUS | Status: AC
  Administered 2016-10-07: 1 via TOPICAL

## 2016-10-07 MED ORDER — SENNA 8.6 MG PO TABS
1.0000 | ORAL_TABLET | Freq: Two times a day (BID) | ORAL | Status: DC
  Administered 2016-10-07 – 2016-10-08 (×2): 8.6 mg via ORAL
  Filled 2016-10-07 (×2): qty 1

## 2016-10-07 MED ORDER — THROMBIN 5000 UNITS EX SOLR
OROMUCOSAL | Status: DC | PRN
  Administered 2016-10-07: 18:00:00 via TOPICAL

## 2016-10-07 MED ORDER — PROPOFOL 10 MG/ML IV BOLUS
INTRAVENOUS | Status: AC
  Filled 2016-10-07: qty 20

## 2016-10-07 MED ORDER — SUGAMMADEX SODIUM 200 MG/2ML IV SOLN
INTRAVENOUS | Status: DC | PRN
  Administered 2016-10-07: 200 mg via INTRAVENOUS

## 2016-10-07 MED ORDER — ACETAMINOPHEN 325 MG PO TABS: 650.0000 mg | ORAL_TABLET | Freq: Four times a day (QID) | ORAL | Status: DC | PRN

## 2016-10-07 MED ORDER — ONDANSETRON HCL 4 MG/2ML IJ SOLN
INTRAMUSCULAR | Status: DC | PRN
  Administered 2016-10-07: 4 mg via INTRAVENOUS

## 2016-10-07 MED ORDER — SODIUM CHLORIDE 0.9 % IR SOLN
Status: DC | PRN
  Administered 2016-10-07: 18:00:00

## 2016-10-07 MED ORDER — HYDROMORPHONE HCL 1 MG/ML IJ SOLN
INTRAMUSCULAR | Status: AC
  Administered 2016-10-07: 0.5 mg via INTRAVENOUS
  Filled 2016-10-07: qty 1

## 2016-10-07 MED ORDER — DICLOFENAC SODIUM 25 MG PO TBEC
25.0000 mg | DELAYED_RELEASE_TABLET | Freq: Two times a day (BID) | ORAL | Status: DC
  Administered 2016-10-07 – 2016-10-08 (×2): 25 mg via ORAL
  Filled 2016-10-07 (×2): qty 1

## 2016-10-07 MED ORDER — THROMBIN 5000 UNITS EX SOLR
CUTANEOUS | Status: DC | PRN
  Administered 2016-10-07 (×2): 5000 [IU] via TOPICAL

## 2016-10-07 MED ORDER — HYDROCODONE-ACETAMINOPHEN 7.5-325 MG PO TABS: 1.0000 | ORAL_TABLET | Freq: Once | ORAL | Status: DC | PRN

## 2016-10-07 MED ORDER — LIDOCAINE-EPINEPHRINE (PF) 2 %-1:200000 IJ SOLN
INTRAMUSCULAR | Status: DC | PRN
  Administered 2016-10-07: 7.5 mL

## 2016-10-07 MED ORDER — THROMBIN 5000 UNITS EX SOLR
CUTANEOUS | Status: AC
  Filled 2016-10-07: qty 15000

## 2016-10-07 MED ORDER — PROPOFOL 10 MG/ML IV BOLUS
INTRAVENOUS | Status: DC | PRN
  Administered 2016-10-07: 200 mg via INTRAVENOUS

## 2016-10-07 MED ORDER — GABAPENTIN 300 MG PO CAPS
300.0000 mg | ORAL_CAPSULE | Freq: Three times a day (TID) | ORAL | Status: DC
  Administered 2016-10-07 – 2016-10-08 (×2): 300 mg via ORAL
  Filled 2016-10-07 (×2): qty 1

## 2016-10-07 MED ORDER — CHLORHEXIDINE GLUCONATE CLOTH 2 % EX PADS: 6.0000 | MEDICATED_PAD | Freq: Once | CUTANEOUS | Status: DC

## 2016-10-07 MED ORDER — SODIUM CHLORIDE 0.9 % IJ SOLN
INTRAMUSCULAR | Status: AC
  Filled 2016-10-07: qty 20

## 2016-10-07 MED ORDER — SODIUM CHLORIDE 0.9% FLUSH
3.0000 mL | Freq: Two times a day (BID) | INTRAVENOUS | Status: DC
  Administered 2016-10-08: 3 mL via INTRAVENOUS

## 2016-10-07 MED ORDER — LIDOCAINE-EPINEPHRINE (PF) 2 %-1:200000 IJ SOLN
INTRAMUSCULAR | Status: AC
  Filled 2016-10-07: qty 20

## 2016-10-07 MED ORDER — ROCURONIUM BROMIDE 100 MG/10ML IV SOLN
INTRAVENOUS | Status: DC | PRN
  Administered 2016-10-07: 50 mg via INTRAVENOUS

## 2016-10-07 MED ORDER — BISACODYL 10 MG RE SUPP: 10.0000 mg | Freq: Every day | RECTAL | Status: DC | PRN

## 2016-10-07 MED ORDER — MENTHOL 3 MG MT LOZG: 1.0000 | LOZENGE | OROMUCOSAL | Status: DC | PRN

## 2016-10-07 MED ORDER — SODIUM CHLORIDE 0.9 % IV SOLN: 250.0000 mL | INTRAVENOUS | Status: DC

## 2016-10-07 MED ORDER — BUPIVACAINE HCL (PF) 0.25 % IJ SOLN
INTRAMUSCULAR | Status: DC | PRN
  Administered 2016-10-07: 1 mL

## 2016-10-07 MED ORDER — 0.9 % SODIUM CHLORIDE (POUR BTL) OPTIME
TOPICAL | Status: DC | PRN
  Administered 2016-10-07: 1000 mL

## 2016-10-07 MED ORDER — METOCLOPRAMIDE HCL 5 MG/ML IJ SOLN: 10.0000 mg | Freq: Once | INTRAMUSCULAR | Status: DC | PRN

## 2016-10-07 MED ORDER — KETOROLAC TROMETHAMINE 30 MG/ML IJ SOLN
INTRAMUSCULAR | Status: AC
  Filled 2016-10-07: qty 1

## 2016-10-07 MED ORDER — METHOCARBAMOL 1000 MG/10ML IJ SOLN
500.0000 mg | Freq: Four times a day (QID) | INTRAVENOUS | Status: DC | PRN
  Filled 2016-10-07: qty 5

## 2016-10-07 MED ORDER — MEPERIDINE HCL 25 MG/ML IJ SOLN: 6.2500 mg | INTRAMUSCULAR | Status: DC | PRN

## 2016-10-07 MED ORDER — BUPIVACAINE LIPOSOME 1.3 % IJ SUSP
20.0000 mL | Freq: Once | INTRAMUSCULAR | Status: DC
  Filled 2016-10-07: qty 20

## 2016-10-07 MED ORDER — CEFAZOLIN SODIUM-DEXTROSE 2-4 GM/100ML-% IV SOLN
2.0000 g | Freq: Three times a day (TID) | INTRAVENOUS | Status: AC
  Administered 2016-10-08: 2 g via INTRAVENOUS
  Filled 2016-10-07 (×2): qty 100

## 2016-10-07 MED ORDER — METHOCARBAMOL 500 MG PO TABS
1000.0000 mg | ORAL_TABLET | Freq: Four times a day (QID) | ORAL | Status: DC | PRN
  Administered 2016-10-08: 1000 mg via ORAL
  Filled 2016-10-07: qty 2

## 2016-10-07 MED ORDER — MIDAZOLAM HCL 2 MG/2ML IJ SOLN
INTRAMUSCULAR | Status: AC
  Filled 2016-10-07: qty 2

## 2016-10-07 MED ORDER — BUPIVACAINE LIPOSOME 1.3 % IJ SUSP
INTRAMUSCULAR | Status: DC | PRN
  Administered 2016-10-07: 20 mL

## 2016-10-07 MED ORDER — PHENOL 1.4 % MT LIQD: 1.0000 | OROMUCOSAL | Status: DC | PRN

## 2016-10-07 MED ORDER — BUPIVACAINE HCL (PF) 0.25 % IJ SOLN
INTRAMUSCULAR | Status: AC
  Filled 2016-10-07: qty 30

## 2016-10-07 MED ORDER — FENTANYL CITRATE (PF) 100 MCG/2ML IJ SOLN
INTRAMUSCULAR | Status: AC
  Filled 2016-10-07: qty 4

## 2016-10-07 MED ORDER — BUPIVACAINE-EPINEPHRINE (PF) 0.5% -1:200000 IJ SOLN
INTRAMUSCULAR | Status: DC | PRN
  Administered 2016-10-07: 7.5 mL

## 2016-10-07 MED ORDER — HYDROMORPHONE HCL 1 MG/ML IJ SOLN
0.5000 mg | INTRAMUSCULAR | Status: DC | PRN
  Administered 2016-10-07 – 2016-10-08 (×6): 0.5 mg via INTRAVENOUS
  Filled 2016-10-07 (×6): qty 1

## 2016-10-07 MED ORDER — HYDROMORPHONE HCL 1 MG/ML IJ SOLN
0.2500 mg | INTRAMUSCULAR | Status: DC | PRN
  Administered 2016-10-07 (×2): 0.5 mg via INTRAVENOUS

## 2016-10-07 MED ORDER — MUPIROCIN 2 % EX OINT
TOPICAL_OINTMENT | CUTANEOUS | Status: AC
  Administered 2016-10-07: 1 via TOPICAL
  Filled 2016-10-07: qty 22

## 2016-10-07 MED ORDER — CEFAZOLIN SODIUM-DEXTROSE 2-4 GM/100ML-% IV SOLN
2.0000 g | INTRAVENOUS | Status: AC
  Administered 2016-10-07: 2 g via INTRAVENOUS

## 2016-10-07 MED ORDER — BUPIVACAINE-EPINEPHRINE (PF) 0.5% -1:200000 IJ SOLN
INTRAMUSCULAR | Status: AC
  Filled 2016-10-07: qty 30

## 2016-10-07 MED ORDER — DOCUSATE SODIUM 100 MG PO CAPS
100.0000 mg | ORAL_CAPSULE | Freq: Two times a day (BID) | ORAL | Status: DC
  Administered 2016-10-07 – 2016-10-08 (×2): 100 mg via ORAL
  Filled 2016-10-07 (×2): qty 1

## 2016-10-07 MED ORDER — ONDANSETRON HCL 4 MG/2ML IJ SOLN
4.0000 mg | INTRAMUSCULAR | Status: DC | PRN
  Administered 2016-10-08: 4 mg via INTRAVENOUS
  Filled 2016-10-07: qty 2

## 2016-10-07 MED ORDER — SODIUM CHLORIDE 0.9 % IV SOLN: INTRAVENOUS | Status: DC

## 2016-10-07 MED ORDER — LACTATED RINGERS IV SOLN
INTRAVENOUS | Status: DC
  Administered 2016-10-07: 13:00:00 via INTRAVENOUS

## 2016-10-07 MED ORDER — CELECOXIB 200 MG PO CAPS: 200.0000 mg | ORAL_CAPSULE | Freq: Two times a day (BID) | ORAL | Status: DC

## 2016-10-07 MED ORDER — METHOCARBAMOL 500 MG PO TABS
ORAL_TABLET | ORAL | Status: AC
  Filled 2016-10-07: qty 1

## 2016-10-07 MED ORDER — HYDROCODONE-ACETAMINOPHEN 7.5-325 MG PO TABS
1.0000 | ORAL_TABLET | ORAL | Status: DC | PRN
  Administered 2016-10-07 – 2016-10-08 (×2): 2 via ORAL
  Filled 2016-10-07 (×3): qty 2

## 2016-10-07 MED ORDER — FENTANYL CITRATE (PF) 100 MCG/2ML IJ SOLN
INTRAMUSCULAR | Status: DC | PRN
  Administered 2016-10-07: 50 ug via INTRAVENOUS
  Administered 2016-10-07: 200 ug via INTRAVENOUS
  Administered 2016-10-07 (×3): 50 ug via INTRAVENOUS

## 2016-10-07 SURGICAL SUPPLY — 64 items
BAG DECANTER FOR FLEXI CONT (MISCELLANEOUS) ×2 IMPLANT
BENZOIN TINCTURE PRP APPL 2/3 (GAUZE/BANDAGES/DRESSINGS) IMPLANT
BIT DRILL NEURO 2X3.1 SFT TUCH (MISCELLANEOUS) ×1 IMPLANT
BLADE CLIPPER SURG (BLADE) IMPLANT
BLADE SURG 11 STRL SS (BLADE) ×2 IMPLANT
BUR ROUND FLUTED 5 RND (BURR) ×2 IMPLANT
CANISTER SUCT 3000ML PPV (MISCELLANEOUS) ×4 IMPLANT
CARTRIDGE OIL MAESTRO DRILL (MISCELLANEOUS) ×1 IMPLANT
CHLORAPREP W/TINT 26ML (MISCELLANEOUS) ×2 IMPLANT
CONT SPEC 4OZ CLIKSEAL STRL BL (MISCELLANEOUS) ×2 IMPLANT
DECANTER SPIKE VIAL GLASS SM (MISCELLANEOUS) ×2 IMPLANT
DERMABOND ADVANCED (GAUZE/BANDAGES/DRESSINGS) ×1
DERMABOND ADVANCED .7 DNX12 (GAUZE/BANDAGES/DRESSINGS) ×1 IMPLANT
DIFFUSER DRILL AIR PNEUMATIC (MISCELLANEOUS) ×2 IMPLANT
DRAPE MICROSCOPE LEICA (MISCELLANEOUS) ×2 IMPLANT
DRAPE POUCH INSTRU U-SHP 10X18 (DRAPES) ×2 IMPLANT
DRAPE SURG 17X23 STRL (DRAPES) ×2 IMPLANT
DRILL NEURO 2X3.1 SOFT TOUCH (MISCELLANEOUS) ×2
DRSG OPSITE POSTOP 3X4 (GAUZE/BANDAGES/DRESSINGS) ×2 IMPLANT
ELECT REM PT RETURN 9FT ADLT (ELECTROSURGICAL) ×2
ELECTRODE REM PT RTRN 9FT ADLT (ELECTROSURGICAL) ×1 IMPLANT
GAUZE SPONGE 4X4 12PLY STRL (GAUZE/BANDAGES/DRESSINGS) IMPLANT
GAUZE SPONGE 4X4 16PLY XRAY LF (GAUZE/BANDAGES/DRESSINGS) IMPLANT
GLOVE BIO SURGEON STRL SZ7.5 (GLOVE) ×2 IMPLANT
GLOVE BIOGEL PI IND STRL 7.0 (GLOVE) ×2 IMPLANT
GLOVE BIOGEL PI IND STRL 7.5 (GLOVE) ×4 IMPLANT
GLOVE BIOGEL PI INDICATOR 7.0 (GLOVE) ×2
GLOVE BIOGEL PI INDICATOR 7.5 (GLOVE) ×4
GLOVE SS BIOGEL STRL SZ 7.5 (GLOVE) ×2 IMPLANT
GLOVE SUPERSENSE BIOGEL SZ 7.5 (GLOVE) ×2
GLOVE SURG SS PI 7.0 STRL IVOR (GLOVE) ×8 IMPLANT
GOWN STRL REUS W/ TWL LRG LVL3 (GOWN DISPOSABLE) ×2 IMPLANT
GOWN STRL REUS W/ TWL XL LVL3 (GOWN DISPOSABLE) ×2 IMPLANT
GOWN STRL REUS W/TWL LRG LVL3 (GOWN DISPOSABLE) ×2
GOWN STRL REUS W/TWL XL LVL3 (GOWN DISPOSABLE) ×2
HEMOSTAT POWDER KIT SURGIFOAM (HEMOSTASIS) ×2 IMPLANT
KIT BASIN OR (CUSTOM PROCEDURE TRAY) ×2 IMPLANT
KIT ROOM TURNOVER OR (KITS) ×2 IMPLANT
NEEDLE HYPO 18GX1.5 BLUNT FILL (NEEDLE) ×2 IMPLANT
NEEDLE HYPO 21X1.5 SAFETY (NEEDLE) ×4 IMPLANT
NEEDLE HYPO 25X1 1.5 SAFETY (NEEDLE) IMPLANT
NS IRRIG 1000ML POUR BTL (IV SOLUTION) ×2 IMPLANT
OIL CARTRIDGE MAESTRO DRILL (MISCELLANEOUS) ×2
PACK LAMINECTOMY NEURO (CUSTOM PROCEDURE TRAY) ×2 IMPLANT
PACK UNIVERSAL I (CUSTOM PROCEDURE TRAY) ×2 IMPLANT
PAD ARMBOARD 7.5X6 YLW CONV (MISCELLANEOUS) ×6 IMPLANT
PATTIES SURGICAL .5X1.5 (GAUZE/BANDAGES/DRESSINGS) ×2 IMPLANT
RUBBERBAND STERILE (MISCELLANEOUS) ×4 IMPLANT
SPONGE NEURO XRAY DETECT 1X3 (DISPOSABLE) IMPLANT
SPONGE SURGIFOAM ABS GEL SZ50 (HEMOSTASIS) ×2 IMPLANT
STRIP CLOSURE SKIN 1/2X4 (GAUZE/BANDAGES/DRESSINGS) IMPLANT
SUT STRATAFIX MNCRL+ 3-0 PS-2 (SUTURE) ×1
SUT STRATAFIX MONOCRYL 3-0 (SUTURE) ×1
SUT VIC AB 0 CT1 18XCR BRD8 (SUTURE) ×1 IMPLANT
SUT VIC AB 0 CT1 8-18 (SUTURE) ×1
SUT VIC AB 2-0 CT1 18 (SUTURE) ×2 IMPLANT
SUT VIC AB 4-0 PS2 27 (SUTURE) IMPLANT
SUTURE STRATFX MNCRL+ 3-0 PS-2 (SUTURE) ×1 IMPLANT
SYR 30ML LL (SYRINGE) ×4 IMPLANT
SYR 5ML LL (SYRINGE) ×2 IMPLANT
TOWEL OR 17X24 6PK STRL BLUE (TOWEL DISPOSABLE) IMPLANT
TOWEL OR 17X26 10 PK STRL BLUE (TOWEL DISPOSABLE) ×2 IMPLANT
TUBE CONNECTING 12X1/4 (SUCTIONS) IMPLANT
WATER STERILE IRR 1000ML POUR (IV SOLUTION) ×2 IMPLANT

## 2016-10-07 NOTE — Brief Op Note (Signed)
10/07/2016  6:03 PM  PATIENT:  Ana Pace  36 y.o. female  PRE-OPERATIVE DIAGNOSIS:  Herniated nucleus pulposis of lumbosacral region  POST-OPERATIVE DIAGNOSIS:  Herniated nucleus pulposis of lumbosacral region  PROCEDURE:  Procedure(s): Right Lumbar three-four Microdiscectomy (Right)  SURGEON:  Surgeon(s) and Role:    * Truitt MerleBenjamin Jared Needham Biggins, MD - Primary  PHYSICIAN ASSISTANT: Cindra PresumeVincent Costella, PA-C  ASSISTANTS: Cindra PresumeVincent Costella, PA-C  ANESTHESIA:   local and general  EBL:  Total I/O In: 1700 [I.V.:1700] Out: 150 [Blood:150]  BLOOD ADMINISTERED:none  DRAINS: none   LOCAL MEDICATIONS USED:  MARCAINE    and LIDOCAINE   SPECIMEN:  No Specimen  DISPOSITION OF SPECIMEN:  N/A  COUNTS:  YES  TOURNIQUET:  * No tourniquets in log *  DICTATION: .Dragon Dictation  PLAN OF CARE: Admit to inpatient   PATIENT DISPOSITION:  PACU - hemodynamically stable.   Delay start of Pharmacological VTE agent (>24hrs) due to surgical blood loss or risk of bleeding: yes

## 2016-10-07 NOTE — Transfer of Care (Signed)
Immediate Anesthesia Transfer of Care Note  Patient: Sherrilyn RistJackie K Kalan  Procedure(s) Performed: Procedure(s): Right Lumbar three-four Microdiscectomy (Right)  Patient Location: PACU  Anesthesia Type:General  Level of Consciousness: awake, alert  and oriented  Airway & Oxygen Therapy: Patient Spontanous Breathing and Patient connected to nasal cannula oxygen  Post-op Assessment: Report given to RN and Post -op Vital signs reviewed and stable  Post vital signs: Reviewed and stable  Last Vitals:  Vitals:   10/07/16 1244  BP: 113/88  Pulse: 99  Resp: 20  Temp: 36.9 C    Last Pain:  Vitals:   10/07/16 1245  TempSrc:   PainSc: 10-Worst pain ever      Patients Stated Pain Goal: 4 (10/07/16 1245)  Complications: No apparent anesthesia complications

## 2016-10-07 NOTE — Progress Notes (Signed)
Patient admitted from PACU. Patient alert and oriented x 4. Patient oriented to room and made comfortable. 

## 2016-10-07 NOTE — Anesthesia Procedure Notes (Signed)
Procedure Name: Intubation Date/Time: 10/07/2016 4:45 PM Performed by: Eligha Bridegroom Pre-anesthesia Checklist: Patient identified, Emergency Drugs available, Suction available, Patient being monitored and Timeout performed Patient Re-evaluated:Patient Re-evaluated prior to inductionOxygen Delivery Method: Circle system utilized Preoxygenation: Pre-oxygenation with 100% oxygen Intubation Type: IV induction Ventilation: Mask ventilation without difficulty Laryngoscope Size: Mac and 4 Grade View: Grade I Tube type: Oral Tube size: 7.0 mm Number of attempts: 1 Placement Confirmation: ETT inserted through vocal cords under direct vision,  positive ETCO2 and breath sounds checked- equal and bilateral Secured at: 21 cm Tube secured with: Tape Dental Injury: Teeth and Oropharynx as per pre-operative assessment

## 2016-10-07 NOTE — Anesthesia Preprocedure Evaluation (Signed)
Anesthesia Evaluation  Patient identified by MRN, date of birth, ID band Patient awake    Reviewed: Allergy & Precautions, NPO status , Patient's Chart, lab work & pertinent test results  Airway Mallampati: II  TM Distance: >3 FB Neck ROM: Full    Dental no notable dental hx. 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   Abdominal (+) + obese,   Peds  Hematology negative hematology ROS (+)   Anesthesia Other Findings   Reproductive/Obstetrics negative OB ROS                             Anesthesia Physical Anesthesia Plan  ASA: II  Anesthesia Plan: General   Post-op Pain Management:    Induction: Intravenous  Airway Management Planned: Oral ETT  Additional Equipment:   Intra-op Plan:   Post-operative Plan: Extubation in OR  Informed Consent: I have reviewed the patients History and Physical, chart, labs and discussed the procedure including the risks, benefits and alternatives for the proposed anesthesia with the patient or authorized representative who has indicated his/her understanding and acceptance.   Dental advisory given  Plan Discussed with: CRNA, Anesthesiologist and Surgeon  Anesthesia Plan Comments:         Anesthesia Quick Evaluation

## 2016-10-07 NOTE — Anesthesia Postprocedure Evaluation (Signed)
Anesthesia Post Note  Patient: Ana Pace  Procedure(s) Performed: Procedure(s) (LRB): Right Lumbar three-four Microdiscectomy (Right)  Patient location during evaluation: PACU Anesthesia Type: General Level of consciousness: awake and alert Pain management: pain level controlled Vital Signs Assessment: post-procedure vital signs reviewed and stable Respiratory status: spontaneous breathing, nonlabored ventilation, respiratory function stable and patient connected to nasal cannula oxygen Cardiovascular status: blood pressure returned to baseline and stable Postop Assessment: no signs of nausea or vomiting Anesthetic complications: no       Last Vitals:  Vitals:   10/07/16 1920 10/07/16 1935  BP: (!) 125/94 125/85  Pulse: 98 (!) 105  Resp: (!) 7 (!) 7  Temp:  36.2 C    Last Pain:  Vitals:   10/07/16 1935  TempSrc:   PainSc: 5                  Lashanna Angelo DAVID

## 2016-10-07 NOTE — Progress Notes (Signed)
No new issues since yesterday All questions answered Ready for the OR

## 2016-10-07 NOTE — H&P (Signed)
Patient ID:   [id number] Patient: Ana Pace  Date of Birth: 1981/02/10 Visit Type: Consult   Date: 10/06/2016 09:30 AM Provider: Hulan Saas MD    This 36 year old female presents for Back pain.  History of Present Illness: 1.  Back pain    [facility] of back and right leg pain.  She has had pain for the last 6 months but 2 weeks ago began worsening.  She reports a severe burning pain from her low back down her right leg mostly around her knee.  She reports that over the last 2 weeks she has had a hard time knowing if she needs to urinate.  She has not had problems with loss of bowel continence.  She complains of numbness and tingling.  She has taken and Percocet, diclofenac, Flexeril, prednisone, and gabapentin.       PAST MEDICAL/SURGICAL HISTORY   (Detailed)  Disease/disorder Onset Date Management Date Comments    C-section  CRR 10/06/2016 -    Lithotripsy  CRR 10/06/2016 -    Laparoscopy for endometriosis  CRR 10/06/2016 -    Cholecystectomy    Depression         Family History  (Detailed) Relationship Family Member Name Deceased Age at Death Condition Onset Age Cause of Death  Maternal grandmother    Hypertension  N    SOCIAL HISTORY  (Detailed) Tobacco use reviewed. Preferred language is [place].   Smoking status: Never smoker.  SMOKING STATUS Use Status Type Smoking Status Usage Per Day Years Used Total Pack Years  no/never  Never smoker       HOME ENVIRONMENT/SAFETY The patient has not fallen in the last year.        MEDICATIONS(added, continued or stopped this visit): Started Medication Directions Instruction Stopped   cyclobenzaprine 10 mg tablet take 1 tablet by oral route 2 times every day     diclofenac sodium 75 mg tablet,delayed release take 1 tablet by oral route 2 times every day     gabapentin 300 mg capsule take 1 capsule by oral route  every day at bedtime      oxycodone-acetaminophen 5 mg-325 mg tablet take 1 tablet by oral route  every 4-6 hours as needed     prednisone take 1 tablet by oral route  every day       ALLERGIES: Ingredient Reaction Medication Name Comment  CODEINE Hives    MORPHINE Hives     Reviewed, updated.    Vitals Date Temp F BP Pulse Ht In Wt Lb BMI BSA Pain Score  10/06/2016  128/87 109 68 236 35.88  10/10     PHYSICAL EXAM General Level of Distress: no acute distress Overall Appearance: normal  Head and Face  Right Left  Fundoscopic Exam:  no papilledema no papilledema    Cardiovascular Cardiac: regular rate and rhythm without murmur  Respiratory Lungs: normal, clear to auscultation  Neurological Orientation: normal Attention Span and Concentration:   normal Language: normal  Musculoskeletal Gait and Station: normal  Right Left Upper Extremity Muscle Strength: normal normal Lower Extremity Muscle Strength: normal normal Upper Extremity Muscle Tone:  normal normal Lower Extremity Muscle Tone: normal normal  Motor Strength Upper and lower extremity motor strength was tested in the clinically pertinent muscles. Any abnormal findings will be noted below.   Right Left Tib Anterior: 4/5  EHL: 4/5   Gaze Normal Horizontal Gaze Stability: normal Vertical Gaze Stability:  normal  Near Point Convergence: normal  Deep Tendon Reflexes  Right Left Biceps: normal normal Triceps: normal normal Brachioradialis: normal normal Patellar: normal normal Achilles: normal normal  Sensory Sensation was tested at C2 to T1 and L1 to S1.   Cranial Nerves II. Optic Nerve/Visual Fields: normal III. Oculomotor: normal IV. Trochlear: normal V. Trigeminal: normal VI. Abducens: normal VII. Facial: normal VIII. Acoustic/Vestibular: normal IX. Glossopharyngeal: normal X. Vagus: normal XI. Spinal Accessory: normal XII. Hypoglossal: normal  Motor and other  Tests    Right Left Hoffman's: normal normal Clonus: normal normal     DIAGNOSTIC RESULTS I have independently reviewed a noncontrast lumbar MRI.  She has mild spondylosis throughout her lumbar spine.  She has a broad-based disc herniation at L3-4 which is eccentric to the right.    IMPRESSION Ana Pace has a herniated nucleus pulposis at L3-4 with lumbar radiculopathy.  She has neurological deficits as described above.  I have recommended right L3-4 microdiskectomy.  We had a long discussion, in language she understands, about the details of the procedure and its risks and benefits.  We also discussed alternatives to the procedure and the risks and benefits of those.  She asked appropriate questions and wishes to proceed with surgery.  I am hopeful that we can get this scheduled for tomorrow because of her degree of pain and disability.  Completed Orders (this encounter) Order Details Reason Side Interpretation Result Initial Treatment Date Region  Dietary management education, guidance, and counseling Eat a well rounded diet high in fruit and vegetables         Assessment/Plan # Detail Type Description   1. 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       2. Assessment Herniated nucleus pulposis of lumbosacral region (M51.27).       3. Assessment Lumbar radiculopathy (M54.16).       4. Assessment Body mass index (BMI) 35.0-35.9, adult (Z68.35).   Plan Orders Today's instructions / counseling include(s) Dietary management education, guidance, and counseling.         Pain Assessment/Treatment Pain Scale: 10/10. Method: Numeric Pain Intensity Scale. Location: Back. Onset: 08/15/2016. Duration: varies. Quality: discomforting. Pain Assessment/Treatment follow-up plan of care: Patient is to use pain medication as directed.  Fall Risk Plan The patient has not fallen in the last year.  As above  Orders: Instruction(s)/Education: Assessment  Instruction  Z68.35 Dietary management education, guidance, and counseling           Provider:  Jamse ArnBenjamin J. Ditty MD  10/07/2016 2:00pm

## 2016-10-08 ENCOUNTER — Encounter (HOSPITAL_COMMUNITY): Payer: Self-pay | Admitting: Neurological Surgery

## 2016-10-08 LAB — BASIC METABOLIC PANEL
Anion gap: 10 (ref 5–15)
BUN: 11 mg/dL (ref 6–20)
CO2: 26 mmol/L (ref 22–32)
Calcium: 8.6 mg/dL — ABNORMAL LOW (ref 8.9–10.3)
Chloride: 101 mmol/L (ref 101–111)
Creatinine, Ser: 0.88 mg/dL (ref 0.44–1.00)
GFR calc Af Amer: >60 mL/min (ref >60)
GFR calc non Af Amer: >60 mL/min (ref >60)
Glucose, Bld: 101 mg/dL — ABNORMAL HIGH (ref 65–99)
Potassium: 4 mmol/L (ref 3.5–5.1)
Sodium: 137 mmol/L (ref 135–145)

## 2016-10-08 LAB — CBC
HCT: 33.3 % — ABNORMAL LOW (ref 36.0–46.0)
Hemoglobin: 10.9 g/dL — ABNORMAL LOW (ref 12.0–15.0)
MCH: 30.1 pg (ref 26.0–34.0)
MCHC: 32.7 g/dL (ref 30.0–36.0)
MCV: 92 fL (ref 78.0–100.0)
Platelets: 226 10*3/uL (ref 150–400)
RBC: 3.62 MIL/uL — ABNORMAL LOW (ref 3.87–5.11)
RDW: 13.7 % (ref 11.5–15.5)
WBC: 17.3 10*3/uL — ABNORMAL HIGH (ref 4.0–10.5)

## 2016-10-08 LAB — APTT: aPTT: 27 s (ref 24–36)

## 2016-10-08 LAB — PROTIME-INR
INR: 0.93
Prothrombin Time: 12.4 seconds (ref 11.4–15.2)

## 2016-10-08 MED ORDER — OXYCODONE-ACETAMINOPHEN 5-325 MG PO TABS: 1.0000 | ORAL_TABLET | ORAL | 0 refills | Status: DC | PRN

## 2016-10-08 MED ORDER — GABAPENTIN 300 MG PO CAPS: 300.0000 mg | ORAL_CAPSULE | Freq: Three times a day (TID) | ORAL | 1 refills | Status: DC

## 2016-10-08 NOTE — Discharge Summary (Signed)
Date of Admission: 10/07/2016  Date of Discharge: 10/08/16   PRE-OPERATIVE DIAGNOSIS:  Herniated nucleus pulposis of lumbosacral region  POST-OPERATIVE DIAGNOSIS:  Herniated nucleus pulposis of lumbosacral region  PROCEDURE:  Procedure(s): Right Lumbar three-four Microdiscectomy (Right)  Attending: Loura HaltBenjamin Jared Ditty, MD  Hospital Course:  The patient was admitted for the above listed operation and had an uncomplicated post-operative course.  They were discharged in stable condition. Able to move all extremities. Radicular symptoms have resolved. Tolerating po. Able to ambulate without assistance. Ready to go home. Incision without drainage or signs of infection.  Follow up: 3 weeks  Allergies as of 10/08/2016      Reactions   Morphine And Related Itching, Nausea And Vomiting   Codeine Itching, Nausea And Vomiting      Medication List    STOP taking these medications   cyclobenzaprine 10 MG tablet Commonly known as:  FLEXERIL   HYDROcodone-acetaminophen 5-325 MG tablet Commonly known as:  NORCO/VICODIN   predniSONE 20 MG tablet Commonly known as:  DELTASONE     TAKE these medications   diazepam 5 MG tablet Commonly known as:  VALIUM Take 1 tablet (5 mg total) by mouth every 8 (eight) hours as needed for muscle spasms.   diclofenac 25 MG EC tablet Commonly known as:  VOLTAREN Take 25 mg by mouth 2 (two) times daily.   gabapentin 300 MG capsule Commonly known as:  NEURONTIN Take 1 capsule (300 mg total) by mouth 3 (three) times daily.   methocarbamol 500 MG tablet Commonly known as:  ROBAXIN Take 2 tablets (1,000 mg total) by mouth 4 (four) times daily as needed for muscle spasms (muscle spasm/pain).   ondansetron 8 MG disintegrating tablet Commonly known as:  ZOFRAN-ODT Take 8 mg by mouth every 6 (six) hours as needed for nausea/vomiting.   oxyCODONE-acetaminophen 5-325 MG tablet Commonly known as:  PERCOCET/ROXICET Take 1-2 tablets by mouth every 4  (four) hours as needed. What changed:  how much to take

## 2016-10-08 NOTE — Discharge Instructions (Signed)
Herniated Disk A herniated disk, also called a ruptured disk or slipped disk, occurs when a disk in the spine bulges out too far. Between the bones in the spine (vertebrae), there are oval disks that are made of a soft, spongy center that is surrounded by a tough outer ring. The disks connect your vertebrae, help your spine move, and absorb shocks from your movement. When you have a herniated disk, the spongy center of the disk bulges out or breaks through the outer ring. It can press on a nerve between the vertebrae and cause pain. This can occur anywhere in the back or neck area, but the lower back is most commonly affected. What are the causes? This condition may be caused by:  Age-related wear and tear. The spongy centers of spinal disks tend to shrink and dry out with age, which makes them more likely to herniate.  Sudden injury, such as a strain or sprain. What increases the risk? Aging is the main risk factor for a herniated disk. Other risk factors include:  Being a man who is 22-20 years old.  Frequently doing activities that involve heavy lifting, bending, or twisting.  Frequently driving for long hours at a time.  Not getting enough exercise.  Being overweight.  Smoking.  Having a family history of back problems or herniated disks.  Being pregnant or giving birth.  Having poor nutrition.  Being tall. What are the signs or symptoms? Symptoms may vary depending on where your herniated disk is located.  A herniated disk in the lower back may cause sharp pain in:  Part of the arm, leg, hip, or buttocks.  The back of the lower leg (calf).  The lower back, spreading down through the leg into the foot (sciatica).  A herniated disk in the neck may cause dizziness and vertigo. It may also cause pain or weakness in:  The neck.  The shoulder blades.  Upper arm, forearm, or fingers.  You may also have muscle weakness. It may be difficult to:  Lift your leg or  arm.  Stand on your toes.  Squeeze tightly with one of your hands.  Other symptoms may include:  Numbness or tingling in the affected areas of the hands, arms, feet, or legs.  Inability to control when you urinate or when you have bowel movements. This is a rare but serious sign of a severe herniated disk in the lower back. How is this diagnosed? This condition may be diagnosed based on:  Your symptoms.  Your medical history.  A physical exam. The exam may include:  Straight-leg test. You will lie on your back while your health care provider lifts your leg, keeping your knee straight. If you feel pain, you likely have a herniated disk.  Neurological tests. This includes checking for numbness, reflexes, muscle strength, and posture.  Imaging tests, such as:  X-rays.  MRI.  CT scan.  Electromyogram (EMG) to check the nerves that control muscles. This test may be used to determine which nerves are affected by your herniated disk. How is this treated? Treatment for this condition may include:  A short period of rest. This is usually the first treatment.  You may be on bed rest for up to 2 days, or you may be instructed to stay home and avoid physical activity.  If you have a herniated disk in your lower back, avoid sitting as much as possible. Sitting increases pressure on the disk.  Medicines. These may include:  NSAIDs to  help reduce pain and swelling.  Muscle relaxants to prevent sudden tightening of the back muscles (back spasms).  Prescription pain medicines, if you have severe pain.  Steroid injections in the area of the herniated disk. This can help reduce pain and swelling.  Physical therapy to strengthen your back muscles. In many cases, symptoms go away with treatment over a period of days or weeks. You will most likely be free of symptoms after 3-4 months. If other treatments do not help to relieve your symptoms, you may need surgery. Follow these  instructions at home: Medicines  Take over-the-counter and prescription medicines only as told by your health care provider.  Do not drive or use heavy machinery while taking prescription pain medicine. Activity  Rest as directed.  After your rest period:  Return to your normal activities and gradually begin exercising as told by your health care provider. Ask your health care provider what activities and exercises are safe for you.  Use good posture.  Avoid movements that cause pain.  Do not lift anything that is heavier than 10 lb (4.5 kg) until your health care provider says this is safe.  Do not sit or stand for long periods of time without changing positions.  Do not sit for long periods of time without getting up and moving around.  If physical therapy was prescribed, do exercises as instructed.  Aim to strengthen muscles in your back and abdomen with exercises like crunches, swimming, or walking. General instructions  Do not use any products that contain nicotine or tobacco, such as cigarettes and e-cigarettes. These products can delay healing. If you need help quitting, ask your health care provider.  Do not wear high-heeled shoes.  Do not sleep on your belly.  If you are overweight, work with your health care provider to lose weight safely.  To prevent or treat constipation while you are taking prescription pain medicine, your health care provider may recommend that you:  Drink enough fluid to keep your urine clear or pale yellow.  Take over-the-counter or prescription medicines.  Eat foods that are high in fiber, such as fresh fruits and vegetables, whole grains, and beans.  Limit foods that are high in fat and processed sugars, such as fried and sweet foods.  Keep all follow-up visits as told by your health care provider. This is important. How is this prevented?  Maintain a healthy weight.  Try to avoid stressful situations.  Maintain physical  fitness. Do at least 150 minutes of moderate-intensity exercise each week, such as brisk walking or water aerobics.  When lifting objects:  Keep your feet at least shoulder-width apart and tighten your abdominal muscles.  Keep your spine neutral as you bend your knees and hips. It is important to lift using the strength of your legs, not your back. Do not lock your knees straight out.  Always ask for help to lift heavy or awkward objects. Contact a health care provider if:  You have back pain or neck pain that does not get better after 6 weeks.  You have severe pain in your back, neck, legs, or arms.  You develop numbness, tingling, or weakness in any part of your body. Get help right away if:  You cannot move your arms or legs.  You cannot control when you urinate or have bowel movements.  You feel dizzy or you faint.  You have shortness of breath. This information is not intended to replace advice given to you by your health  care provider. Make sure you discuss any questions you have with your health care provider. Document Released: 07/29/2000 Document Revised: 03/28/2016 Document Reviewed: 03/28/2016 Elsevier Interactive Patient Education  2017 [employer]  Spinal Fusion, Care After Refer to this sheet in the next few weeks. These instructions provide you with information about caring for yourself after your procedure. Your health care provider may also give you more specific instructions. Your treatment has been planned according to current medical practices, but problems sometimes occur. Call your health care provider if you have any problems or questions after your procedure. WHAT TO EXPECT AFTER THE PROCEDURE After your procedure, it is common to have:  Pain and stiffness in your back.  Pain around your incision. HOME CARE INSTRUCTIONS  Medicines  Take over-the-counter and prescription medicines only as told by your health care provider. These include any pain  medicines.  Do not drive for 24 hours if you received a sedative.  Do not drive or operate heavy machinery while taking prescription pain medicine.  If you were prescribed an antibiotic medicine, take it as told by your health care provider. Do not stop taking the antibiotic even if you start to feel better. Incision Care  Follow instructions from your health care provider about how to take care of your incision. Make sure you:  Wash your hands with soap and water before you change your bandage (dressing). If soap and water are not available, use hand sanitizer.  Change your dressing as told by your health care provider.  Leave stitches (sutures), skin glue, or adhesive strips in place. These skin closures may need to be in place for 2 weeks or longer. If adhesive strip edges start to loosen and curl up, you may trim the loose edges. Do not remove adhesive strips completely unless your health care provider tells you to do that.  Keep your incision clean and dry. Do not take baths, swim, or use a hot tub until your health care provider approves.  Check your incision and the surrounding area every day for redness, swelling, and leaking fluid. Physical Activity  Return to your normal activities as told by your health care provider. Ask your health care provider what activities are safe for you. Rest and protect your back as much as possible.  Follow instructions from your health care provider about how to move and use good posture to help your spine heal.  Do not lift anything that is heavier than 8 lb (3.6 kg)or the limit that your health care provider tells you until he or she says that it is safe. Avoid lifting anything over your head.  Do not twist or bend at the waist until your health care provider approves.  Avoid pushing and pulling motions.  Avoid sitting or lying down in the same position for long periods of time.  Do not begin exercising until told by your health care  provider. Ask your health care provider what kinds of exercise you can do to make your back stronger. General Instructions  If you were given a brace, use it as told by your health care provider.  Wear compression stockings as told by your health care provider. These stockings help to prevent blood clots and reduce swelling in your legs.  Do not use tobacco products, including cigarettes, chewing tobacco, or e-cigarettes. If you need help quitting, ask your health care provider.  Keep all follow-up visits as told by your health care provider and, if necessary, your physical therapist. This is  important. SEEK MEDICAL CARE IF:  You have pain that gets worse or does not get better with medicine.  Your legs or feet become painful or swollen.  You have redness, swelling, or pain at the site of your incision.  You have fluid, blood, or pus coming from your incision.  You vomit or feel nauseous.  You have weakness or numbness in your legs that is new or getting worse.  You have a fever.  You have trouble controlling urination or bowel movements. SEEK IMMEDIATE MEDICAL CARE IF:   You have severe pain.  You have chest pain.  You have trouble breathing.  You develop a cough. These symptoms may represent a serious problem that is an emergency. Do not wait to see if the symptoms will go away. Get medical help right away. Call your local emergency services (911 in the [place] Do not drive yourself to the hospital. This information is not intended to replace advice given to you by your health care provider. Make sure you discuss any questions you have with your health care provider. Document Released: 02/18/2005 Document Revised: 11/23/2015 Document Reviewed: 01/14/2015 Elsevier Interactive Patient Education  2017 [employer].

## 2016-10-08 NOTE — Op Note (Signed)
10/07/2016  9:55 AM  PATIENT:  Ana Pace  36 y.o. 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 Yannis Broce, MD  ASSISTANTS: Cindra PresumeVincent Costella, PA-C  ANESTHESIA:   General  DRAINS: None   SPECIMEN:  None  INDICATION FOR PROCEDURE: 36 year old female with pain and disability attributable to a herniated disc at L3-4.  I recommended the above operation. Patient understood the risks, benefits, and alternatives and potential outcomes and wished to proceed.  PROCEDURE DETAILS: After smooth induction of general endotracheal anesthesia the patient was turned prone on the operating table on a Wilson frame.  The skin of the lumbar region was wiped down with alcohol. The patient was then prepped and draped in usual sterile fashion.   A localizing xray was taken using a needle. The skin around this needle was infiltrated with a mixture of lidocaine and Marcaine with epinephrine. An approximately 2 inch incision was then made at this location.  Monopolar cautery was used to dissect through the subcutaneous tissues to the lumbodorsal fascia. Subperiosteal dissection was performed on the right side. The level was then confirmed and the retractor was placed.  The operating microscope was brought into the field. Using microsurgical technique the remaining adherent soft tissue was removed from the lamina. The high-speed drill was used to thin the inferior lamina of L3 and superior lamina of L4.  The hemilaminectomy was completed with Kerrison punches and pituitary rongeurs.  The ligamentum flavum was elevated with a nerve hook and then resected with Kerrison punches. Epidural fat was suctioned away to reveal the thecal sac. Residual ligament was resected.  The L4 nerve root was identified. This was medialized.  The disc space was identified.  The annulus was  opened sharply.  There was thick, fibrous disc material herniated into the spinal canal.  This was resected using pituitary rongeurs and Kerrisons. After this was complete I palpated the ventral edge of the thecal sac and nerve root to determine if there was no residual compression. I was satisfied that this was the case.  I irrigated vigorously with bacitracin saline. There was excellent hemostasis. I injected a mixture of plain Marcaine, Toradol, and Depo-Medrol into the epidural space.  I placed vancomycin powder in the depths of the wound.  The wound was closed in routine anatomic layers using interrupted Vicryl sutures. The skin was closed with an interrupted monocryl and sealed with Dermabond.  PATIENT DISPOSITION:  PACU - hemodynamically stable.   Delay start of Pharmacological VTE agent (>24hrs) due to surgical blood loss or risk of bleeding:  yes

## 2016-10-08 NOTE — Progress Notes (Signed)
Doing much better Right leg function resolved Able to detect bladder fullness Voiding normally Home today

## 2016-10-08 NOTE — Progress Notes (Signed)
Patient given discharge instructions.  All questions and concerns addressed.  Patient left unit by wheelchair accompanied by staff to private vehicle.

## 2016-10-08 NOTE — Evaluation (Signed)
Occupational Therapy Evaluation Patient Details Name: Ana Pace MRN: [id number] DOB: 02/16/1981 Today's Date: 10/08/2016    History of Present Illness This 36 y.o. female admitted for Rt L3-4 microdiscectomy due to HNP.  PMH non contributory   Clinical Impression   Patient evaluated by Occupational Therapy with no further acute OT needs identified. All education has been completed and the patient has no further questions. Pt demonstrates good understanding of back precautions.  She requires supervision for ADLs.  See below for any follow-up Occupational Therapy or equipment needs. OT is signing off. Thank you for this referral.        Follow Up Recommendations  No OT follow up;Supervision - Intermittent    Equipment Recommendations  None recommended by OT    Recommendations for Other Services       Precautions / Restrictions Precautions Precautions: Back Precaution Booklet Issued: Yes (comment) Precaution Comments: reviewed back handout.  Pt demonstrates good awareness of back precautions  Required Braces or Orthoses: Spinal Brace Spinal Brace: Lumbar corset;Applied in sitting position      Mobility Bed Mobility Overal bed mobility: Modified Independent             General bed mobility comments: Pt instructed in log rolling technique   Transfers Overall transfer level: Modified independent                    Balance Overall balance assessment: No apparent balance deficits (not formally assessed)                                          ADL Overall ADL's : Needs assistance/impaired Eating/Feeding: Independent   Grooming: Wash/dry hands;Wash/dry face;Oral care;Brushing hair;Modified independent;Standing Grooming Details (indicate cue type and reason): Pt instructed how to safely brush teeth without bending  Upper Body Bathing: Supervision/ safety;Min guard;Sitting;Standing   Lower Body Bathing: Supervison/ safety;Sit to/from  stand Lower Body Bathing Details (indicate cue type and reason): instructed to cross ankles over knees  Upper Body Dressing : Set up;Supervision/safety;Sitting;Standing   Lower Body Dressing: Set up;Supervision/safety;Sit to/from stand Lower Body Dressing Details (indicate cue type and reason): insructed to cross ankles over knees  Toilet Transfer: Modified Independent;Ambulation;Regular Toilet;Grab bars Toilet Transfer Details (indicate cue type and reason): has vanity next to commodes at home  Toileting- ArchitectClothing Manipulation and Hygiene: Supervision/safety;Sit to/from stand Toileting - Clothing Manipulation Details (indicate cue type and reason): able to access peri area withougt bending and twisting  Tub/ Shower Transfer: Walk-in shower;Supervision/safety;Ambulation;Shower seat   Functional mobility during ADLs: Supervision/safety General ADL Comments: Pt demonstrates good awareness of back precautions and she is very motivated      Financial controllerVision         Perception     Praxis      Pertinent Vitals/Pain Pain Assessment: 0-10 Pain Score: 2  Pain Location: incisional pain  Pain Descriptors / Indicators: Operative site guarding Pain Intervention(s): Monitored during session     Hand Dominance Right   Extremity/Trunk Assessment Upper Extremity Assessment Upper Extremity Assessment: Overall WFL for tasks assessed   Lower Extremity Assessment Lower Extremity Assessment: Defer to PT evaluation       Communication Communication Communication: No difficulties   Cognition Arousal/Alertness: Awake/alert Behavior During Therapy: WFL for tasks assessed/performed Overall Cognitive Status: Within Functional Limits for tasks assessed  General Comments  Pt reports she does not have back brace here at hospital - it is at her home.  Instructed her to wear brace when up per orders     Exercises       Shoulder Instructions      Home Living Family/patient  expects to be discharged to:: Private residence Living Arrangements: Spouse/significant other Available Help at Discharge: Family;Available 24 hours/day Type of Home: House Home Access: Stairs to enter [employer] of Steps: 1   Home Layout: Two level;1/2 bath on main level Alternate Level Stairs-Number of Steps: full flight    Bathroom Shower/Tub: Tub/shower unit;Walk-in shower Shower/tub characteristics: Engineer, building services: Standard     Home Equipment: Shower seat   Additional Comments: Pt will be staying between her home, her mother's and her grandmother's home.  When her s/o is working she will go to her grandmother's home with ramped entrance and walk in shower       Prior Functioning/Environment Level of Independence: Independent        Comments: Pt works as an Product/process development scientist full time         OT Problem List: Pain      OT Treatment/Interventions:      OT Goals(Current goals can be found in the care plan section) Acute Rehab OT Goals Patient Stated Goal: to get back to normal  OT Goal Formulation: All assessment and education complete, DC therapy  OT Frequency:     Barriers to D/C:            Co-evaluation              End of Session Nurse Communication: Mobility status  Activity Tolerance: Patient tolerated treatment well Patient left: in chair;with call bell/phone within reach  OT Visit Diagnosis: Pain                ADL either performed or assessed with clinical judgement  Time: 0910-0932 OT Time Calculation (min): 22 min Charges:  OT General Charges $OT Visit: 1 Procedure OT Evaluation $OT Eval Low Complexity: 1 Procedure G-Codes:     Ana Pace, OTR/L [phone]   Ana Pace M 10/08/2016, 9:51 AM

## 2016-10-08 NOTE — Evaluation (Signed)
Physical Therapy Evaluation Patient Details Name: Ana Pace MRN: [id number] DOB: [birth year] Today's Date: 10/08/2016   History of Present Illness  This 36 y.o. female admitted for Rt L3-4 microdiscectomy due to HNP.  PMH non contributory  Clinical Impression  Patient presents with mobility at mod I level except stairs.  Will need to follow up with physician regarding her plans to return to fitness.  Educated pt in therapeutic pilates class at [facility] outpatient rehab center.  No current recommendations for follow up PT.  Will sign off as planned d/c today.     Follow Up Recommendations No PT follow up    Equipment Recommendations  None recommended by PT    Recommendations for Other Services       Precautions / Restrictions Precautions Precautions: Back Precaution Booklet Issued: Yes (comment) Precaution Comments: reviewed back handout.  Pt demonstrates good awareness of back precautions  Required Braces or Orthoses: Spinal Brace Spinal Brace: Lumbar corset;Applied in sitting position (available at home, didn't bring to hospital)      Mobility  Bed Mobility Overal bed mobility: Modified Independent             General bed mobility comments: demonstrated in and out of bed with proper technique, no rail  Transfers Overall transfer level: Modified independent Equipment used: None                Ambulation/Gait Ambulation/Gait assistance: Independent Ambulation Distance (Feet): 160 Feet Assistive device: None Gait Pattern/deviations: Step-through pattern;WFL(Within Functional Limits)        Stairs Stairs: Yes Stairs assistance: Supervision Stair Management: Two rails;Step to pattern;Forwards Number of Stairs: 10 General stair comments: demonstrated with step to technique with cues   Wheelchair Mobility    Modified Rankin (Stroke Patients Only)       Balance Overall balance assessment: No apparent balance deficits (not formally assessed)                                            Pertinent Vitals/Pain Pain Assessment: Faces Pain Score: 2  Faces Pain Scale: Hurts little more Pain Location: incisional pain  Pain Descriptors / Indicators: Operative site guarding Pain Intervention(s): Monitored during session    Home Living Family/patient expects to be discharged to:: Private residence Living Arrangements: Spouse/significant other Available Help at Discharge: Family;Available 24 hours/day Type of Home: House Home Access: Stairs to enter   [employer] of Steps: 1 Home Layout: Two level;1/2 bath on main level Home Equipment: Shower seat Additional Comments: Pt will be staying between her home, her mother's and her grandmother's home.  When her s/o is working she will go to her grandmother's home with ramped entrance and walk in shower     Prior Function Level of Independence: Independent         Comments: Pt works as an Product/process development scientist full time      Higher education careers adviser   Dominant Hand: Right    Extremity/Trunk Assessment   Upper Extremity Assessment Upper Extremity Assessment: Overall WFL for tasks assessed    Lower Extremity Assessment Lower Extremity Assessment: Overall WFL for tasks assessed       Communication   Communication: No difficulties  Cognition Arousal/Alertness: Awake/alert Behavior During Therapy: WFL for tasks assessed/performed Overall Cognitive Status: Within Functional Limits for tasks assessed  General Comments General comments (skin integrity, edema, etc.): has her back brace at home; mobilizes well maintaining precautions, but plans to use brace at home when up    Exercises     Assessment/Plan    PT Assessment Patent does not need any further PT services  PT Problem List         PT Treatment Interventions      PT Goals (Current goals can be found in the Care Plan section)  Acute Rehab PT Goals Patient Stated Goal: to get back to  normal  PT Goal Formulation: All assessment and education complete, DC therapy    Frequency     Barriers to discharge        Co-evaluation               End of Session   Activity Tolerance: Patient tolerated treatment well Patient left: in chair;with call bell/phone within reach   PT Visit Diagnosis: Pain Pain - Right/Left: Right Pain - part of body: Leg (back, was in leg prior to surgery)         Time: [id number] PT Time Calculation (min) (ACUTE ONLY): 24 min   Charges:   PT Evaluation $PT Eval Moderate Complexity: 1 Procedure PT Treatments $Gait Training: 8-22 mins   PT G CodesElray Mcgregor:         Hamed Debella 10/08/2016, 10:34 AM  Sheran Lawlessyndi Lorice Lafave, PT [phone] 10/08/2016

## 2016-10-27 ENCOUNTER — Ambulatory Visit (INDEPENDENT_AMBULATORY_CARE_PROVIDER_SITE_OTHER): Payer: 59 | Admitting: Physician Assistant

## 2016-10-27 VITALS — BP 114/70 | HR 93 | Temp 98.4°F | Resp 18 | Ht 68.0 in | Wt 232.0 lb

## 2016-10-27 DIAGNOSIS — Z01818 Encounter for other preprocedural examination: Secondary | ICD-10-CM

## 2016-10-27 DIAGNOSIS — Z131 Encounter for screening for diabetes mellitus: Secondary | ICD-10-CM | POA: Diagnosis not present

## 2016-10-27 LAB — POCT CBC
Granulocyte percent: 74.6 %G (ref 37–80)
HEMATOCRIT: 38.6 % (ref 37.7–47.9)
HEMOGLOBIN: 13.4 g/dL (ref 12.2–16.2)
LYMPH, POC: 1.7 (ref 0.6–3.4)
MCH, POC: 31 pg (ref 27–31.2)
MCHC: 34.7 g/dL (ref 31.8–35.4)
MCV: 89.5 fL (ref 80–97)
MID (CBC): 0.5 (ref 0–0.9)
MPV: 7.3 fL (ref 0–99.8)
POC GRANULOCYTE: 6.7 (ref 2–6.9)
POC LYMPH %: 19.4 % (ref 10–50)
POC MID %: 6 % (ref 0–12)
Platelet Count, POC: 345 10*3/uL (ref 142–424)
RBC: 4.32 M/uL (ref 4.04–5.48)
RDW, POC: 13.2 %
WBC: 9 10*3/uL (ref 4.6–10.2)

## 2016-10-27 LAB — POCT URINALYSIS DIP (MANUAL ENTRY)
BILIRUBIN UA: NEGATIVE
Blood, UA: NEGATIVE
GLUCOSE UA: NEGATIVE
LEUKOCYTES UA: NEGATIVE
NITRITE UA: NEGATIVE
PH UA: 8.5 — AB
Protein Ur, POC: 30 — AB
Spec Grav, UA: 1.015
Urobilinogen, UA: 1

## 2016-10-27 LAB — POCT GLYCOSYLATED HEMOGLOBIN (HGB A1C): HEMOGLOBIN A1C: 4.6

## 2016-10-27 NOTE — Patient Instructions (Signed)
     IF you received an x-ray today, you will receive an invoice from [facility]. Please contact [facility] at [phone] with questions or concerns regarding your invoice.   IF you received labwork today, you will receive an invoice from [facility]. Please contact [facility] at [phone] with questions or concerns regarding your invoice.   Our billing staff will not be able to assist you with questions regarding bills from these companies.  You will be contacted with the lab results as soon as they are available. The fastest way to get your results is to activate your My Chart account. Instructions are located on the last page of this paperwork. If you have not heard from us regarding the results in 2 weeks, please contact this office.     

## 2016-10-27 NOTE — Progress Notes (Signed)
10/27/2016 1:52 PM   DOB: 1980-10-17 / MRN: [id number]  SUBJECTIVE:  Ana Pace is a 36 y.o. female presenting for surgical clearance for a "tummy tuck" in [place] next month. She feels well today and denies complaint.  No history of abnormal bleeding.She has had general anesthesia in the past and did not have any problems. She has some forms that she needs completed today.    She works as a Marine scientist at [employer].   She is allergic to morphine and related and codeine.   She  has a past medical history of Abnormal Pap smear; Anxiety; Endometrial hyperplasia without atypia, simple; Endometriosis; GERD (gastroesophageal reflux disease); Herniated nucleus pulposus of lumbosacral region; Kidney stone; and Ovarian cyst.    She  reports that she quit smoking about 9 years ago. Her smoking use included Cigarettes. She quit after 3.00 years of use. She has never used smokeless tobacco. She reports that she drinks alcohol. She reports that she does not use drugs. She  reports that she does not currently engage in sexual activity. She reports using the following method of birth control/protection: None. The patient  has a past surgical history that includes Cesarean section; Diagnostic laparoscopy; Cesarean section (04/02/2012); Cholecystectomy; Lithotripsy; and Lumbar laminectomy/decompression microdiscectomy (Right, 10/07/2016).  Her family history includes Hypertension in her mother.  Review of Systems  Constitutional: Negative for chills and fever.  Respiratory: Negative for cough.   Genitourinary: Negative for dysuria.  Musculoskeletal: Negative for myalgias.  Skin: Negative for itching and rash.  Neurological: Negative for dizziness.  Psychiatric/Behavioral: Negative for depression.    The problem list and medications were reviewed and updated by myself where necessary and exist elsewhere in the encounter.   OBJECTIVE:  BP 114/70   Pulse 93   Temp 98.4 F (36.9 C) (Oral)   Resp 18   Ht  5' 8"  (1.727 m)   Wt 232 lb (105.2 kg)   LMP 09/27/2016 (Exact Date)   SpO2 97%   BMI 35.28 kg/m   Physical Exam  Constitutional: She is oriented to person, place, and time. She appears well-developed and well-nourished. No distress.  HENT:  Right Ear: External ear normal.  Left Ear: External ear normal.  Nose: Nose normal.  Mouth/Throat: Oropharynx is clear and moist. No oropharyngeal exudate.  Eyes: Conjunctivae and EOM are normal. Pupils are equal, round, and reactive to light. Right eye exhibits no discharge. Left eye exhibits no discharge. No scleral icterus.  Neck: Normal range of motion. Neck supple. No JVD present. No tracheal deviation present. No thyromegaly present.  Cardiovascular: Normal rate, regular rhythm, normal heart sounds and intact distal pulses.  Exam reveals no gallop and no friction rub.   No murmur heard. Pulmonary/Chest: Effort normal and breath sounds normal. No stridor. No respiratory distress. She has no wheezes. She has no rales. She exhibits no tenderness.  Abdominal: Soft. Bowel sounds are normal. She exhibits no distension and no mass. There is no tenderness. There is no rebound and no guarding.  Musculoskeletal: Normal range of motion. She exhibits no edema, tenderness or deformity.  Lymphadenopathy:    She has no cervical adenopathy.  Neurological: She is alert and oriented to person, place, and time. She has normal reflexes. She displays normal reflexes. No cranial nerve deficit. She exhibits normal muscle tone. Coordination normal.  Skin: Skin is warm and dry. No rash noted. She is not diaphoretic.  Psychiatric: She has a normal mood and affect. Her behavior is normal. Judgment and  thought content normal.    Results for orders placed or performed in visit on 10/27/16 (from the past 72 hour(s))  POCT glycosylated hemoglobin (Hb A1C)     Status: None   Collection Time: 10/27/16 12:54 PM  Result Value Ref Range   Hemoglobin A1C 4.6   POCT urinalysis  dipstick     Status: Abnormal   Collection Time: 10/27/16  1:49 PM  Result Value Ref Range   Color, UA yellow yellow   Clarity, UA clear clear   Glucose, UA negative negative   Bilirubin, UA negative negative   Ketones, POC UA small (15) (A) negative   Spec Grav, UA 1.015 1.003, 1.005, 1.010, 1.015, 1.020, 1.025, 1.030, 1.035   Blood, UA negative negative   pH, UA 8.5 (A) 5.0, 5.5, 6.0, 6.5, 7.0, 7.5, 8.0   Protein Ur, POC =30 (A) negative   Urobilinogen, UA 1.0 0.2, 1.0, negative   Nitrite, UA Negative Negative   Leukocytes, UA Negative Negative  POCT CBC     Status: None   Collection Time: 10/27/16  1:51 PM  Result Value Ref Range   WBC 9.0 4.6 - 10.2 K/uL   Lymph, poc 1.7 0.6 - 3.4   POC LYMPH PERCENT 19.4 10 - 50 %L   MID (cbc) 0.5 0 - 0.9   POC MID % 6.0 0 - 12 %M   POC Granulocyte 6.7 2 - 6.9   Granulocyte percent 74.6 37 - 80 %G   RBC 4.32 4.04 - 5.48 M/uL   Hemoglobin 13.4 12.2 - 16.2 g/dL   HCT, POC 38.6 37.7 - 47.9 %   MCV 89.5 80 - 97 fL   MCH, POC 31.0 27 - 31.2 pg   MCHC 34.7 31.8 - 35.4 g/dL   RDW, POC 13.2 %   Platelet Count, POC 345 142 - 424 K/uL   MPV 7.3 0 - 99.8 fL     No results found.  ASSESSMENT AND PLAN:  Krystalle was seen today for a1c.  Diagnoses and all orders for this visit:  Screening for diabetes mellitus -     POCT glycosylated hemoglobin (Hb A1C)  Preoperative clearance: Cleared for surgery.  -     POCT CBC -     EKG 12-Lead -     CMP14+EGFR -     POCT urinalysis dipstick    The patient is advised to call or return to clinic if she does not see an improvement in symptoms, or to seek the care of the closest emergency department if she worsens with the above plan.   Philis Fendt, MHS, PA-C [facility] 10/27/2016 1:52 PM

## 2016-10-28 LAB — CMP14+EGFR
ALK PHOS: 107 IU/L (ref 39–117)
ALT: 15 IU/L (ref 0–32)
AST: 15 IU/L (ref 0–40)
Albumin/Globulin Ratio: 1.8 (ref 1.2–2.2)
Albumin: 4.6 g/dL (ref 3.5–5.5)
BILIRUBIN TOTAL: 0.5 mg/dL (ref 0.0–1.2)
BUN/Creatinine Ratio: 9 (ref 9–23)
BUN: 7 mg/dL (ref 6–20)
CHLORIDE: 101 mmol/L (ref 96–106)
CO2: 25 mmol/L (ref 18–29)
CREATININE: 0.8 mg/dL (ref 0.57–1.00)
Calcium: 9.7 mg/dL (ref 8.7–10.2)
GFR calc Af Amer: 110 mL/min/{1.73_m2} (ref >59)
GFR calc non Af Amer: 96 mL/min/{1.73_m2} (ref >59)
GLUCOSE: 81 mg/dL (ref 65–99)
Globulin, Total: 2.5 g/dL (ref 1.5–4.5)
Potassium: 4.8 mmol/L (ref 3.5–5.2)
Sodium: 142 mmol/L (ref 134–144)
TOTAL PROTEIN: 7.1 g/dL (ref 6.0–8.5)

## 2016-10-28 LAB — MAGNESIUM: MAGNESIUM: 2 mg/dL (ref 1.6–2.3)

## 2016-10-28 LAB — GAMMA GT: GGT: 31 IU/L (ref 0–60)

## 2016-10-30 ENCOUNTER — Emergency Department (HOSPITAL_COMMUNITY): Payer: PRIVATE HEALTH INSURANCE

## 2016-10-30 ENCOUNTER — Encounter (HOSPITAL_COMMUNITY): Payer: Self-pay | Admitting: Obstetrics and Gynecology

## 2016-10-30 ENCOUNTER — Emergency Department (HOSPITAL_COMMUNITY)
Admission: EM | Admit: 2016-10-30 | Discharge: 2016-10-30 | Disposition: A | Discharge status: Home / Self Care | Payer: PRIVATE HEALTH INSURANCE | Attending: Emergency Medicine | Admitting: Emergency Medicine

## 2016-10-30 DIAGNOSIS — Y990 Civilian activity done for income or pay: Secondary | ICD-10-CM | POA: Diagnosis not present

## 2016-10-30 DIAGNOSIS — Y929 Unspecified place or not applicable: Secondary | ICD-10-CM | POA: Diagnosis not present

## 2016-10-30 DIAGNOSIS — Z87891 Personal history of nicotine dependence: Secondary | ICD-10-CM | POA: Insufficient documentation

## 2016-10-30 DIAGNOSIS — Y939 Activity, unspecified: Secondary | ICD-10-CM | POA: Diagnosis not present

## 2016-10-30 DIAGNOSIS — X500XXA Overexertion from strenuous movement or load, initial encounter: Secondary | ICD-10-CM | POA: Diagnosis not present

## 2016-10-30 DIAGNOSIS — M5440 Lumbago with sciatica, unspecified side: Secondary | ICD-10-CM

## 2016-10-30 DIAGNOSIS — M5442 Lumbago with sciatica, left side: Secondary | ICD-10-CM | POA: Diagnosis not present

## 2016-10-30 DIAGNOSIS — M545 Low back pain: Secondary | ICD-10-CM | POA: Diagnosis present

## 2016-10-30 LAB — POC URINE PREG, ED: Preg Test, Ur: NEGATIVE

## 2016-10-30 MED ORDER — NAPROXEN 500 MG PO TABS: 500.0000 mg | ORAL_TABLET | Freq: Two times a day (BID) | ORAL | 0 refills | Status: DC | PRN

## 2016-10-30 MED ORDER — DIAZEPAM 5 MG PO TABS: 5.0000 mg | ORAL_TABLET | Freq: Four times a day (QID) | ORAL | 0 refills | Status: DC | PRN

## 2016-10-30 MED ORDER — HYDROCODONE-ACETAMINOPHEN 5-325 MG PO TABS
2.0000 | ORAL_TABLET | Freq: Once | ORAL | Status: AC
  Administered 2016-10-30: 2 via ORAL
  Filled 2016-10-30: qty 2

## 2016-10-30 MED ORDER — KETOROLAC TROMETHAMINE 60 MG/2ML IM SOLN
60.0000 mg | Freq: Once | INTRAMUSCULAR | Status: AC
  Administered 2016-10-30: 60 mg via INTRAMUSCULAR
  Filled 2016-10-30: qty 2

## 2016-10-30 MED ORDER — DIAZEPAM 5 MG PO TABS
10.0000 mg | ORAL_TABLET | Freq: Once | ORAL | Status: AC
  Administered 2016-10-30: 10 mg via ORAL
  Filled 2016-10-30: qty 2

## 2016-10-30 NOTE — ED Triage Notes (Signed)
Pt presents to the ED with c/o back pain. She states she was at work and a patient accident caused her pain. The incident occurred at approximately 1830 on 3/18. Pt denies any loss of Bowel/bladder.

## 2016-10-30 NOTE — ED Provider Notes (Signed)
[facility]-EMERGENCY DEPT Provider Note   CSN: [id number] Arrival date & time: 10/30/16  1954     History   Chief Complaint Chief Complaint  Patient presents with  . Back Pain    HPI Ana Pace is a 36 y.o. female.  Patient presents with left back pain and radiation down the right leg with tingling. This happened at work prior to arrival at the outside hospital. Patient had a bed tilt forward while she was holding it tightly with her left hand. The patient on the bed was very obese. Patient had said in onset left lower back pain. Worse with movement. Patient has surgery for this current herniation in February that went well. Patient denies any neurologic symptoms except for mild tingling in the right leg. No bowel or bladder changes. Pain worse with movement.      Past Medical History:  Diagnosis Date  . Abnormal Pap smear   . Anxiety   . Endometrial hyperplasia without atypia, simple   . Endometriosis   . GERD (gastroesophageal reflux disease)    tums prn  . Herniated nucleus pulposus of lumbosacral region   . Kidney stone   . Ovarian cyst     Patient Active Problem List   Diagnosis Date Noted  . HNP (herniated nucleus pulposus), lumbar 10/07/2016  . Herniated nucleus pulposus 10/07/2016    Past Surgical History:  Procedure Laterality Date  . CESAREAN SECTION     x3  . CESAREAN SECTION  04/02/2012   Procedure: CESAREAN SECTION;  Surgeon: Antionette Char, MD;  Location: [facility] ORS;  Service: Obstetrics;  Laterality: N/A;  Repeat cesarean section with delivery of baby girl at 45.  . CHOLECYSTECTOMY    . DIAGNOSTIC LAPAROSCOPY     endometriosis  . LITHOTRIPSY    . LUMBAR LAMINECTOMY/DECOMPRESSION MICRODISCECTOMY Right 10/07/2016   Procedure: Right Lumbar three-four Microdiscectomy;  Surgeon: Loura Halt Ditty, MD;  Location: [facility] OR;  Service: Neurosurgery;  Laterality: Right;    OB History    Gravida Para Term Preterm AB Living   3 3 1     3    SAB TAB  Ectopic Multiple Live Births           1       Home Medications    Prior to Admission medications   Medication Sig Start Date End Date Taking? Authorizing Provider  gabapentin (NEURONTIN) 300 MG capsule Take 1 capsule (300 mg total) by mouth 3 (three) times daily. 10/08/16  Yes Vincent J Costella, PA-C  methocarbamol (ROBAXIN) 500 MG tablet Take 2 tablets (1,000 mg total) by mouth 4 (four) times daily as needed for muscle spasms (muscle spasm/pain). Patient taking differently: Take 750 mg by mouth 4 (four) times daily as needed for muscle spasms (muscle spasm/pain).  10/02/16  Yes Samuel Jester, DO  oxyCODONE-acetaminophen (PERCOCET/ROXICET) 5-325 MG tablet Take 1-2 tablets by mouth every 4 (four) hours as needed. 10/08/16  Yes Vincent J Costella, PA-C  diazepam (VALIUM) 5 MG tablet Take 1 tablet (5 mg total) by mouth every 6 (six) hours as needed for anxiety (spasms). 10/30/16   Blane Ohara, MD  naproxen (NAPROSYN) 500 MG tablet Take 1 tablet (500 mg total) by mouth 2 (two) times daily as needed. 10/30/16   Blane Ohara, MD    Family History Family History  Problem Relation Age of Onset  . Hypertension Mother     Social History Social History  Substance Use Topics  . Smoking status: Former Smoker    Years:  3.00    Types: Cigarettes    Quit date: 09/05/2007  . Smokeless tobacco: Never Used  . Alcohol use Yes     Comment: occasional     Allergies   Morphine and related and Codeine   Review of Systems Review of Systems  Constitutional: Negative for fever.  Respiratory: Negative for shortness of breath.   Gastrointestinal: Negative for abdominal pain and vomiting.  Genitourinary: Negative for difficulty urinating and dysuria.  Musculoskeletal: Positive for back pain. Negative for arthralgias.  Skin: Negative for rash.  Neurological: Negative for weakness and numbness.  All other systems reviewed and are negative.    Physical Exam Updated Vital Signs BP (!) 123/96    Pulse 93   Temp 98.3 F (36.8 C) (Oral)   Resp 16   Ht 5\' 8"  (1.727 m)   Wt 230 lb (104.3 kg)   LMP 10/16/2016 (Approximate)   SpO2 99%   BMI 34.97 kg/m   Physical Exam  Constitutional: She is oriented to person, place, and time. She appears well-developed and well-nourished.  HENT:  Head: Normocephalic and atraumatic.  Eyes: Conjunctivae are normal. Right eye exhibits no discharge. Left eye exhibits no discharge.  Neck: Normal range of motion. Neck supple. No tracheal deviation present.  Cardiovascular: Normal rate and regular rhythm.   Pulmonary/Chest: Effort normal and breath sounds normal.  Abdominal: Soft. She exhibits no distension. There is no tenderness. There is no guarding.  Musculoskeletal: She exhibits tenderness. She exhibits no edema.  Patient has moderate tenderness left paraspinal lumbar sacral region. Minimal midline tenderness. Patient has pain with flexion of the right hip and mild radiation down the right thigh.  Neurological: She is alert and oriented to person, place, and time.  Patient has 5+ strength with flexion extension of hips knees and great toes bilateral lower extremity's. Sensation intact to major nerves in lower extremities. Equal 2+ reflexes bilateral.  Skin: Skin is warm. No rash noted.  Psychiatric: She has a normal mood and affect.  Nursing note and vitals reviewed.    ED Treatments / Results  Labs (all labs ordered are listed, but only abnormal results are displayed) Labs Reviewed  POC URINE PREG, ED    EKG  EKG Interpretation None       Radiology Dg Lumbar Spine Complete  Result Date: 10/30/2016 CLINICAL DATA:  36 year old female with fall and back pain. EXAM: LUMBAR SPINE - COMPLETE 4+ VIEW COMPARISON:  Lumbar spine radiograph dated 10/07/2016 FINDINGS: There is no acute fracture or subluxation of the lumbar spine. The vertebral body heights and disc spaces are maintained. There is mild levoscoliosis. The visualized transverse  and spinous processes appear intact. The soft tissues appear unremarkable. Punctate densities over the left renal silhouette may represent renal calculi versus stool within the colon. IMPRESSION: No acute/ traumatic lumbar spine pathology. Electronically Signed   By: Elgie CollardArash  Radparvar M.D.   On: 10/30/2016 23:19    Procedures Procedures (including critical care time)  Medications Ordered in ED Medications  ketorolac (TORADOL) injection 60 mg (60 mg Intramuscular Given 10/30/16 2205)  diazepam (VALIUM) tablet 10 mg (10 mg Oral Given 10/30/16 2205)  HYDROcodone-acetaminophen (NORCO/VICODIN) 5-325 MG per tablet 2 tablet (2 tablets Oral Given 10/30/16 2221)     Initial Impression / Assessment and Plan / ED Course  I have reviewed the triage vital signs and the nursing notes.  Pertinent labs & imaging results that were available during my care of the patient were reviewed by me and considered in  my medical decision making (see chart for details).    Patient presents with musculoskeletal injury since work incident. Patient has normal neurologic exam. No indication for MRI. Discussed supportive care, screening x-rays and medicines in the ER. Patient has outpatient follow-up with her surgeon from her lumbar surgery last month.  Results and differential diagnosis were discussed with the patient/parent/guardian. Xrays were independently reviewed by myself.  Close follow up outpatient was discussed, comfortable with the plan.   Medications  ketorolac (TORADOL) injection 60 mg (60 mg Intramuscular Given 10/30/16 2205)  diazepam (VALIUM) tablet 10 mg (10 mg Oral Given 10/30/16 2205)  HYDROcodone-acetaminophen (NORCO/VICODIN) 5-325 MG per tablet 2 tablet (2 tablets Oral Given 10/30/16 2221)    Vitals:   10/30/16 2030 10/30/16 2100 10/30/16 2115 10/30/16 2145  BP: 138/83  111/73 (!) 123/96  Pulse: 86  92 93  Resp: 18 18 16 16   Temp:      TempSrc:      SpO2: 100%  95% 99%  Weight:      Height:          Final diagnoses:  Acute left-sided low back pain with sciatica, sciatica laterality unspecified     Final Clinical Impressions(s) / ED Diagnoses   Final diagnoses:  Acute left-sided low back pain with sciatica, sciatica laterality unspecified    New Prescriptions New Prescriptions   DIAZEPAM (VALIUM) 5 MG TABLET    Take 1 tablet (5 mg total) by mouth every 6 (six) hours as needed for anxiety (spasms).   NAPROXEN (NAPROSYN) 500 MG TABLET    Take 1 tablet (500 mg total) by mouth 2 (two) times daily as needed.     Blane Ohara, MD 10/30/16 [phone]

## 2016-10-30 NOTE — Discharge Instructions (Signed)
Follow-up with your primary doctor and/or back surgeon as needed. Return to the ER if he develop weakness, bowel or bladder changes or new concerns  If you were given medicines take as directed.  If you are on coumadin or contraceptives realize their levels and effectiveness is altered by many different medicines.  If you have any reaction (rash, tongues swelling, other) to the medicines stop taking and see a physician.    If your blood pressure was elevated in the ER make sure you follow up for management with a primary doctor or return for chest pain, shortness of breath or stroke symptoms.  Please follow up as directed and return to the ER or see a physician for new or worsening symptoms.  Thank you. Vitals:   10/30/16 2009  BP: (!) 120/96  Pulse: 86  Resp: 18  Temp: 98.3 F (36.8 C)  TempSrc: Oral  SpO2: 100%  Weight: 230 lb (104.3 kg)  Height: 5\' 8"  (1.727 m)

## 2016-10-31 ENCOUNTER — Telehealth: Payer: Self-pay | Admitting: Physician Assistant

## 2016-10-31 NOTE — Telephone Encounter (Signed)
Patient would like someone to call her to see if a certain type of test was run on her blood work. Her call back number is [phone]. States she doesn't see it on her MyChart results so she doesn't think it was done and needs to know if she needs to come back in.

## 2016-11-01 NOTE — Telephone Encounter (Addendum)
PATIENT CALLED BACK TODAY TO SAY THAT WHEN SHE WAS IN ON Thursday (10/27/16) AND SHE SAW MICHAEL CLARK HE WAS GOING TO FAX HER MEDICAL CLEARANCE FORM BACK TO [facility] IN [place]. SHE CALLED THEM TODAY AND THEY STILL HAVE NOT RECEIVED IT. SHE WOULD LIKE TO KNOW THE STATUS? BEST PHONE [phone] (CELL)  [facility] FAX IS: [phone] (ATTN) ALEJANDRA TZITZIOS. MBC

## 2016-11-02 ENCOUNTER — Other Ambulatory Visit: Payer: Self-pay | Admitting: Physician Assistant

## 2016-11-02 ENCOUNTER — Ambulatory Visit (INDEPENDENT_AMBULATORY_CARE_PROVIDER_SITE_OTHER): Payer: 59 | Admitting: Physician Assistant

## 2016-11-02 ENCOUNTER — Encounter: Payer: Self-pay | Admitting: Physician Assistant

## 2016-11-02 DIAGNOSIS — Z01818 Encounter for other preprocedural examination: Secondary | ICD-10-CM | POA: Diagnosis not present

## 2016-11-02 NOTE — Telephone Encounter (Signed)
Have you seen this ?

## 2016-11-02 NOTE — Progress Notes (Addendum)
Patient here for more labs for preoperative clearance.  See my previous note for more details.  She has already been cleared for surgery. Ordering labs. She has no complaints today. Will fax results to [phone] (ATTN) ALEJANDRA TZITZIOS. MBC   Diagnoses and all orders for this visit:  Preoperative clearance -     PT AND PTT -     hCG, serum, qualitative

## 2016-11-03 LAB — PT AND PTT
APTT: 27 s (ref 24–33)
INR: 1 (ref 0.8–1.2)
PROTHROMBIN TIME: 11 s (ref 9.1–12.0)

## 2016-11-03 LAB — HCG, SERUM, QUALITATIVE: HCG, BETA SUBUNIT, QUAL, SERUM: NEGATIVE m[IU]/mL (ref <6)

## 2016-11-03 NOTE — Progress Notes (Signed)
Please fax to [phone] with attention to Alejandra Tzitzios.  Deliah BostonMichael Clark, MS, PA-C 8:22 AM, 11/03/2016

## 2016-11-18 HISTORY — PX: OTHER SURGICAL HISTORY: SHX169

## 2016-11-24 ENCOUNTER — Encounter (HOSPITAL_COMMUNITY): Payer: Self-pay | Admitting: Emergency Medicine

## 2016-11-24 ENCOUNTER — Emergency Department (HOSPITAL_COMMUNITY)
Admission: EM | Admit: 2016-11-24 | Discharge: 2016-11-25 | Disposition: A | Discharge status: Home / Self Care | Payer: 59 | Attending: Emergency Medicine | Admitting: Emergency Medicine

## 2016-11-24 DIAGNOSIS — M79661 Pain in right lower leg: Secondary | ICD-10-CM | POA: Diagnosis not present

## 2016-11-24 DIAGNOSIS — Z87891 Personal history of nicotine dependence: Secondary | ICD-10-CM | POA: Insufficient documentation

## 2016-11-24 DIAGNOSIS — Z79899 Other long term (current) drug therapy: Secondary | ICD-10-CM | POA: Insufficient documentation

## 2016-11-24 LAB — CBC WITH DIFFERENTIAL/PLATELET
BASOS ABS: 0 10*3/uL (ref 0.0–0.1)
Basophils Relative: 0 %
EOS ABS: 0.3 10*3/uL (ref 0.0–0.7)
EOS PCT: 4 %
HCT: 31.3 % — ABNORMAL LOW (ref 36.0–46.0)
HEMOGLOBIN: 10.2 g/dL — AB (ref 12.0–15.0)
LYMPHS ABS: 1.5 10*3/uL (ref 0.7–4.0)
Lymphocytes Relative: 19 %
MCH: 29.3 pg (ref 26.0–34.0)
MCHC: 32.6 g/dL (ref 30.0–36.0)
MCV: 89.9 fL (ref 78.0–100.0)
Monocytes Absolute: 0.5 10*3/uL (ref 0.1–1.0)
Monocytes Relative: 6 %
NEUTROS PCT: 71 %
Neutro Abs: 5.6 10*3/uL (ref 1.7–7.7)
PLATELETS: 279 10*3/uL (ref 150–400)
RBC: 3.48 MIL/uL — ABNORMAL LOW (ref 3.87–5.11)
RDW: 12.6 % (ref 11.5–15.5)
WBC: 7.8 10*3/uL (ref 4.0–10.5)

## 2016-11-24 LAB — COMPREHENSIVE METABOLIC PANEL
ALBUMIN: 3 g/dL — AB (ref 3.5–5.0)
ALK PHOS: 89 U/L (ref 38–126)
ALT: 59 U/L — AB (ref 14–54)
AST: 59 U/L — AB (ref 15–41)
Anion gap: 6 (ref 5–15)
BUN: 6 mg/dL (ref 6–20)
CALCIUM: 8.3 mg/dL — AB (ref 8.9–10.3)
CHLORIDE: 106 mmol/L (ref 101–111)
CO2: 28 mmol/L (ref 22–32)
CREATININE: 0.76 mg/dL (ref 0.44–1.00)
GFR calc non Af Amer: >60 mL/min (ref >60)
GLUCOSE: 105 mg/dL — AB (ref 65–99)
Potassium: 3.3 mmol/L — ABNORMAL LOW (ref 3.5–5.1)
SODIUM: 140 mmol/L (ref 135–145)
Total Bilirubin: 0.3 mg/dL (ref 0.3–1.2)
Total Protein: 5.9 g/dL — ABNORMAL LOW (ref 6.5–8.1)

## 2016-11-24 LAB — D-DIMER, QUANTITATIVE: D-Dimer, Quant: 0.3 ug/mL-FEU (ref 0.00–0.50)

## 2016-11-24 NOTE — ED Triage Notes (Signed)
Patient had surgery on 11/18/2016 and flew home today. Now patient is having pain in right calf that started 6pm tonight.

## 2016-11-24 NOTE — ED Provider Notes (Signed)
[facility]-EMERGENCY DEPT Provider Note   CSN: [id number] Arrival date & time: 11/24/16  2147  By signing my name below, I, Linna Darner, attest that this documentation has been prepared under the direction and in the presence of [employer], PA-C. Electronically Signed: Linna Darner, Scribe. 11/25/2016. 12:16 AM.  History   Chief Complaint Chief Complaint  Patient presents with  . Leg Pain  . Possible dvt in right lower leg    The history is provided by the patient. No language interpreter was used.     HPI Comments: Ana Pace is a 36 y.o. female who presents to the Emergency Department complaining of constant, non-radiating pain in her right calf beginning on the afternoon of 4/12. She had a breast augmentation and abdominoplasty performed on 4/6 in [place] and was placed on Lovenox for 5 days; she flew back to [place] on 4/12 and the flight lasted for an hour and 40 minutes. Patient states she took a nap upon arriving home and awoke with a fever (tmax 101) and pain in her right calf. She took Tylenol PTA with improvement of her fever but without improvement of her calf pain. She states her calf pain is worse with applied pressure to the area. Patient reports she has been able to move and ambulate normally. No h/o PE/DVT. No hormone therapy. She is a non-smoker. Pt denies dysuria, back pain, rashes, chest pain, cough, SOB/DOE, lightheadedness, chills, or any other associated symptoms.  Past Medical History:  Diagnosis Date  . Abnormal Pap smear   . Anxiety   . Endometrial hyperplasia without atypia, simple   . Endometriosis   . GERD (gastroesophageal reflux disease)    tums prn  . Herniated nucleus pulposus of lumbosacral region   . Kidney stone   . Ovarian cyst     Patient Active Problem List   Diagnosis Date Noted  . HNP (herniated nucleus pulposus), lumbar 10/07/2016  . Herniated nucleus pulposus 10/07/2016    Past Surgical History:  Procedure Laterality Date  . CESAREAN  SECTION     x3  . CESAREAN SECTION  04/02/2012   Procedure: CESAREAN SECTION;  Surgeon: Antionette Char, MD;  Location: [facility];  Service: Obstetrics;  Laterality: N/A;  Repeat cesarean section with delivery of baby girl at 50.  . CHOLECYSTECTOMY    . DIAGNOSTIC LAPAROSCOPY     endometriosis  . LITHOTRIPSY    . LUMBAR LAMINECTOMY/DECOMPRESSION MICRODISCECTOMY Right 10/07/2016   Procedure: Right Lumbar three-four Microdiscectomy;  Surgeon: Loura Halt Ditty, MD;  Location: [facility] OR;  Service: Neurosurgery;  Laterality: Right;    OB History    Gravida Para Term Preterm AB Living   SAB TAB Ectopic Multiple Live Births           1       Home Medications    Prior to Admission medications   Medication Sig Start Date End Date Taking? Authorizing Provider  cephALEXin (KEFLEX) 500 MG capsule Take 500 mg by mouth every 8 (eight) hours. 11/18/16  Yes Historical Provider, MD  cyclobenzaprine (FLEXERIL) 10 MG tablet Take 10 mg by mouth every 8 (eight) hours as needed for muscle spasms.  11/18/16  Yes Historical Provider, MD  ferrous sulfate 325 (65 FE) MG EC tablet Take 1 tablet by mouth daily with breakfast.   Yes Historical Provider, MD  Homeopathic Products (ARNICA-HEEL PO) Take 1 tablet by mouth daily.   Yes Historical Provider, MD  Multiple  Vitamins-Minerals (ZINC PO) Take 1 tablet by mouth daily.   Yes Historical Provider, MD  ondansetron (ZOFRAN) 4 MG tablet Take 4 mg by mouth every 6 (six) hours as needed for nausea or vomiting.  11/18/16  Yes Historical Provider, MD  oxyCODONE-acetaminophen (PERCOCET/ROXICET) 5-325 MG tablet Take 1-2 tablets by mouth every 4 (four) hours as needed. Patient taking differently: Take 1-2 tablets by mouth every 4 (four) hours as needed for moderate pain or severe pain.  10/08/16  Yes Vincent J Costella, PA-C  diazepam (VALIUM) 5 MG tablet Take 1 tablet (5 mg total) by mouth every 6 (six) hours as needed for anxiety (spasms). Patient not taking:  Reported on 11/24/2016 10/30/16   Blane Ohara, MD  gabapentin (NEURONTIN) 300 MG capsule Take 1 capsule (300 mg total) by mouth 3 (three) times daily. Patient not taking: Reported on 11/24/2016 10/08/16   Darci Current Costella, PA-C  methocarbamol (ROBAXIN) 500 MG tablet Take 2 tablets (1,000 mg total) by mouth 4 (four) times daily as needed for muscle spasms (muscle spasm/pain). Patient not taking: Reported on 11/24/2016 10/02/16   Samuel Jester, DO  naproxen (NAPROSYN) 500 MG tablet Take 1 tablet (500 mg total) by mouth 2 (two) times daily as needed. Patient not taking: Reported on 11/24/2016 10/30/16   Blane Ohara, MD    Family History Family History  Problem Relation Age of Onset  . Hypertension Mother     Social History Social History  Substance Use Topics  . Smoking status: Former Smoker    Years: 3.00    Types: Cigarettes    Quit date: 09/05/2007  . Smokeless tobacco: Never Used  . Alcohol use Yes     Comment: occasional     Allergies   Morphine and related and Codeine   Review of Systems Review of Systems  Constitutional: Positive for fever. Negative for chills.  HENT: Negative for rhinorrhea and sore throat.   Eyes: Negative for redness.  Respiratory: Negative for cough and shortness of breath.   Cardiovascular: Negative for chest pain.  Gastrointestinal: Negative for abdominal pain, diarrhea, nausea and vomiting.  Genitourinary: Negative for dysuria.  Musculoskeletal: Positive for myalgias. Negative for back pain and gait problem.  Skin: Negative for rash.  Neurological: Negative for light-headedness and headaches.   Physical Exam Updated Vital Signs BP 114/80 (BP Location: Left Arm)   Pulse (!) 125   Temp 98.8 F (37.1 C) (Oral)   Resp 16   Ht  (1.727 m)   Wt 220 lb (99.8 kg)   LMP 11/14/2016 (Exact Date)   SpO2 98%   BMI 33.45 kg/m   Physical Exam  Constitutional: She appears well-developed and well-nourished. No distress.  HENT:  Head:  Normocephalic and atraumatic.  Eyes: Conjunctivae and EOM are normal. Right eye exhibits no discharge. Left eye exhibits no discharge.  Neck: Normal range of motion. Neck supple. No tracheal deviation present.  Cardiovascular: Normal rate, regular rhythm and normal heart sounds.   Pulses:      Dorsalis pedis pulses are 2+ on the right side, and 2+ on the left side.  HR 100  Pulmonary/Chest: Effort normal and breath sounds normal. No respiratory distress.  Abdominal: Soft. She exhibits no mass. There is tenderness. There is no guarding.  Healing transverse lower abdominal wound with drains in place. Drainage is not purulent. No significant tenderness. Does not appear actively infected.  Musculoskeletal: Normal range of motion. She exhibits tenderness.       Right hip: Normal.  Right knee: Normal.       Right ankle: Normal.       Right upper leg: She exhibits no tenderness.       Right lower leg: She exhibits tenderness. She exhibits no swelling and no edema.       Legs: Neurological: She is alert.  Skin: Skin is warm and dry.  Psychiatric: She has a normal mood and affect. Her behavior is normal.  Nursing note and vitals reviewed.  ED Treatments / Results  Labs (all labs ordered are listed, but only abnormal results are displayed) Labs Reviewed  CBC WITH DIFFERENTIAL/PLATELET - Abnormal; Notable for the following:       Result Value   RBC 3.48 (*)    Hemoglobin 10.2 (*)    HCT 31.3 (*)    All other components within normal limits  COMPREHENSIVE METABOLIC PANEL - Abnormal; Notable for the following:    Potassium 3.3 (*)    Glucose, Bld 105 (*)    Calcium 8.3 (*)    Total Protein 5.9 (*)    Albumin 3.0 (*)    AST 59 (*)    ALT 59 (*)    All other components within normal limits  D-DIMER, QUANTITATIVE (NOT AT [facility])    Procedures Procedures (including critical care time)  DIAGNOSTIC STUDIES: Oxygen Saturation is 98% on RA, normal by my interpretation.    COORDINATION  OF CARE: 12:25 AM Discussed treatment plan with pt at bedside and pt agreed to plan.  Medications Ordered in ED Medications  enoxaparin (LOVENOX) injection 100 mg (100 mg Subcutaneous Given 11/25/16 0203)  potassium chloride SA (K-DUR,KLOR-CON) CR tablet 40 mEq (40 mEq Oral Given 11/25/16 0209)     Initial Impression / Assessment and Plan / ED Course  I have reviewed the triage vital signs and the nursing notes.  Pertinent labs & imaging results that were available during my care of the patient were reviewed by me and considered in my medical decision making (see chart for details).     Patient seen and examined. She does not have clinical PE. D-dimer is negative however patient is not technically low risk per Wells criteria. Tachycardia much improved now that she is back in the room.  Vital signs reviewed and are as follows: BP 107/73 (BP Location: Left Arm)   Pulse 98   Temp 98.6 F (37 C) (Oral)   Resp 17   Ht  (1.727 m)   Wt 99.8 kg   LMP 11/14/2016 (Exact Date)   SpO2 100%   BMI 33.45 kg/m   Discussed with Dr. Criss Alvine who has seen patient. Degrees low suspicion for PE. Patient will be given a dose of Lovenox. Order placed for outpatient ultrasound area she is instructed to return in the morning. Patient given potassium in ED. We discussed lab workup.  Encouraged to return immediately with shortness of breath, chest pain, syncopal, new symptoms or other concerns.  Final Clinical Impressions(s) / ED Diagnoses   Final diagnoses:  Right calf pain   Patient with acute onset of right calf tenderness after recent surgery and short duration airplane flight. D-dimer is negative. No clinical signs of PE. Patient was tachycardic on arrival. This is improved. She has no chest pain or shortness of breath. Patient was covered with Lovenox in emergency department. She will return tomorrow for definitive ultrasound of her lower extremity. From a surgery standpoint, she appears to be  well-healing. No unexpected tenderness or purulent drainage from her wounds. She  reports having a fever earlier tonight but has been afebrile while in emergency department.  New Prescriptions New Prescriptions   No medications on file   I personally performed the services described in this documentation, which was scribed in my presence. The recorded information has been reviewed and is accurate.    Renne Crigler, PA-C 11/25/16 [id number]    Pricilla Loveless, MD 11/25/16 [phone]

## 2016-11-25 ENCOUNTER — Ambulatory Visit (HOSPITAL_BASED_OUTPATIENT_CLINIC_OR_DEPARTMENT_OTHER)
Admission: RE | Admit: 2016-11-25 | Discharge: 2016-11-25 | Disposition: A | Discharge status: Home / Self Care | Payer: 59 | Source: Ambulatory Visit | Attending: Emergency Medicine | Admitting: Emergency Medicine

## 2016-11-25 DIAGNOSIS — M79609 Pain in unspecified limb: Secondary | ICD-10-CM | POA: Diagnosis not present

## 2016-11-25 DIAGNOSIS — M79661 Pain in right lower leg: Secondary | ICD-10-CM | POA: Insufficient documentation

## 2016-11-25 DIAGNOSIS — Z87891 Personal history of nicotine dependence: Secondary | ICD-10-CM | POA: Diagnosis not present

## 2016-11-25 DIAGNOSIS — Z79899 Other long term (current) drug therapy: Secondary | ICD-10-CM | POA: Diagnosis not present

## 2016-11-25 MED ORDER — POTASSIUM CHLORIDE CRYS ER 20 MEQ PO TBCR
40.0000 meq | EXTENDED_RELEASE_TABLET | Freq: Once | ORAL | Status: AC
  Administered 2016-11-25: 40 meq via ORAL
  Filled 2016-11-25: qty 2

## 2016-11-25 MED ORDER — ENOXAPARIN SODIUM 100 MG/ML ~~LOC~~ SOLN
100.0000 mg | Freq: Once | SUBCUTANEOUS | Status: AC
  Administered 2016-11-25: 100 mg via SUBCUTANEOUS
  Filled 2016-11-25: qty 1

## 2016-11-25 NOTE — Discharge Instructions (Signed)
Please read and follow all provided instructions.  Your diagnoses today include:  1. Right calf pain     Tests performed today include:  Blood counts and electrolytes  D-dimer  Vital signs. See below for your results today.   Medications prescribed:   None  Take any prescribed medications only as directed.  Additional Information:  Follow any educational materials contained in this packet.  Although you appear stable for discharge, you may still have a blood clot in your leg(s) called a DVT (deep venous thrombosis). You may have received initial treatment with an injection of a blood thinner to treat DVTs, but low risk patients do not always get treated before an ultrasound is performed to diagnose or rule out a DVT, due to the risks of bleeding from the medication used. It is important you follow up for an ultrasound within one day as directed.  Follow-up instructions: Please return to the hospital tomorrow morning for your ultrasound as directed.  Please follow-up with your primary care provider in the next 1-2 days for further evaluation of your symptoms.   Return instructions:   Please return to the Emergency Department if you experience worsening symptoms.  Return immediately if you develop chest pain, shortness of breath, dizziness or fainting.  Return with new color change or weakness/numbness to your affected leg(s).  Return with bleeding, severe headache, confusion or altered mental status.  Please return if you have any other emergent concerns.  Additional Information:  Your vital signs today were: BP 107/73 (BP Location: Left Arm)    Pulse 98    Temp 98.6 F (37 C) (Oral)    Resp 17    Ht  (1.727 m)    Wt 99.8 kg    LMP 11/14/2016 (Exact Date)    SpO2 100%    BMI 33.45 kg/m  If your blood pressure (BP) was elevated above 135/85 this visit, please have this repeated by your doctor within one month. --------------

## 2016-11-25 NOTE — Progress Notes (Signed)
*  PRELIMINARY RESULTS* Vascular Ultrasound Right lower extremity venous duplexd has been completed.  Preliminary findings: No evidence of DVT or baker's cyst.    Farrel Demark, RDMS, RVT  11/25/2016, 8:57 AM

## 2016-12-09 ENCOUNTER — Emergency Department (HOSPITAL_COMMUNITY)
Admission: EM | Admit: 2016-12-09 | Discharge: 2016-12-09 | Disposition: A | Discharge status: Home / Self Care | Payer: 59 | Attending: Emergency Medicine | Admitting: Emergency Medicine

## 2016-12-09 ENCOUNTER — Encounter (HOSPITAL_COMMUNITY): Payer: Self-pay | Admitting: Emergency Medicine

## 2016-12-09 DIAGNOSIS — Z79899 Other long term (current) drug therapy: Secondary | ICD-10-CM | POA: Insufficient documentation

## 2016-12-09 DIAGNOSIS — Z87891 Personal history of nicotine dependence: Secondary | ICD-10-CM | POA: Insufficient documentation

## 2016-12-09 DIAGNOSIS — R51 Headache: Secondary | ICD-10-CM | POA: Diagnosis present

## 2016-12-09 DIAGNOSIS — G43809 Other migraine, not intractable, without status migrainosus: Secondary | ICD-10-CM | POA: Diagnosis not present

## 2016-12-09 HISTORY — DX: Dorsalgia, unspecified: M54.9

## 2016-12-09 HISTORY — DX: Other chronic pain: G89.29

## 2016-12-09 LAB — I-STAT CHEM 8, ED
BUN: 4 mg/dL — AB (ref 6–20)
CALCIUM ION: 1.18 mmol/L (ref 1.15–1.40)
CREATININE: 0.8 mg/dL (ref 0.44–1.00)
Chloride: 102 mmol/L (ref 101–111)
Glucose, Bld: 95 mg/dL (ref 65–99)
HCT: 35 % — ABNORMAL LOW (ref 36.0–46.0)
HEMOGLOBIN: 11.9 g/dL — AB (ref 12.0–15.0)
Potassium: 4.1 mmol/L (ref 3.5–5.1)
Sodium: 137 mmol/L (ref 135–145)
TCO2: 25 mmol/L (ref 0–100)

## 2016-12-09 MED ORDER — METOCLOPRAMIDE HCL 5 MG/ML IJ SOLN
10.0000 mg | Freq: Once | INTRAMUSCULAR | Status: AC
  Administered 2016-12-09: 10 mg via INTRAMUSCULAR
  Filled 2016-12-09: qty 2

## 2016-12-09 MED ORDER — KETOROLAC TROMETHAMINE 60 MG/2ML IM SOLN
60.0000 mg | Freq: Once | INTRAMUSCULAR | Status: AC
  Administered 2016-12-09: 60 mg via INTRAMUSCULAR
  Filled 2016-12-09: qty 2

## 2016-12-09 MED ORDER — DIPHENHYDRAMINE HCL 50 MG/ML IJ SOLN
50.0000 mg | Freq: Once | INTRAMUSCULAR | Status: DC
  Filled 2016-12-09: qty 1

## 2016-12-09 MED ORDER — DIPHENHYDRAMINE HCL 25 MG PO CAPS
50.0000 mg | ORAL_CAPSULE | Freq: Once | ORAL | Status: AC
  Administered 2016-12-09: 50 mg via ORAL
  Filled 2016-12-09: qty 2

## 2016-12-09 MED ORDER — SODIUM CHLORIDE 0.9 % IV BOLUS (SEPSIS)
1000.0000 mL | Freq: Once | INTRAVENOUS | Status: AC
  Administered 2016-12-09: 1000 mL via INTRAVENOUS

## 2016-12-09 MED ORDER — METOCLOPRAMIDE HCL 10 MG PO TABS: 10.0000 mg | ORAL_TABLET | Freq: Four times a day (QID) | ORAL | 0 refills | Status: AC | PRN

## 2016-12-09 MED ORDER — DEXAMETHASONE 4 MG PO TABS
8.0000 mg | ORAL_TABLET | Freq: Once | ORAL | Status: AC
  Administered 2016-12-09: 8 mg via ORAL
  Filled 2016-12-09: qty 2

## 2016-12-09 NOTE — ED Notes (Signed)
Pt sleeping upon rounding. Patients daughter made aware if they need anything to let this nurse know.

## 2016-12-09 NOTE — ED Provider Notes (Signed)
[facility]-EMERGENCY DEPT Provider Note   CSN: [id number] Arrival date & time: 12/09/16  0716     History   Chief Complaint Chief Complaint  Patient presents with  . Migraine    HPI Ana Pace is a 36 y.o. female.  HPI  Pt was seen at 0735. Per pt, c/o gradual onset and persistence of constant acute flair of her chronic migraine headache for the past 2 days.  Has been associated with nausea. Describes the headache as located in the left side of her head, and per her usual chronic migraine headache pain pattern many years. Pt took tylenol, motrin and vicodin without improvement.  Denies headache was sudden or maximal in onset or at any time.  Denies visual changes, no focal motor weakness, no tingling/numbness in extremities, no fevers, no neck pain, no rash, no vomiting, no abd pain, no CP/SOB.      Past Medical History:  Diagnosis Date  . Abnormal Pap smear   . Anxiety   . Chronic back pain   . Endometrial hyperplasia without atypia, simple   . Endometriosis   . GERD (gastroesophageal reflux disease)    tums prn  . Herniated nucleus pulposus of lumbosacral region   . Kidney stone   . Ovarian cyst     Patient Active Problem List   Diagnosis Date Noted  . HNP (herniated nucleus pulposus), lumbar 10/07/2016  . Herniated nucleus pulposus 10/07/2016    Past Surgical History:  Procedure Laterality Date  . abdominalplasty  11/18/2016  . BREAST ENHANCEMENT SURGERY    . CESAREAN SECTION     x3  . CESAREAN SECTION  04/02/2012   Procedure: CESAREAN SECTION;  Surgeon: Antionette Char, MD;  Location: [facility] ORS;  Service: Obstetrics;  Laterality: N/A;  Repeat cesarean section with delivery of baby girl at 33.  . CHOLECYSTECTOMY    . DIAGNOSTIC LAPAROSCOPY     endometriosis  . LITHOTRIPSY    . LUMBAR LAMINECTOMY/DECOMPRESSION MICRODISCECTOMY Right 10/07/2016   Procedure: Right Lumbar three-four Microdiscectomy;  Surgeon: Loura Halt Ditty, MD;  Location: [facility] OR;  Service:  Neurosurgery;  Laterality: Right;    OB History    Gravida Para Term Preterm AB Living   SAB TAB Ectopic Multiple Live Births           1       Home Medications    Prior to Admission medications   Medication Sig Start Date End Date Taking? Authorizing Provider  cephALEXin (KEFLEX) 500 MG capsule Take 500 mg by mouth every 8 (eight) hours. 11/18/16   Historical Provider, MD  cyclobenzaprine (FLEXERIL) 10 MG tablet Take 10 mg by mouth every 8 (eight) hours as needed for muscle spasms.  11/18/16   Historical Provider, MD  ferrous sulfate 325 (65 FE) MG EC tablet Take 1 tablet by mouth daily with breakfast.    Historical Provider, MD  Homeopathic Products (ARNICA-HEEL PO) Take 1 tablet by mouth daily.    Historical Provider, MD  Multiple Vitamins-Minerals (ZINC PO) Take 1 tablet by mouth daily.    Historical Provider, MD  ondansetron (ZOFRAN) 4 MG tablet Take 4 mg by mouth every 6 (six) hours as needed for nausea or vomiting.  11/18/16   Historical Provider, MD  oxyCODONE-acetaminophen (PERCOCET/ROXICET) 5-325 MG tablet Take 1-2 tablets by mouth every 4 (four) hours as needed. Patient taking differently: Take 1-2 tablets by mouth every 4 (four) hours as needed for moderate pain or  severe pain.  10/08/16   Alyson Ingles, PA-C    Family History Family History  Problem Relation Age of Onset  . Hypertension Mother     Social History Social History  Substance Use Topics  . Smoking status: Former Smoker    Years: 3.00    Types: Cigarettes    Quit date: 09/05/2007  . Smokeless tobacco: Never Used  . Alcohol use Yes     Comment: occasional     Allergies   Morphine and related and Codeine   Review of Systems Review of Systems ROS: Statement: All systems negative except as marked or noted in the HPI; Constitutional: Negative for fever and chills. ; ; Eyes: Negative for eye pain, redness and discharge. ; ; ENMT: Negative for ear pain, hoarseness, nasal congestion, sinus  pressure and sore throat. ; ; Cardiovascular: Negative for chest pain, palpitations, diaphoresis, dyspnea and peripheral edema. ; ; Respiratory: Negative for cough, wheezing and stridor. ; ; Gastrointestinal: +nausea. Negative for vomiting, diarrhea, abdominal pain, blood in stool, hematemesis, jaundice and rectal bleeding. . ; ; Genitourinary: Negative for dysuria, flank pain and hematuria. ; ; Musculoskeletal: Negative for back pain and neck pain. Negative for swelling and trauma.; ; Skin: Negative for pruritus, rash, abrasions, blisters, bruising and skin lesion.; ; Neuro: +headache. Negative for lightheadedness and neck stiffness. Negative for weakness, altered level of consciousness, altered mental status, extremity weakness, paresthesias, involuntary movement, seizure and syncope.        Physical Exam Updated Vital Signs BP 124/66 (BP Location: Left Arm)   Pulse (!) 112   Temp 98.7 F (37.1 C) (Oral)   Resp 18   Wt 220 lb (99.8 kg)   LMP 11/14/2016 (Exact Date)   SpO2 99%   BMI 33.45 kg/m    BP 105/63   Pulse (!) 102   Temp 98.7 F (37.1 C) (Oral)   Resp 18   Wt 220 lb (99.8 kg)   LMP 11/14/2016 (Exact Date)   SpO2 100%   BMI 33.45 kg/m    Physical Exam 0740: Physical examination:  Nursing notes reviewed; Vital signs and O2 SAT reviewed;  Constitutional: Well developed, Well nourished, Well hydrated, In no acute distress; Head:  Normocephalic, atraumatic; Eyes: EOMI, PERRL, No scleral icterus; ENMT: TM's clear bilat. +edemetous nasal turbinates bilat with clear rhinorrhea. Mouth and pharynx normal, Mucous membranes moist; Neck: Supple, Full range of motion, No lymphadenopathy; Cardiovascular: Regular rate and rhythm, No gallop; Respiratory: Breath sounds clear & equal bilaterally, No wheezes.  Speaking full sentences with ease, Normal respiratory effort/excursion; Chest: Nontender, Movement normal; Abdomen: Soft, Nontender, Nondistended, Normal bowel sounds; Genitourinary: No  CVA tenderness; Extremities: Pulses normal, No tenderness, No edema, No calf edema or asymmetry.; Neuro: AA&Ox3, Major CN grossly intact.  Speech clear. No gross focal motor or sensory deficits in extremities.; Skin: Color normal, Warm, Dry.   ED Treatments / Results  Labs (all labs ordered are listed, but only abnormal results are displayed)   EKG  EKG Interpretation None       Radiology   Procedures Procedures (including critical care time)  Medications Ordered in ED Medications  ketorolac (TORADOL) injection 60 mg (not administered)  diphenhydrAMINE (BENADRYL) injection 50 mg (not administered)  metoCLOPramide (REGLAN) injection 10 mg (not administered)  dexamethasone (DECADRON) tablet 8 mg (8 mg Oral Given 12/09/16 0803)     Initial Impression / Assessment and Plan / ED Course  I have reviewed the triage vital signs and the nursing notes.  Pertinent labs & imaging results that were available during my care of the patient were reviewed by me and considered in my medical decision making (see chart for details).  MDM Reviewed: previous chart, nursing note and vitals   Results for orders placed or performed during the hospital encounter of 12/09/16  I-stat Chem 8, ED  Result Value Ref Range   Sodium 137 135 - 145 mmol/L   Potassium 4.1 3.5 - 5.1 mmol/L   Chloride 102 101 - 111 mmol/L   BUN 4 (L) 6 - 20 mg/dL   Creatinine, Ser 6.21 0.44 - 1.00 mg/dL   Glucose, Bld 95 65 - 99 mg/dL   Calcium, Ion 3.08 6.57 - 1.40 mmol/L   TCO2 25 0 - 100 mmol/L   Hemoglobin 11.9 (L) 12.0 - 15.0 g/dL   HCT 84.6 (L) 96.2 - 95.2 %    0900:  Pt complaining loudly: demanding "labs" and "IV fluids," telling ED staff she was "going to call (doctor's boss) and pull the race card."  I tried to explain to patient that it was not standard practice to draw labs for a routine migraine headache, and I was treating her as per usual practice (IM/PO meds, PO fluids challenge) with medications that  were routine treatment for migraine headache. Pt began to escalate behavior further and I left the exam room.   1110:  Pt has slept after being given meds; now awake. Has tol PO well while in the ED without N/V. Feels better and wants to go home now. Dx and testing d/w pt and family.  Questions answered.  Verb understanding, agreeable to d/c home with outpt f/u.      Final Clinical Impressions(s) / ED Diagnoses   Final diagnoses:  None    New Prescriptions New Prescriptions   No medications on file     Samuel Jester, DO 12/14/16 1932

## 2016-12-09 NOTE — Discharge Instructions (Signed)
Take over the counter tylenol, ibuprofen and benadryl, as directed on packaging, with the prescription given to you today, as needed for headache. Call your regular medical doctor today to schedule a follow up appointment within the next 2 to 3  days.  Return to the Emergency Department immediately sooner if worsening.

## 2016-12-09 NOTE — ED Notes (Signed)
Pt states that she would rather have IV fluids and medications. MD made aware.

## 2016-12-09 NOTE — ED Notes (Signed)
EDP McManus at bedside with patient.

## 2016-12-09 NOTE — ED Triage Notes (Signed)
Pt reports her head started hurting Wednesday night after work and has gotten worse. Pain in left eye and goes down left side of neck. Pt has taken tylenol and motrin without relief. Hasnt had migraine in 4 yrs

## 2016-12-12 ENCOUNTER — Other Ambulatory Visit: Payer: Self-pay | Admitting: *Deleted

## 2016-12-12 NOTE — Patient Outreach (Signed)
[facility]) Care Management  12/12/2016  Ana Pace [birth year] [id number]   Subjective: Telephone call to patient's home  / mobile number, no answer, left HIPAA compliant voicemail message, and requested call back.   Objective: Per chart and KPN (point of care tool) review, patient had ED visit on 12/09/16 for migraine, 11/24/16 for leg pain, and 10/30/16 for back pain.  Patient has also had hospitalization  10/07/16 - 10/08/16 for Herniated nucleus pulposis, right L3-4, and  lumbar radiculopathy.   Status post Right L3-4 microdiscectomy on 10/07/16.  [facility] Care Management completed ED follow up call on 10/04/16.   Patient had ED visit on 09/23/16 - 09/24/16 for urinary retention and increased back pain, transferred from [facility] to [facility]) for MRI follow up. Also had ED visit on 08/16/16 for back pain, 04/22/16 for palpations (sinus tachycardia), and on 03/31/16 for back pain. Patient also has a history of Endometriosis.   Assessment: Received ED census report referral on 12/12/16. Referral trigger: Patient has had 6 ED visits in the past 6 months. ED follow up pending patient contact.    Plan: RNCM will call patient for 2nd telephone outreach attempt, ED utilization  follow up, within 10 business days if no return call.     Carlin Attridge H. Gardiner Barefoot, BSN, CCM [facility] Care Management [facility] Telephonic CM Phone: [phone] Fax: [phone]

## 2016-12-13 ENCOUNTER — Other Ambulatory Visit: Payer: Self-pay | Admitting: *Deleted

## 2016-12-13 NOTE — Patient Outreach (Signed)
[facility]) Care Management  12/13/2016  Ana Pace 1980/09/16 [id number]   Subjective: Telephone call to patient's home  / mobile number, no answer, left HIPAA compliant voicemail message, and requested call back.   Objective: Per chart and KPN (point of care tool) review, patient had ED visit on 12/09/16 for migraine, 11/24/16 for leg pain, and 10/30/16 for back pain.  Patient has also had hospitalization  10/07/16 - 10/08/16 for Herniated nucleus pulposis, right L3-4, and  lumbar radiculopathy.   Status post Right L3-4 microdiscectomy on 10/07/16.  [facility] Care Management completed ED follow up call on 10/04/16.   Patient had ED visit on 09/23/16 - 09/24/16 for urinary retention and increased back pain, transferred from [facility] to [facility]) for MRI follow up. Also had ED visit on 08/16/16 for back pain, 04/22/16 for palpations (sinus tachycardia), and on 03/31/16 for back pain. Patient also has a history of Endometriosis.   Assessment: Received ED census report referral on 12/12/16. Referral trigger: Patient has had 6 ED visits in the past 6 months. ED follow up pending patient contact.    Plan: RNCM will call patient for 3rd telephone outreach attempt, ED utilization  follow up, within 10 business days if no return call.     Ledia Hanford H. Gardiner Barefoot, BSN, CCM [facility] Care Management [facility] Telephonic CM Phone: [phone] Fax: [phone]

## 2016-12-14 ENCOUNTER — Encounter: Payer: Self-pay | Admitting: *Deleted

## 2016-12-14 ENCOUNTER — Other Ambulatory Visit: Payer: Self-pay | Admitting: *Deleted

## 2016-12-14 NOTE — Patient Outreach (Addendum)
[facility]) Care Management  12/14/2016  Ana Pace 1981/06/18 [id number]   Subjective: Telephone call to patient's 2nd home number [phone]), message states invalid phone number, and unable to leave a message.  Telephone call to patient's home / mobile number, no answer, left HIPAA compliant voicemail message, and requested call back.   Objective: Per chart and KPN (point of care tool) review, patient had ED visit on 12/09/16 for migraine, 11/24/16 for leg pain, and 10/30/16 for back pain. Patient has also had hospitalization 10/07/16 - 10/08/16 for Herniated nucleus pulposis, right L3-4, and lumbar radiculopathy. Status post Right L3-4 microdiscectomy on 10/07/16. [facility] Care Management completed ED follow up call on 10/04/16.  Patient had ED visit on 09/23/16 - 09/24/16 for urinary retention and increased back pain, transferred from [facility] to [facility]) for MRI follow up. Also had ED visit on 08/16/16 for back pain, 04/22/16 for palpations (sinus tachycardia), and on 03/31/16 for back pain. Patient also has a history of Endometriosis.   Assessment: Received ED census report referral on 12/12/16. Referral trigger: Patient has had 6 ED visits in the past 6 months. ED follow up pending patient contact.    Plan: RNCM will send unsuccessful outreach  letter, [facility] pamphlet, and proceed with case closure, within 10 business days if no return call.     Tamsyn Owusu H. Gardiner Barefoot, BSN, CCM [facility] Care Management [facility] Telephonic CM Phone: [phone] Fax: [phone]

## 2016-12-29 DIAGNOSIS — Z6832 Body mass index (BMI) 32.0-32.9, adult: Secondary | ICD-10-CM | POA: Diagnosis not present

## 2016-12-29 DIAGNOSIS — L03311 Cellulitis of abdominal wall: Secondary | ICD-10-CM | POA: Diagnosis not present

## 2016-12-29 DIAGNOSIS — M5441 Lumbago with sciatica, right side: Secondary | ICD-10-CM | POA: Diagnosis not present

## 2016-12-29 DIAGNOSIS — B373 Candidiasis of vulva and vagina: Secondary | ICD-10-CM | POA: Diagnosis not present

## 2017-01-03 ENCOUNTER — Other Ambulatory Visit: Payer: Self-pay | Admitting: *Deleted

## 2017-01-03 NOTE — Patient Outreach (Signed)
[facility] Care Management  01/03/2017  Ana Pace 06/23/1981 [id number]   No response from patient outreach attempts will proceed with case closure.    Objective: Per chart and KPN (point of care tool) review, patient had ED visit on 12/09/16 for migraine, 11/24/16 for leg pain, and 10/30/16 for back pain. Patient has also had hospitalization 10/07/16 - 10/08/16 for Herniated nucleus pulposis, right L3-4, and lumbar radiculopathy. Status post Right L3-4 microdiscectomy on 10/07/16. [facility] completed ED follow up call on 10/04/16.  Patient had ED visit on 09/23/16 - 09/24/16 for urinary retention and increased back pain, [facility] for MRI follow up. Also had ED visit on 08/16/16 for back pain, 04/22/16 for palpations (sinus tachycardia), and on 03/31/16 for back pain. Patient also has a history of Endometriosis.   Assessment: Received ED census report referral on 12/12/16. Referral trigger: Patient has had 6 ED visits in the past 6 months. ED follow up not completed due to patient unable to contact, and will proceed with case closure.   Plan: RNCM will send case closure due to unable to contact request to Iverson AlaminLaura Greeson at [facility] Care Management.      Faige Seely H. Gardiner Barefootooper RN, BSN, CCM [facility] Care [facility] Telephonic CM Phone: [phone] Fax: [phone]

## 2017-03-23 DIAGNOSIS — Z6830 Body mass index (BMI) 30.0-30.9, adult: Secondary | ICD-10-CM | POA: Diagnosis not present

## 2017-03-23 DIAGNOSIS — N760 Acute vaginitis: Secondary | ICD-10-CM | POA: Diagnosis not present

## 2017-03-28 ENCOUNTER — Emergency Department (HOSPITAL_COMMUNITY): Payer: 59

## 2017-03-28 ENCOUNTER — Encounter (HOSPITAL_COMMUNITY): Payer: Self-pay | Admitting: *Deleted

## 2017-03-28 ENCOUNTER — Emergency Department (HOSPITAL_COMMUNITY)
Admission: EM | Admit: 2017-03-28 | Discharge: 2017-03-28 | Disposition: A | Discharge status: Home / Self Care | Payer: 59 | Attending: Emergency Medicine | Admitting: Emergency Medicine

## 2017-03-28 DIAGNOSIS — Z87891 Personal history of nicotine dependence: Secondary | ICD-10-CM | POA: Diagnosis not present

## 2017-03-28 DIAGNOSIS — Z79899 Other long term (current) drug therapy: Secondary | ICD-10-CM | POA: Diagnosis not present

## 2017-03-28 DIAGNOSIS — M545 Low back pain, unspecified: Secondary | ICD-10-CM

## 2017-03-28 DIAGNOSIS — M79642 Pain in left hand: Secondary | ICD-10-CM | POA: Diagnosis not present

## 2017-03-28 DIAGNOSIS — S6992XA Unspecified injury of left wrist, hand and finger(s), initial encounter: Secondary | ICD-10-CM | POA: Diagnosis not present

## 2017-03-28 DIAGNOSIS — W228XXA Striking against or struck by other objects, initial encounter: Secondary | ICD-10-CM | POA: Insufficient documentation

## 2017-03-28 MED ORDER — OXYCODONE-ACETAMINOPHEN 5-325 MG PO TABS: 1.0000 | ORAL_TABLET | ORAL | 0 refills | Status: DC | PRN
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
## 2017-03-28 NOTE — ED Notes (Signed)
6 pk percocet 5-325mg  given
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
## 2017-03-28 NOTE — Discharge Instructions (Signed)
Xrays of left hand and lower back show no fracture. Medication for pain. Ice pack.

## 2017-04-03 MED FILL — Oxycodone w/ Acetaminophen Tab 5-325 MG: ORAL | Qty: 6 | Status: AC

## 2017-04-20 DIAGNOSIS — Z01419 Encounter for gynecological examination (general) (routine) without abnormal findings: Secondary | ICD-10-CM | POA: Diagnosis not present

## 2017-04-20 DIAGNOSIS — Z124 Encounter for screening for malignant neoplasm of cervix: Secondary | ICD-10-CM | POA: Diagnosis not present

## 2017-04-25 ENCOUNTER — Emergency Department (HOSPITAL_COMMUNITY): Payer: 59

## 2017-04-25 ENCOUNTER — Encounter (HOSPITAL_COMMUNITY): Payer: Self-pay | Admitting: *Deleted

## 2017-04-25 ENCOUNTER — Emergency Department (HOSPITAL_COMMUNITY)
Admission: EM | Admit: 2017-04-25 | Discharge: 2017-04-25 | Disposition: A | Discharge status: Home / Self Care | Payer: 59 | Attending: Emergency Medicine | Admitting: Emergency Medicine

## 2017-04-25 DIAGNOSIS — R05 Cough: Secondary | ICD-10-CM | POA: Diagnosis not present

## 2017-04-25 DIAGNOSIS — R110 Nausea: Secondary | ICD-10-CM | POA: Diagnosis not present

## 2017-04-25 DIAGNOSIS — Z87891 Personal history of nicotine dependence: Secondary | ICD-10-CM | POA: Diagnosis not present

## 2017-04-25 DIAGNOSIS — Z79899 Other long term (current) drug therapy: Secondary | ICD-10-CM | POA: Diagnosis not present

## 2017-04-25 DIAGNOSIS — J069 Acute upper respiratory infection, unspecified: Secondary | ICD-10-CM | POA: Diagnosis not present

## 2017-04-25 DIAGNOSIS — R1013 Epigastric pain: Secondary | ICD-10-CM | POA: Insufficient documentation

## 2017-04-25 LAB — CBC
HCT: 38.7 % (ref 36.0–46.0)
Hemoglobin: 13.2 g/dL (ref 12.0–15.0)
MCH: 30.2 pg (ref 26.0–34.0)
MCHC: 34.1 g/dL (ref 30.0–36.0)
MCV: 88.6 fL (ref 78.0–100.0)
PLATELETS: 344 10*3/uL (ref 150–400)
RBC: 4.37 MIL/uL (ref 3.87–5.11)
RDW: 12.7 % (ref 11.5–15.5)
WBC: 13.1 10*3/uL — ABNORMAL HIGH (ref 4.0–10.5)

## 2017-04-25 LAB — COMPREHENSIVE METABOLIC PANEL
ALBUMIN: 4.2 g/dL (ref 3.5–5.0)
ALK PHOS: 91 U/L (ref 38–126)
ALT: 26 U/L (ref 14–54)
AST: 22 U/L (ref 15–41)
Anion gap: 11 (ref 5–15)
BILIRUBIN TOTAL: 0.4 mg/dL (ref 0.3–1.2)
BUN: 8 mg/dL (ref 6–20)
CALCIUM: 9.3 mg/dL (ref 8.9–10.3)
CO2: 23 mmol/L (ref 22–32)
CREATININE: 0.91 mg/dL (ref 0.44–1.00)
Chloride: 101 mmol/L (ref 101–111)
GFR calc Af Amer: >60 mL/min (ref >60)
GFR calc non Af Amer: >60 mL/min (ref >60)
GLUCOSE: 115 mg/dL — AB (ref 65–99)
Potassium: 4.2 mmol/L (ref 3.5–5.1)
SODIUM: 135 mmol/L (ref 135–145)
Total Protein: 7.8 g/dL (ref 6.5–8.1)

## 2017-04-25 LAB — URINALYSIS, ROUTINE W REFLEX MICROSCOPIC
Bacteria, UA: NONE SEEN
Bilirubin Urine: NEGATIVE
GLUCOSE, UA: NEGATIVE mg/dL
KETONES UR: 20 mg/dL — AB
Leukocytes, UA: NEGATIVE
Nitrite: NEGATIVE
PH: 5 (ref 5.0–8.0)
PROTEIN: 30 mg/dL — AB
SPECIFIC GRAVITY, URINE: 1.018 (ref 1.005–1.030)

## 2017-04-25 LAB — LIPASE, BLOOD: Lipase: 23 U/L (ref 11–51)

## 2017-04-25 LAB — PREGNANCY, URINE: Preg Test, Ur: NEGATIVE

## 2017-04-25 MED ORDER — SODIUM CHLORIDE 0.9 % IV BOLUS (SEPSIS)
1000.0000 mL | Freq: Once | INTRAVENOUS | Status: AC
  Administered 2017-04-25: 1000 mL via INTRAVENOUS

## 2017-04-25 MED ORDER — GI COCKTAIL ~~LOC~~
30.0000 mL | Freq: Once | ORAL | Status: AC
  Administered 2017-04-25: 30 mL via ORAL
  Filled 2017-04-25: qty 30

## 2017-04-25 MED ORDER — ONDANSETRON HCL 4 MG/2ML IJ SOLN
4.0000 mg | Freq: Once | INTRAMUSCULAR | Status: AC | PRN
  Administered 2017-04-25: 4 mg via INTRAVENOUS
  Filled 2017-04-25: qty 2

## 2017-04-25 MED ORDER — KETOROLAC TROMETHAMINE 30 MG/ML IJ SOLN
30.0000 mg | Freq: Once | INTRAMUSCULAR | Status: AC
  Administered 2017-04-25: 30 mg via INTRAVENOUS
  Filled 2017-04-25: qty 1

## 2017-04-25 MED ORDER — ONDANSETRON HCL 4 MG PO TABS: 4.0000 mg | ORAL_TABLET | Freq: Three times a day (TID) | ORAL | 0 refills | Status: DC | PRN

## 2017-04-25 NOTE — Discharge Instructions (Signed)
Please read attached information regarding your condition. Take Zofran as needed for nausea. Follow-up with PCP for further evaluation. Return to ED for worsening pain, trouble breathing, chest pain, shortness of breath, blood in vomit or stool.

## 2017-04-25 NOTE — ED Notes (Signed)
Pt given oral fluids and crackers. Tolerated well.

## 2017-04-25 NOTE — ED Triage Notes (Signed)
Pt states she has been having feeling of being tired and nauseous since Friday. She felt better over the weekend but got worse yesterday. Denies any vomiting. States she is having epigastric pain as well. Denies any chest pain. Fevers and chills were present yesterday. VS stable.

## 2017-04-25 NOTE — ED Notes (Signed)
ED Provider at bedside. 

## 2017-04-25 NOTE — ED Provider Notes (Signed)
[facility] DE[facility] Provider Note   CSN: [id number] Arrival date & time: 04/25/17  4098     History   Chief Complaint Chief Complaint  Patient presents with  . Nausea    HPI Ana Pace is a 36 y.o. female.  HPI  Patient, with a past medical history of kidney stones, ovarian cyst, GERD presents to ED for evaluation of epigastric abdominal pain and vomiting for the past day. She's been having intermittent fevers, chills and cold symptoms for the past 4 days. She has tried TheraFlu with mild relief in her symptoms. She is unsure if the epigastric pain is due to her chest orabdomen. One episode of diarrhea yesterday with no hematochezia or melena. Denies any urinary symptoms, vaginal complaints, blood in vomit, constipation. She states that her 26-year-old daughter has similar cold symptoms.  Past Medical History:  Diagnosis Date  . Abnormal Pap smear   . Anxiety   . Chronic back pain   . Endometrial hyperplasia without atypia, simple   . Endometriosis   . GERD (gastroesophageal reflux disease)    tums prn  . Herniated nucleus pulposus of lumbosacral region   . Kidney stone   . Ovarian cyst     Patient Active Problem List   Diagnosis Date Noted  . HNP (herniated nucleus pulposus), lumbar 10/07/2016  . Herniated nucleus pulposus 10/07/2016    Past Surgical History:  Procedure Laterality Date  . abdominalplasty  11/18/2016  . BREAST ENHANCEMENT SURGERY    . CESAREAN SECTION     x3  . CESAREAN SECTION  04/02/2012   Procedure: CESAREAN SECTION;  Surgeon: Antionette Char, MD;  Location: [facility] ORS;  Service: Obstetrics;  Laterality: N/A;  Repeat cesarean section with delivery of baby girl at 55.  . CHOLECYSTECTOMY    . DIAGNOSTIC LAPAROSCOPY     endometriosis  . LITHOTRIPSY    . LUMBAR LAMINECTOMY/DECOMPRESSION MICRODISCECTOMY Right 10/07/2016   Procedure: Right Lumbar three-four Microdiscectomy;  Surgeon: Loura Halt Ditty, MD;  Location: [facility] OR;  Service:  Neurosurgery;  Laterality: Right;    OB History    Gravida Para Term Preterm AB Living   SAB TAB Ectopic Multiple Live Births           1       Home Medications    Prior to Admission medications   Medication Sig Start Date End Date Taking? Authorizing Provider  acetaminophen (TYLENOL) 500 MG tablet Take 1,000 mg by mouth daily as needed for mild pain or headache.    [provider]  ferrous sulfate 325 (65 FE) MG EC tablet Take 1 tablet by mouth daily with breakfast.    [provider]  Homeopathic Products (ARNICA-HEEL PO) Take 1 tablet by mouth daily.    [provider]  HYDROcodone-acetaminophen (NORCO/VICODIN) 5-325 MG tablet Take 1 tablet by mouth every 6 (six) hours as needed for moderate pain.    [provider]  ibuprofen (ADVIL,MOTRIN) 200 MG tablet Take 800 mg by mouth every 6 (six) hours as needed for mild pain.    [provider]  metoCLOPramide (REGLAN) 10 MG tablet Take 1 tablet (10 mg total) by mouth every 6 (six) hours as needed for nausea (or headache). 12/09/16   Samuel Jester, DO  Multiple Vitamins-Minerals (ZINC PO) Take 1 tablet by mouth daily.    [provider]  ondansetron (ZOFRAN) 4 MG tablet Take 1 tablet (4 mg total) by mouth every 8 (  eight) hours as needed for nausea or vomiting. 04/25/17   Kenric Ginger, PA-C  oxyCODONE-acetaminophen (PERCOCET) 5-325 MG tablet Take 1-2 tablets by mouth every 4 (four) hours as needed. 03/28/17   Donnetta Hutchingook, Brian, MD  phentermine (ADIPEX-P) 37.5 MG tablet Take 37.5 mg by mouth daily. 12/08/16   [provider]    Family History Family History  Problem Relation Age of Onset  . Hypertension Mother     Social History Social History  Substance Use Topics  . Smoking status: Former Smoker    Years: 3.00    Types: Cigarettes    Quit date: 09/05/2007  . Smokeless tobacco: Never Used  . Alcohol use Yes     Comment: occasional     Allergies     Morphine and related and Codeine   Review of Systems Review of Systems  Constitutional: Negative for appetite change, chills and fever.  HENT: Negative for ear pain, rhinorrhea, sneezing and sore throat.   Eyes: Negative for photophobia and visual disturbance.  Respiratory: Negative for cough, chest tightness, shortness of breath and wheezing.   Cardiovascular: Negative for chest pain and palpitations.  Gastrointestinal: Positive for abdominal pain and nausea. Negative for blood in stool, constipation, diarrhea and vomiting.  Genitourinary: Negative for dysuria, hematuria and urgency.  Musculoskeletal: Negative for myalgias.  Skin: Negative for rash.  Neurological: Negative for dizziness, weakness and light-headedness.     Physical Exam Updated Vital Signs BP 112/82 (BP Location: Left Arm)   Pulse 66   Temp 98.8 F (37.1 C) (Oral)   Resp 18   Ht 5\' 8"  (1.727 m)   Wt 83 kg (183 lb)   LMP 04/23/2017   SpO2 100%   BMI 27.83 kg/m   Physical Exam  Constitutional: She appears well-developed and well-nourished. No distress.  Nontoxic appearing but does appear uncomfortable.  HENT:  Head: Normocephalic and atraumatic.  Nose: Nose normal.  Eyes: Conjunctivae and EOM are normal. Left eye exhibits no discharge. No scleral icterus.  Neck: Normal range of motion. Neck supple.  Cardiovascular: Normal rate, regular rhythm, normal heart sounds and intact distal pulses.  Exam reveals no gallop and no friction rub.   No murmur heard. Pulmonary/Chest: Effort normal and breath sounds normal. No respiratory distress.  Abdominal: Soft. Bowel sounds are normal. She exhibits no distension. There is tenderness. There is no guarding.    Tenderness to palpation of the epigastric area.  Musculoskeletal: Normal range of motion. She exhibits no edema.  Neurological: She is alert. She exhibits normal muscle tone. Coordination normal.  Skin: Skin is warm and dry. No rash noted.  Psychiatric: She  has a normal mood and affect.  Nursing note and vitals reviewed.    ED Treatments / Results  Labs (all labs ordered are listed, but only abnormal results are displayed) Labs Reviewed  COMPREHENSIVE METABOLIC PANEL - Abnormal; Notable for the following:       Result Value   Glucose, Bld 115 (*)    All other components within normal limits  CBC - Abnormal; Notable for the following:    WBC 13.1 (*)    All other components within normal limits  URINALYSIS, ROUTINE W REFLEX MICROSCOPIC - Abnormal; Notable for the following:    APPearance HAZY (*)    Hgb urine dipstick LARGE (*)    Ketones, ur 20 (*)    Protein, ur 30 (*)    Squamous Epithelial / LPF 0-5 (*)    All other components within normal limits  LIPASE, BLOOD  PREGNANCY, URINE    EKG  EKG Interpretation None       Radiology Dg Chest 2 View  Result Date: 04/25/2017 CLINICAL DATA:  Cold, fever, chills, body aches, cough EXAM: CHEST  2 VIEW COMPARISON:  09/02/2015 FINDINGS: Heart and mediastinal contours are within normal limits. No focal opacities or effusions. No acute bony abnormality. IMPRESSION: No active cardiopulmonary disease. Electronically Signed   By: Charlett Nose M.D.   On: 04/25/2017 10:53    Procedures Procedures (including critical care time)  Medications Ordered in ED Medications  ondansetron (ZOFRAN) injection 4 mg (4 mg Intravenous Given 04/25/17 1004)  sodium chloride 0.9 % bolus 1,000 mL (0 mLs Intravenous Stopped 04/25/17 1145)  gi cocktail (Maalox,Lidocaine,Donnatal) (30 mLs Oral Given 04/25/17 1030)  ketorolac (TORADOL) 30 MG/ML injection 30 mg (30 mg Intravenous Given 04/25/17 1030)     Initial Impression / Assessment and Plan / ED Course  I have reviewed the triage vital signs and the nursing notes.  Pertinent labs & imaging results that were available during my care of the patient were reviewed by me and considered in my medical decision making (see chart for details).     Patient  presents to ED for evaluation of nausea and abdominal pain that began today. She has had a history of cold symptoms for the past few days. Also reports intermittent fevers. Sick contacts with similar symptoms. On physical exam she does appear uncomfortable due to pain. However she has normal breath sounds, normal oxygenation. She is afebrile here. There is tenderness to palpation in the epigastric area of the abdomen. CBC shows elevated white count at 13. CMP, lipase unremarkable. Urinalysis without evidence of UTI. Urine pregnancy unremarkable. Chest x-ray negative. Patient reports some improvement in symptoms with Toradol, GI cocktail and fluids. I suspect that this could be due to the viral illness that she has been experiencing for the past few days. I have low suspicion for acute surgical abdomen or other infectious cause of her abdominal pain. She states that she will probably feel better with nausea medication at home and rest. I agreed with this plan. Patient given Zofran to be taken as needed and advised to follow-up with PCP for further evaluation if symptoms persist. Patient appears stable for discharge at this time. Strict return precautions given.  Final Clinical Impressions(s) / ED Diagnoses   Final diagnoses:  Nausea  Epigastric pain  Viral URI    New Prescriptions Discharge Medication List as of 04/25/2017 11:37 AM       Dietrich Pates, PA-C 04/25/17 1330    Rolland Porter, MD 05/01/17 1055

## 2017-04-26 DIAGNOSIS — A6000 Herpesviral infection of urogenital system, unspecified: Secondary | ICD-10-CM | POA: Diagnosis not present

## 2017-04-26 DIAGNOSIS — Z8619 Personal history of other infectious and parasitic diseases: Secondary | ICD-10-CM | POA: Diagnosis not present

## 2017-04-26 DIAGNOSIS — Z6828 Body mass index (BMI) 28.0-28.9, adult: Secondary | ICD-10-CM | POA: Diagnosis not present

## 2017-05-01 IMAGING — CR DG LUMBAR SPINE COMPLETE 4+V
5 series · 5 of 5 positions shown · non-contrast
Comparison: Lumbar spine radiograph dated 10/07/2016

CLINICAL DATA: 35-year-old female with fall and back pain.

EXAM:
LUMBAR SPINE - COMPLETE 4+ VIEW

[l-spine ap]
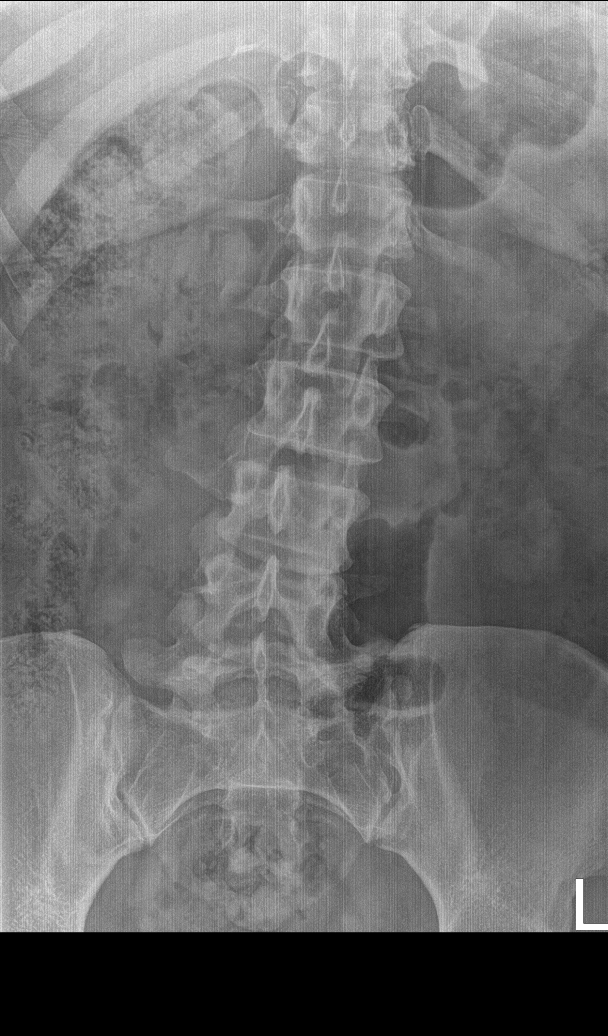

[l-spine obl (1 of 2)]
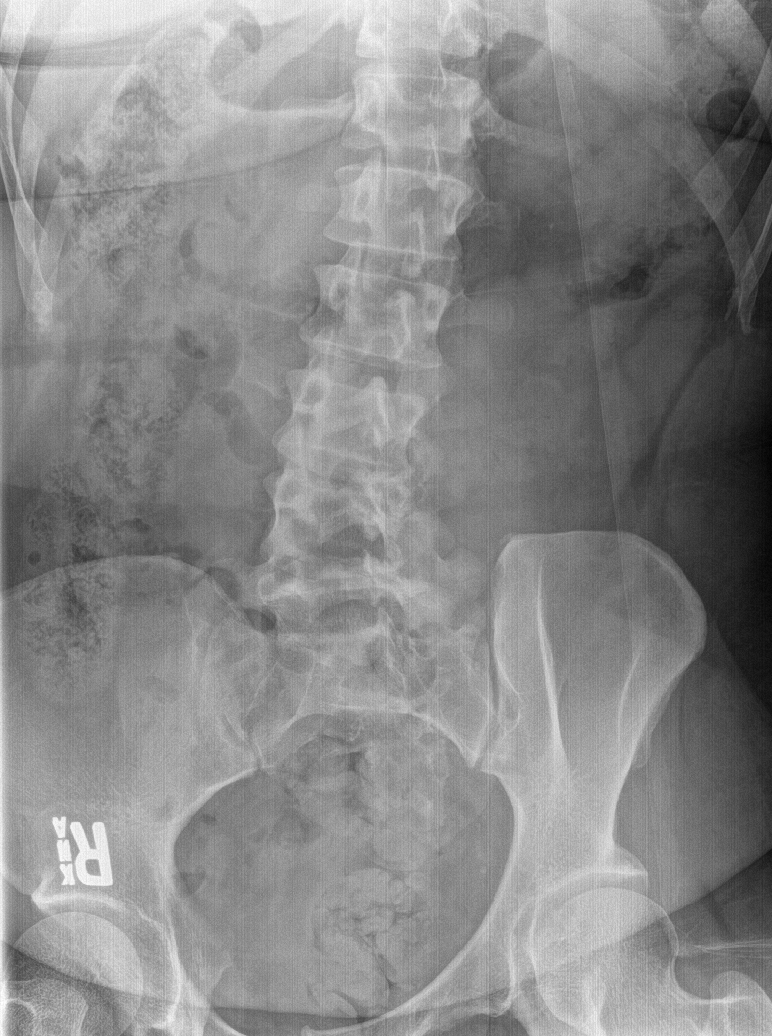

[l-spine obl (2 of 2)]
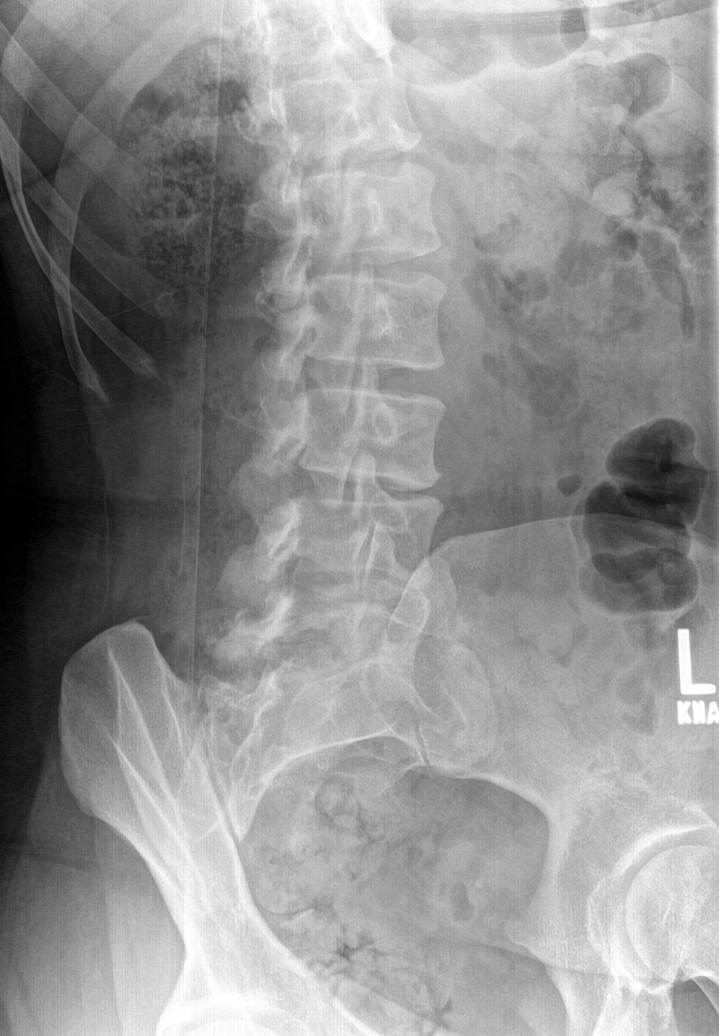

[l-spine lat]
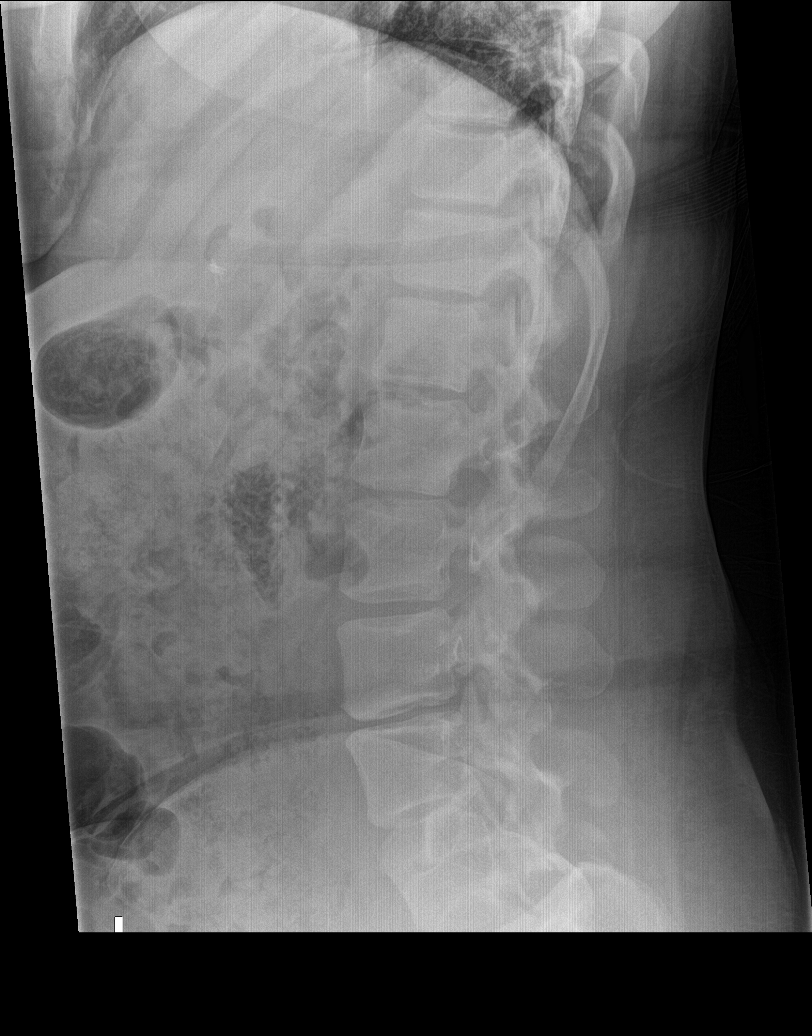

[l-spine spot]
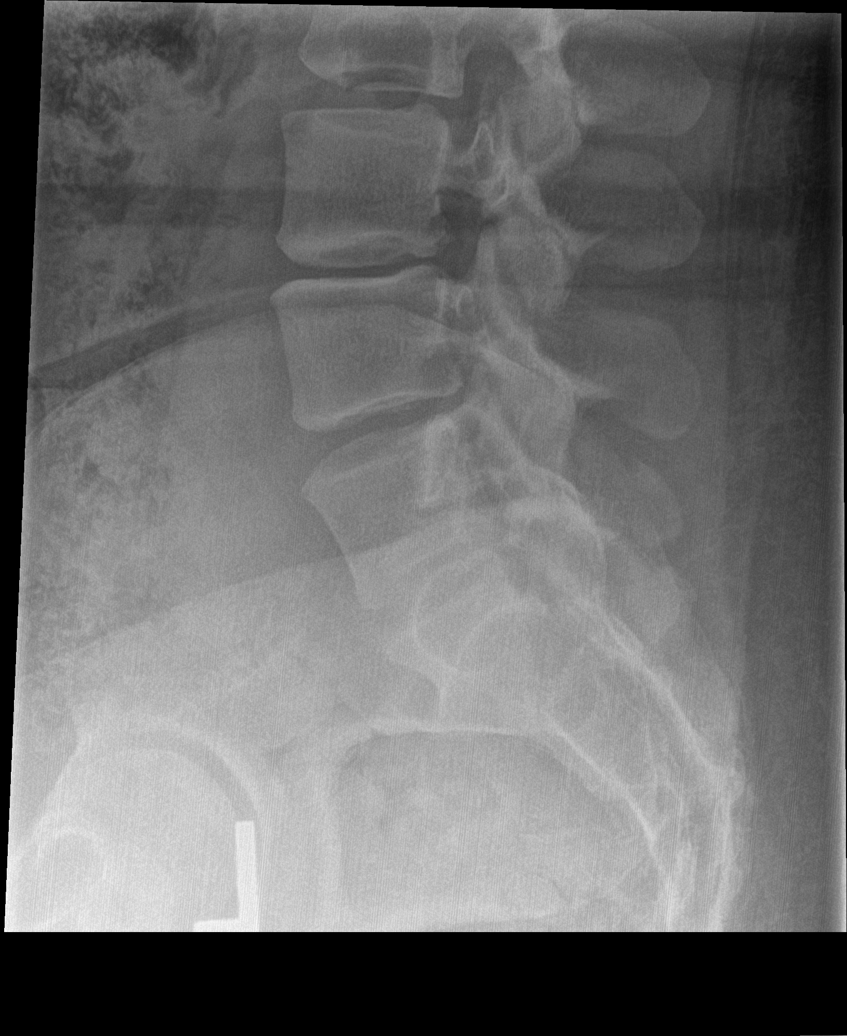

[5 of 5 positions shown; findings below may reference images not displayed]

FINDINGS: There is no acute fracture or subluxation of the lumbar spine. The
vertebral body heights and disc spaces are maintained. There is mild
levoscoliosis. The visualized transverse and spinous processes
appear intact. The soft tissues appear unremarkable. Punctate
densities over the left renal silhouette may represent renal calculi
versus stool within the colon.
IMPRESSION: No acute/ traumatic lumbar spine pathology.

## 2017-05-10 DIAGNOSIS — R1906 Epigastric swelling, mass or lump: Secondary | ICD-10-CM | POA: Diagnosis not present

## 2017-05-10 DIAGNOSIS — M791 Myalgia: Secondary | ICD-10-CM | POA: Diagnosis not present

## 2017-05-10 DIAGNOSIS — Z6828 Body mass index (BMI) 28.0-28.9, adult: Secondary | ICD-10-CM | POA: Diagnosis not present

## 2017-05-10 DIAGNOSIS — L03116 Cellulitis of left lower limb: Secondary | ICD-10-CM | POA: Diagnosis not present

## 2017-05-21 ENCOUNTER — Ambulatory Visit (HOSPITAL_COMMUNITY)
Admission: EM | Admit: 2017-05-21 | Discharge: 2017-05-21 | Disposition: A | Discharge status: Home / Self Care | Payer: 59 | Attending: Urgent Care | Admitting: Urgent Care

## 2017-05-21 ENCOUNTER — Encounter (HOSPITAL_COMMUNITY): Payer: Self-pay | Admitting: *Deleted

## 2017-05-21 DIAGNOSIS — M5136 Other intervertebral disc degeneration, lumbar region: Secondary | ICD-10-CM

## 2017-05-21 DIAGNOSIS — Z9889 Other specified postprocedural states: Secondary | ICD-10-CM

## 2017-05-21 DIAGNOSIS — M545 Low back pain, unspecified: Secondary | ICD-10-CM

## 2017-05-21 DIAGNOSIS — M5127 Other intervertebral disc displacement, lumbosacral region: Secondary | ICD-10-CM | POA: Diagnosis not present

## 2017-05-21 DIAGNOSIS — M5126 Other intervertebral disc displacement, lumbar region: Secondary | ICD-10-CM

## 2017-05-21 MED ORDER — METHYLPREDNISOLONE ACETATE 80 MG/ML IJ SUSP
INTRAMUSCULAR | Status: AC
  Filled 2017-05-21: qty 1

## 2017-05-21 MED ORDER — KETOROLAC TROMETHAMINE 60 MG/2ML IM SOLN
INTRAMUSCULAR | Status: AC
  Filled 2017-05-21: qty 2

## 2017-05-21 MED ORDER — KETOROLAC TROMETHAMINE 60 MG/2ML IM SOLN
60.0000 mg | Freq: Once | INTRAMUSCULAR | Status: AC
  Administered 2017-05-21: 60 mg via INTRAMUSCULAR

## 2017-05-21 MED ORDER — METHYLPREDNISOLONE ACETATE 80 MG/ML IJ SUSP
80.0000 mg | Freq: Once | INTRAMUSCULAR | Status: AC
  Administered 2017-05-21: 80 mg via INTRAMUSCULAR

## 2017-05-21 NOTE — ED Triage Notes (Signed)
Reports starting with low back pain while at work 2 days ago with progressive worsening.  Has tried heat packs, diclofenac, tizanidine without relief.  Denies any parasthesias or radiation of pains.

## 2017-05-21 NOTE — ED Provider Notes (Signed)
MRN: [id number] DOB: 12/16/1980  Subjective:   Ana Pace is a 36 y.o. female presenting for chief complaint of Back Pain  Reports 2 day history of recurrent severe, constant, sharp low back pain that started while at work. It has progressively worsened, has continued to work without restrictions. Has tried heat packs, diclofenace, tizanidine without any relief. She is also using a back brace/belt. Has a history of bulging disc, surgical decompression. Denies weakness, trauma, falls, radicular pain.  No current facility-administered medications for this encounter.   Current Outpatient Prescriptions:  .  DICLOFENAC PO, Take by mouth., Disp: , Rfl:  .  TIZANIDINE HCL PO, Take by mouth., Disp: , Rfl:  .  acetaminophen (TYLENOL) 500 MG tablet, Take 1,000 mg by mouth daily as needed for mild pain or headache., Disp: , Rfl:  .  ferrous sulfate 325 (65 FE) MG EC tablet, Take 1 tablet by mouth daily with breakfast., Disp: , Rfl:  .  Homeopathic Products (ARNICA-HEEL PO), Take 1 tablet by mouth daily., Disp: , Rfl:  .  HYDROcodone-acetaminophen (NORCO/VICODIN) 5-325 MG tablet, Take 1 tablet by mouth every 6 (six) hours as needed for moderate pain., Disp: , Rfl:  .  ibuprofen (ADVIL,MOTRIN) 200 MG tablet, Take 800 mg by mouth every 6 (six) hours as needed for mild pain., Disp: , Rfl:  .  metoCLOPramide (REGLAN) 10 MG tablet, Take 1 tablet (10 mg total) by mouth every 6 (six) hours as needed for nausea (or headache)., Disp: 6 tablet, Rfl: 0 .  Multiple Vitamins-Minerals (ZINC PO), Take 1 tablet by mouth daily., Disp: , Rfl:  .  ondansetron (ZOFRAN) 4 MG tablet, Take 1 tablet (4 mg total) by mouth every 8 (eight) hours as needed for nausea or vomiting., Disp: 20 tablet, Rfl: 0 .  oxyCODONE-acetaminophen (PERCOCET) 5-325 MG tablet, Take 1-2 tablets by mouth every 4 (four) hours as needed., Disp: 6 tablet, Rfl: 0 .  phentermine (ADIPEX-P) 37.5 MG tablet, Take 37.5 mg by mouth daily., Disp: , Rfl: 0    Shequila is allergic to morphine and related and codeine.  Glendine  has a past medical history of Abnormal Pap smear; Anxiety; Chronic back pain; Endometrial hyperplasia without atypia, simple; Endometriosis; GERD (gastroesophageal reflux disease); Herniated nucleus pulposus of lumbosacral region; Kidney stone; and Ovarian cyst. Also  has a past surgical history that includes Cesarean section; Diagnostic laparoscopy; Cesarean section (04/02/2012); Cholecystectomy; Lithotripsy; Lumbar laminectomy/decompression microdiscectomy (Right, 10/07/2016); abdominalplasty (11/18/2016); and Breast enhancement surgery.  Objective:   Vitals: BP 117/78   Pulse 89   Temp 98.3 F (36.8 C) (Oral)   Resp 16   LMP 05/20/2017 (Exact Date)   SpO2 98%   Physical Exam  Constitutional: She is oriented to person, place, and time. She appears well-developed and well-nourished. She appears distressed (from her back pain).  Cardiovascular: Normal rate.   Pulmonary/Chest: Effort normal.  Musculoskeletal:       Lumbar back: She exhibits decreased range of motion and tenderness. She exhibits no bony tenderness, no swelling, no edema, no deformity, no laceration and no spasm.       Back:  Neurological: She is alert and oriented to person, place, and time. She displays normal reflexes. Coordination (moving very gingerly due to her back pain) abnormal.  Skin: Skin is warm and dry.   Assessment and Plan :   Acute bilateral low back pain without sciatica  History of back surgery  Bulging lumbar disc  IM Toradol, start short steroid course, work  restrictions provided. Patient is to f/u with orthopedist, PCP.   Wallis Bamberg, PA-C [facility]  05/21/2017  8:58 PM    Wallis Bamberg, PA-C 05/22/17 1519

## 2017-05-22 ENCOUNTER — Encounter (HOSPITAL_COMMUNITY): Payer: Self-pay | Admitting: Urgent Care

## 2017-06-15 DIAGNOSIS — Z111 Encounter for screening for respiratory tuberculosis: Secondary | ICD-10-CM | POA: Diagnosis not present

## 2017-06-16 MED FILL — VALACYCLOVIR HCL 500 MG TAB: 500 | 30 days supply | Qty: 30 | Fill #0

## 2017-06-16 MED FILL — tiZANidine HCL 4 MG TABS: 4 | 20 days supply | Qty: 60 | Fill #0

## 2019-01-23 ENCOUNTER — Encounter: Payer: Self-pay | Admitting: *Deleted

## 2020-11-24 ENCOUNTER — Encounter (HOSPITAL_COMMUNITY): Payer: Self-pay | Admitting: Emergency Medicine

## 2020-11-24 ENCOUNTER — Other Ambulatory Visit: Payer: Self-pay

## 2020-11-24 ENCOUNTER — Emergency Department (HOSPITAL_COMMUNITY)
Admission: EM | Admit: 2020-11-24 | Discharge: 2020-11-24 | Disposition: A | Discharge status: Home / Self Care | Payer: Self-pay | Attending: Emergency Medicine | Admitting: Emergency Medicine

## 2020-11-24 ENCOUNTER — Emergency Department (HOSPITAL_COMMUNITY): Payer: Self-pay

## 2020-11-24 DIAGNOSIS — R1031 Right lower quadrant pain: Secondary | ICD-10-CM

## 2020-11-24 DIAGNOSIS — R1013 Epigastric pain: Secondary | ICD-10-CM

## 2020-11-24 DIAGNOSIS — R110 Nausea: Secondary | ICD-10-CM

## 2020-11-24 DIAGNOSIS — Z87891 Personal history of nicotine dependence: Secondary | ICD-10-CM | POA: Insufficient documentation

## 2020-11-24 DIAGNOSIS — K219 Gastro-esophageal reflux disease without esophagitis: Secondary | ICD-10-CM | POA: Insufficient documentation

## 2020-11-24 DIAGNOSIS — N200 Calculus of kidney: Secondary | ICD-10-CM | POA: Insufficient documentation

## 2020-11-24 LAB — COMPREHENSIVE METABOLIC PANEL
ALT: 18 U/L (ref 0–44)
AST: 18 U/L (ref 15–41)
Albumin: 4.5 g/dL (ref 3.5–5.0)
Alkaline Phosphatase: 73 U/L (ref 38–126)
Anion gap: 11 (ref 5–15)
BUN: 10 mg/dL (ref 6–20)
CO2: 26 mmol/L (ref 22–32)
Calcium: 9.1 mg/dL (ref 8.9–10.3)
Chloride: 104 mmol/L (ref 98–111)
Creatinine, Ser: 0.96 mg/dL (ref 0.44–1.00)
GFR, Estimated: >60 mL/min (ref >60)
Glucose, Bld: 87 mg/dL (ref 70–99)
Potassium: 3.3 mmol/L — ABNORMAL LOW (ref 3.5–5.1)
Sodium: 141 mmol/L (ref 135–145)
Total Bilirubin: 1.1 mg/dL (ref 0.3–1.2)
Total Protein: 7.5 g/dL (ref 6.5–8.1)

## 2020-11-24 LAB — CBC WITH DIFFERENTIAL/PLATELET
Abs Immature Granulocytes: 0.03 10*3/uL (ref 0.00–0.07)
Basophils Absolute: 0 10*3/uL (ref 0.0–0.1)
Basophils Relative: 0 %
Eosinophils Absolute: 0 10*3/uL (ref 0.0–0.5)
Eosinophils Relative: 0 %
HCT: 37.5 % (ref 36.0–46.0)
Hemoglobin: 12.5 g/dL (ref 12.0–15.0)
Immature Granulocytes: 0 %
Lymphocytes Relative: 7 %
Lymphs Abs: 0.7 10*3/uL (ref 0.7–4.0)
MCH: 30.9 pg (ref 26.0–34.0)
MCHC: 33.3 g/dL (ref 30.0–36.0)
MCV: 92.8 fL (ref 80.0–100.0)
Monocytes Absolute: 0.4 10*3/uL (ref 0.1–1.0)
Monocytes Relative: 4 %
Neutro Abs: 9 10*3/uL — ABNORMAL HIGH (ref 1.7–7.7)
Neutrophils Relative %: 89 %
Platelets: 294 10*3/uL (ref 150–400)
RBC: 4.04 MIL/uL (ref 3.87–5.11)
RDW: 12.6 % (ref 11.5–15.5)
WBC: 10.2 10*3/uL (ref 4.0–10.5)
nRBC: 0 % (ref 0.0–0.2)

## 2020-11-24 LAB — URINALYSIS, ROUTINE W REFLEX MICROSCOPIC
Bacteria, UA: NONE SEEN
Bilirubin Urine: NEGATIVE
Glucose, UA: NEGATIVE mg/dL
Ketones, ur: 20 mg/dL — AB
Leukocytes,Ua: NEGATIVE
Nitrite: NEGATIVE
Protein, ur: NEGATIVE mg/dL
Specific Gravity, Urine: 1.02 (ref 1.005–1.030)
pH: 6 (ref 5.0–8.0)

## 2020-11-24 LAB — HCG, SERUM, QUALITATIVE: Preg, Serum: NEGATIVE

## 2020-11-24 LAB — LIPASE, BLOOD: Lipase: 23 U/L (ref 11–51)

## 2020-11-24 MED ORDER — HYDROMORPHONE HCL 1 MG/ML IJ SOLN
0.5000 mg | Freq: Once | INTRAMUSCULAR | Status: AC
Start: 2020-11-24 — End: 2020-11-24
  Administered 2020-11-24: 0.5 mg via INTRAVENOUS
  Filled 2020-11-24: qty 1

## 2020-11-24 MED ORDER — IOHEXOL 300 MG/ML  SOLN
100.0000 mL | Freq: Once | INTRAMUSCULAR | Status: AC | PRN
  Administered 2020-11-24: 100 mL via INTRAVENOUS

## 2020-11-24 MED ORDER — DICYCLOMINE HCL 20 MG PO TABS: 20.0000 mg | ORAL_TABLET | Freq: Two times a day (BID) | ORAL | 0 refills | Status: AC

## 2020-11-24 MED ORDER — DICYCLOMINE HCL 10 MG PO CAPS
20.0000 mg | ORAL_CAPSULE | Freq: Once | ORAL | Status: AC
  Administered 2020-11-24: 20 mg via ORAL
  Filled 2020-11-24: qty 2

## 2020-11-24 MED ORDER — SODIUM CHLORIDE 0.9 % IV BOLUS
1000.0000 mL | Freq: Once | INTRAVENOUS | Status: AC
  Administered 2020-11-24: 1000 mL via INTRAVENOUS

## 2020-11-24 MED ORDER — ONDANSETRON 4 MG PO TBDP: ORAL_TABLET | ORAL | 0 refills | Status: DC

## 2020-11-24 MED ORDER — ALUM & MAG HYDROXIDE-SIMETH 200-200-20 MG/5ML PO SUSP
30.0000 mL | Freq: Once | ORAL | Status: AC
  Administered 2020-11-24: 30 mL via ORAL
  Filled 2020-11-24: qty 30

## 2020-11-24 MED ORDER — ONDANSETRON HCL 4 MG/2ML IJ SOLN
4.0000 mg | Freq: Once | INTRAMUSCULAR | Status: AC
  Administered 2020-11-24: 4 mg via INTRAVENOUS
  Filled 2020-11-24: qty 2

## 2020-11-24 MED ORDER — LIDOCAINE VISCOUS HCL 2 % MT SOLN
15.0000 mL | Freq: Once | OROMUCOSAL | Status: AC
  Administered 2020-11-24: 15 mL via ORAL
  Filled 2020-11-24: qty 15

## 2020-11-24 NOTE — ED Notes (Signed)
Patient transported to CT 

## 2020-11-24 NOTE — Discharge Instructions (Addendum)
Your labs and CT today been reassuring.  Suspect this may be the beginning of gastroenteritis, treat symptoms supportively with Zofran, Bentyl, Pepcid.  Drink plenty of fluids and eat a bland diet.  If you develop any new or worsening symptoms please return for reevaluation.

## 2020-11-24 NOTE — ED Provider Notes (Signed)
[facility] EMERGENCY DEPARTMENT Provider Note   CSN: [id number] Arrival date & time: 11/24/20  1733     History Chief Complaint  Patient presents with  . Abdominal Pain    Ana Pace is a 40 y.o. female.  Ana Pace is a 40 y.o. female with a history of kidney stones, endometriosis, back pain, who presents to the emergency department for evaluation of abdominal pain.  She reports this pain started around noon today.  She has been having intermittent pains in the epigastric region and started having more constant dull pain in the right lower quadrant.  She reports yesterday she was having some heartburn but did not think much of it until epigastric pain worsened today.  Has been nauseated but no vomiting.  Normal bowel movements.  No dysuria, urinary frequency.  She denies any vaginal discharge, reports she is at the very end of her menstrual cycle which has been normal.  No fevers or chills.  No history of similar pain.  Reports that she took Pepcid yesterday for heartburn, has not taken anything today for this pain.  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        Past Medical History:  Diagnosis Date  . Abnormal Pap smear   . Anxiety   . Chronic back pain   . Endometrial hyperplasia without atypia, simple   . Endometriosis   . GERD (gastroesophageal reflux disease)    tums prn  . Herniated nucleus pulposus of lumbosacral region   . Kidney stone   . Ovarian cyst     Patient Active Problem List   Diagnosis Date Noted  . HNP (herniated nucleus pulposus), lumbar 10/07/2016  . Herniated nucleus pulposus 10/07/2016    Past Surgical History:  Procedure Laterality Date  . abdominalplasty  11/18/2016  . BREAST ENHANCEMENT SURGERY    . CESAREAN SECTION     x3  . CESAREAN SECTION  04/02/2012   Procedure: CESAREAN SECTION;  Surgeon: Antionette Char, MD;  Location: [facility] ORS;  Service: Obstetrics;   Laterality: N/A;  Repeat cesarean section with delivery of baby girl at 18.  . CHOLECYSTECTOMY    . DIAGNOSTIC LAPAROSCOPY     endometriosis  . LITHOTRIPSY    . LUMBAR LAMINECTOMY/DECOMPRESSION MICRODISCECTOMY Right 10/07/2016   Procedure: Right Lumbar three-four Microdiscectomy;  Surgeon: Loura Halt Ditty, MD;  Location: [facility];  Service: Neurosurgery;  Laterality: Right;     OB History    Gravida  3   Para  3   Term  1   Preterm      AB      Living  3     SAB      IAB      Ectopic      Multiple      Live Births  1           Family History  Problem Relation Age of Onset  . Hypertension Mother     Social History   Tobacco Use  . Smoking status: Former Smoker    Years: 3.00    Types: Cigarettes    Quit date: 09/05/2007    Years since quitting: 13.2  . Smokeless tobacco: Never Used  Substance Use Topics  . Alcohol use: Yes    Comment: occasional  . Drug use: No    Home Medications Prior to Admission medications   Medication Sig Start Date End Date Taking? Authorizing  Provider  acetaminophen (TYLENOL) 500 MG tablet Take 1,000 mg by mouth daily as needed for mild pain or headache.   Yes [provider]  dicyclomine (BENTYL) 20 MG tablet Take 1 tablet (20 mg total) by mouth 2 (two) times daily. 11/24/20  Yes Dartha Lodge, PA-C  famotidine (PEPCID) 20 MG tablet Take 20 mg by mouth daily.   Yes [provider]  ibuprofen (ADVIL,MOTRIN) 200 MG tablet Take 800 mg by mouth every 6 (six) hours as needed for mild pain.   Yes [provider]  ondansetron (ZOFRAN ODT) 4 MG disintegrating tablet 4mg  ODT q4 hours prn nausea/vomit 11/24/20  Yes 01/24/21, PA-C  DICLOFENAC PO Take by mouth. Patient not taking: No sig reported    [provider]  ferrous sulfate 325 (65 FE) MG EC tablet Take 1 tablet by mouth daily with breakfast. Patient not taking: Reported on 11/24/2020    [provider]  Homeopathic Products  (ARNICA-HEEL PO) Take 1 tablet by mouth daily. Patient not taking: Reported on 11/24/2020    [provider]  HYDROcodone-acetaminophen (NORCO/VICODIN) 5-325 MG tablet Take 1 tablet by mouth every 6 (six) hours as needed for moderate pain. Patient not taking: Reported on 11/24/2020    [provider]  metoCLOPramide (REGLAN) 10 MG tablet Take 1 tablet (10 mg total) by mouth every 6 (six) hours as needed for nausea (or headache). Patient not taking: No sig reported 12/09/16   12/11/16, DO  Multiple Vitamins-Minerals (ZINC PO) Take 1 tablet by mouth daily. Patient not taking: Reported on 11/24/2020    [provider]  ondansetron (ZOFRAN) 4 MG tablet Take 1 tablet (4 mg total) by mouth every 8 (eight) hours as needed for nausea or vomiting. Patient not taking: No sig reported 04/25/17   06/25/17, PA-C  oxyCODONE-acetaminophen (PERCOCET) 5-325 MG tablet Take 1-2 tablets by mouth every 4 (four) hours as needed. Patient not taking: No sig reported 03/28/17   03/30/17, MD  phentermine (ADIPEX-P) 37.5 MG tablet Take 37.5 mg by mouth daily. Patient not taking: Reported on 11/24/2020 12/08/16   [provider]  TIZANIDINE HCL PO Take by mouth. Patient not taking: No sig reported    [provider]  albuterol (PROVENTIL HFA;VENTOLIN HFA) 108 (90 BASE) MCG/ACT inhaler Inhale 2 puffs into the lungs every 4 (four) hours as needed for wheezing or shortness of breath. 11/24/13 10/06/14  10/08/14, NP  etonogestrel-ethinyl estradiol (NUVARING) 0.12-0.015 MG/24HR vaginal ring Place 1 each vaginally every 21 ( twenty-one) days. Insert one (1) ring vaginally and leave in place for three (3) weeks, then change. Patient not taking: Reported on 07/03/2015 04/02/15 09/02/15  09/04/15 A, CNM  ipratropium (ATROVENT) 0.06 % nasal spray Place 2 sprays into both nostrils 4 (four) times daily. 11/18/13 10/06/14  10/08/14, MD  pantoprazole (PROTONIX) 20 MG tablet  Take 1 tablet (20 mg total) by mouth 2 (two) times daily. 07/02/13 10/06/14  10/08/14, PA-C    Allergies    Morphine and related and Codeine  Review of Systems   Review of Systems  Constitutional: Negative for chills and fever.  HENT: Negative.   Respiratory: Negative for cough and shortness of breath.   Cardiovascular: Negative for chest pain.  Gastrointestinal: Positive for abdominal pain and nausea. Negative for constipation, diarrhea and vomiting.  Genitourinary: Negative for dysuria, flank pain and frequency.  Musculoskeletal: Negative for arthralgias and myalgias.  Skin: Negative for color change and rash.  Neurological: Negative for dizziness, syncope and light-headedness.  All other systems reviewed and are negative.   Physical Exam Updated Vital Signs BP 127/69 (BP Location: Right Arm)   Pulse 85   Temp 98.9 F (37.2 C) (Oral)   Resp 16   LMP 11/20/2020   SpO2 100%   Physical Exam Vitals and nursing note reviewed.  Constitutional:      General: She is not in acute distress.    Appearance: She is well-developed. She is not ill-appearing or diaphoretic.     Comments: Patient appears uncomfortable, but is in no acute distress  HENT:     Head: Normocephalic and atraumatic.     Mouth/Throat:     Mouth: Mucous membranes are moist.     Pharynx: Oropharynx is clear.  Eyes:     General:        Right eye: No discharge.        Left eye: No discharge.     Pupils: Pupils are equal, round, and reactive to light.  Cardiovascular:     Rate and Rhythm: Normal rate and regular rhythm.     Heart sounds: Normal heart sounds.  Pulmonary:     Effort: Pulmonary effort is normal. No respiratory distress.     Breath sounds: Normal breath sounds. No wheezing or rales.     Comments: Respirations equal and unlabored, patient able to speak in full sentences, lungs clear to auscultation bilaterally  Abdominal:     General: Bowel sounds are normal. There is no distension.      Palpations: Abdomen is soft. There is no mass.     Tenderness: There is abdominal tenderness in the right lower quadrant and epigastric area. There is no guarding.     Comments: Abdomen soft, nondistended, bowel sounds present throughout, there is some epigastric tenderness in the right lower quadrant but without guarding or peritoneal signs, no CVA tenderness  Musculoskeletal:        General: No deformity.     Cervical back: Neck supple.  Skin:    General: Skin is warm and dry.     Capillary Refill: Capillary refill takes less than 2 seconds.  Neurological:     Mental Status: She is alert.     Coordination: Coordination normal.     Comments: Speech is clear, able to follow commands Moves extremities without ataxia, coordination intact  Psychiatric:        Mood and Affect: Mood normal.        Behavior: Behavior normal.     ED Results / Procedures / Treatments   Labs (all labs ordered are listed, but only abnormal results are displayed) Labs Reviewed  COMPREHENSIVE METABOLIC PANEL - Abnormal; Notable for the following components:      Result Value   Potassium 3.3 (*)    All other components within normal limits  URINALYSIS, ROUTINE W REFLEX MICROSCOPIC - Abnormal; Notable for the following components:   Hgb urine dipstick SMALL (*)    Ketones, ur 20 (*)    All other components within normal limits  CBC WITH DIFFERENTIAL/PLATELET - Abnormal; Notable for the following components:   Neutro Abs 9.0 (*)    All other components within normal limits  LIPASE, BLOOD  HCG, SERUM, QUALITATIVE    EKG None  Radiology CT Abdomen Pelvis W Contrast  Result Date: 11/24/2020 CLINICAL DATA:  Right lower quadrant pain. EXAM: CT ABDOMEN AND PELVIS WITH CONTRAST TECHNIQUE: Multidetector CT imaging of the abdomen and pelvis was performed using  the standard protocol following bolus administration of intravenous contrast. CONTRAST:  100mL OMNIPAQUE IOHEXOL 300 MG/ML  SOLN COMPARISON:  July 21, 2015 FINDINGS: Lower chest: No acute abnormality. Hepatobiliary: No focal liver abnormality is seen. Status post cholecystectomy. No biliary dilatation. Pancreas: Unremarkable. No pancreatic ductal dilatation or surrounding inflammatory changes. Spleen: Normal in size without focal abnormality. Adrenals/Urinary Tract: Adrenal glands are unremarkable. Kidneys are normal, without obstructing renal calculi, focal lesion, or hydronephrosis. A cluster of 3 mm nonobstructing renal stones are seen within the lower pole of the left kidney. The urinary bladder is contracted and subsequently limited in evaluation. Stomach/Bowel: Stomach is within normal limits. Appendix appears normal. No evidence of bowel wall thickening, distention, or inflammatory changes. Vascular/Lymphatic: No significant vascular findings are present. No enlarged abdominal or pelvic lymph nodes. Reproductive: Uterus and bilateral adnexa are unremarkable. Other: No abdominal wall hernia or abnormality. No abdominopelvic ascites. Musculoskeletal: Moderate to marked severity degenerative changes seen at the level of L3-L4. IMPRESSION: 1. Status post cholecystectomy. 2. 3 mm nonobstructing renal stones within the left kidney. 3. Moderate to marked severity degenerative changes at the level of L3-L4. Electronically Signed   By: Aram Candelahaddeus  Houston M.D.   On: 11/24/2020 20:24    Procedures Procedures   Medications Ordered in ED Medications  ondansetron (ZOFRAN) injection 4 mg (4 mg Intravenous Given 11/24/20 1836)  sodium chloride 0.9 % bolus 1,000 mL (1,000 mLs Intravenous New Bag/Given 11/24/20 1835)  HYDROmorphone (DILAUDID) injection 0.5 mg (0.5 mg Intravenous Given 11/24/20 1837)  HYDROmorphone (DILAUDID) injection 0.5 mg (0.5 mg Intravenous Given 11/24/20 1908)  iohexol (OMNIPAQUE) 300 MG/ML solution 100 mL (100 mLs Intravenous Contrast Given 11/24/20 1956)  alum & mag hydroxide-simeth (MAALOX/MYLANTA) 200-200-20 MG/5ML suspension 30 mL (30 mLs  Oral Given 11/24/20 2052)    And  lidocaine (XYLOCAINE) 2 % viscous mouth solution 15 mL (15 mLs Oral Given 11/24/20 2052)  dicyclomine (BENTYL) capsule 20 mg (20 mg Oral Given 11/24/20 2051)    ED Course  I have reviewed the triage vital signs and the nursing notes.  Pertinent labs & imaging results that were available during my care of the patient were reviewed by me and considered in my medical decision making (see chart for details).    MDM Rules/Calculators/A&P                         Patient presents to the ED with complaints of abdominal pain. Patient nontoxic appearing, in no apparent distress, vitals WNL. On exam patient tender to palpation in the epigastric region and right lower quadrant, no peritoneal signs. Will evaluate with labs and CT abdomen pelvis. Analgesics, anti-emetics, and fluids administered.   Additional history obtained:  Additional history obtained from chart review & nursing note review.   Lab Tests:  I Ordered, reviewed, and interpreted labs, which included:  CBC: No leukocytosis, normal hemoglobin CMP: Potassium 3.3 but no other electrolyte derangements, normal renal and liver function Lipase: WNL UA: No hematuria or signs of infection Preg test: Negative  Imaging Studies ordered:  I ordered imaging studies which included CT abdomen pelvis, I independently reviewed, formal radiology impression shows:  Status post cholecystectomy, no evidence of appendicitis or other acute findings within the abdomen.  There is some nonobstructing renal stones in the left kidney.  Degenerative changes noted in the lumbar spine.  No evidence of adnexal masses.  No other abnormalities to explain patient's pain.  ED Course:   RE-EVAL: Patient is now  feeling much better, she reports now she primarily just feels indigestion, with right lower quadrant pain has improved significantly, she wonders if this may be the beginning of gastroenteritis especially with her recent  travel  On repeat abdominal exam patient remains without peritoneal signs, low suspicion for cholecystitis, pancreatitis, diverticulitis, appendicitis, bowel obstruction/perforation, PID, ectopic pregnancy, or other acute surgical process. Patient tolerating PO in the emergency department. Will discharge home with supportive measures. I discussed results, treatment plan, need for PCP follow-up, and return precautions with the patient. Provided opportunity for questions, patient confirmed understanding and is in agreement with plan.    Portions of this note were generated with Scientist, clinical (histocompatibility and immunogenetics). Dictation errors may occur despite best attempts at proofreading.  Final Clinical Impression(s) / ED Diagnoses Final diagnoses:  Epigastric pain  Right lower quadrant abdominal pain  Nausea    Rx / DC Orders ED Discharge Orders         Ordered    ondansetron (ZOFRAN ODT) 4 MG disintegrating tablet        11/24/20 2106    dicyclomine (BENTYL) 20 MG tablet  2 times daily        11/24/20 2106           Legrand Rams 11/24/20 2115    Terald Sleeper, MD 11/25/20 [phone]

## 2020-11-24 NOTE — ED Triage Notes (Signed)
Pain to epigastric area, RLQ and nausea that began today.

## 2020-11-28 ENCOUNTER — Other Ambulatory Visit: Payer: Self-pay

## 2020-11-28 ENCOUNTER — Ambulatory Visit (HOSPITAL_COMMUNITY)
Admission: EM | Admit: 2020-11-28 | Discharge: 2020-11-28 | Disposition: A | Discharge status: Home / Self Care | Payer: Self-pay | Attending: Physician Assistant | Admitting: Physician Assistant

## 2020-11-28 ENCOUNTER — Encounter (HOSPITAL_COMMUNITY): Payer: Self-pay

## 2020-11-28 DIAGNOSIS — M5441 Lumbago with sciatica, right side: Secondary | ICD-10-CM

## 2020-11-28 MED ORDER — GABAPENTIN 100 MG PO CAPS: 100.0000 mg | ORAL_CAPSULE | Freq: Three times a day (TID) | ORAL | 0 refills | Status: AC

## 2020-11-28 MED ORDER — KETOROLAC TROMETHAMINE 30 MG/ML IJ SOLN
INTRAMUSCULAR | Status: AC
  Filled 2020-11-28: qty 1

## 2020-11-28 MED ORDER — TIZANIDINE HCL 4 MG PO TABS: 4.0000 mg | ORAL_TABLET | Freq: Four times a day (QID) | ORAL | 0 refills | Status: DC | PRN

## 2020-11-28 MED ORDER — KETOROLAC TROMETHAMINE 30 MG/ML IJ SOLN
30.0000 mg | Freq: Once | INTRAMUSCULAR | Status: AC
  Administered 2020-11-28: 30 mg via INTRAMUSCULAR

## 2020-11-28 MED ORDER — GABAPENTIN 100 MG PO CAPS: 100.0000 mg | ORAL_CAPSULE | Freq: Three times a day (TID) | ORAL | 0 refills | Status: DC

## 2020-11-28 MED ORDER — PREDNISONE 10 MG (21) PO TBPK: ORAL_TABLET | Freq: Every day | ORAL | 0 refills | Status: DC

## 2020-11-28 NOTE — ED Triage Notes (Signed)
Pt presents with lower back pain, radiates to the hip and right thigh x 3 days. States she had a herniated disk L3-L4. Tylenol 1000 mg, ibuprofen 800 mg gives no relief. Waist trainer gives some relief.

## 2020-11-28 NOTE — Discharge Instructions (Addendum)
Take medications as prescribed Follow up with orthopedics

## 2020-11-28 NOTE — ED Provider Notes (Signed)
[facility]    CSN: [id number] Arrival date & time: 11/28/20  1727      History   Chief Complaint Chief Complaint  Patient presents with  . Back Pain    HPI Ana Pace is a 40 y.o. female.   Pt complains of lower back pain with radiation to lateral thigh to the knee that started 3 days ago after flying.  She has a planned flight tomorrow.  H/o lower back problems, previous ruptured disc with subsequent microdiscectomy.  She is taking antiinflammatory with no improvement. Denies numbness, tingling, saddle anesthesia.  Reports tizanidine and steroids have helped in the past.  She has also taken gabapentin in the past with some improvement. She has been wearing a compressive wrap with improvement.      Past Medical History:  Diagnosis Date  . Abnormal Pap smear   . Anxiety   . Chronic back pain   . Endometrial hyperplasia without atypia, simple   . Endometriosis   . GERD (gastroesophageal reflux disease)    tums prn  . Herniated nucleus pulposus of lumbosacral region   . Kidney stone   . Ovarian cyst     Patient Active Problem List   Diagnosis Date Noted  . HNP (herniated nucleus pulposus), lumbar 10/07/2016  . Herniated nucleus pulposus 10/07/2016    Past Surgical History:  Procedure Laterality Date  . abdominalplasty  11/18/2016  . BREAST ENHANCEMENT SURGERY    . CESAREAN SECTION     x3  . CESAREAN SECTION  04/02/2012   Procedure: CESAREAN SECTION;  Surgeon: Antionette Char, MD;  Location: [facility];  Service: Obstetrics;  Laterality: N/A;  Repeat cesarean section with delivery of baby girl at 62.  . CHOLECYSTECTOMY    . DIAGNOSTIC LAPAROSCOPY     endometriosis  . LITHOTRIPSY    . LUMBAR LAMINECTOMY/DECOMPRESSION MICRODISCECTOMY Right 10/07/2016   Procedure: Right Lumbar three-four Microdiscectomy;  Surgeon: Loura Halt Ditty, MD;  Location: [facility] OR;  Service: Neurosurgery;  Laterality: Right;    OB History    Gravida  3   Para  3    Term  1   Preterm      AB      Living  3     SAB      IAB      Ectopic      Multiple      Live Births  1            Home Medications    Prior to Admission medications   Medication Sig Start Date End Date Taking? Authorizing Provider  gabapentin (NEURONTIN) 100 MG capsule Take 1 capsule (100 mg total) by mouth 3 (three) times daily. 11/28/20  Yes Carnetta Losada, PA-C  predniSONE (STERAPRED UNI-PAK 21 TAB) 10 MG (21) TBPK tablet Take by mouth daily. Take 6 tabs by mouth daily  for 2 days, then 5 tabs for 2 days, then 4 tabs for 2 days, then 3 tabs for 2 days, 2 tabs for 2 days, then 1 tab by mouth daily for 2 days 11/28/20  Yes Zelie Asbill, Shanda Bumps, PA-C  acetaminophen (TYLENOL) 500 MG tablet Take 1,000 mg by mouth daily as needed for mild pain or headache.    [provider]  DICLOFENAC PO Take by mouth. Patient not taking: No sig reported    [provider]  dicyclomine (BENTYL) 20 MG tablet Take 1 tablet (20 mg total) by mouth 2 (two) times daily. 11/24/20   Dartha Lodge,  PA-C  famotidine (PEPCID) 20 MG tablet Take 20 mg by mouth daily.    [provider]  ferrous sulfate 325 (65 FE) MG EC tablet Take 1 tablet by mouth daily with breakfast. Patient not taking: No sig reported    [provider]  Homeopathic Products (ARNICA-HEEL PO) Take 1 tablet by mouth daily. Patient not taking: No sig reported    [provider]  HYDROcodone-acetaminophen (NORCO/VICODIN) 5-325 MG tablet Take 1 tablet by mouth every 6 (six) hours as needed for moderate pain. Patient not taking: Reported on 11/24/2020    [provider]  ibuprofen (ADVIL,MOTRIN) 200 MG tablet Take 800 mg by mouth every 6 (six) hours as needed for mild pain.    [provider]  metoCLOPramide (REGLAN) 10 MG tablet Take 1 tablet (10 mg total) by mouth every 6 (six) hours as needed for nausea (or headache). Patient not taking: No sig reported 12/09/16   Samuel Jester, DO  Multiple Vitamins-Minerals (ZINC PO) Take 1 tablet by mouth daily. Patient not taking: Reported on 11/24/2020    [provider]  ondansetron (ZOFRAN ODT) 4 MG disintegrating tablet 4mg  ODT q4 hours prn nausea/vomit 11/24/20   01/24/21, PA-C  ondansetron (ZOFRAN) 4 MG tablet Take 1 tablet (4 mg total) by mouth every 8 (eight) hours as needed for nausea or vomiting. Patient not taking: No sig reported 04/25/17   06/25/17, PA-C  oxyCODONE-acetaminophen (PERCOCET) 5-325 MG tablet Take 1-2 tablets by mouth every 4 (four) hours as needed. Patient not taking: No sig reported 03/28/17   03/30/17, MD  phentermine (ADIPEX-P) 37.5 MG tablet Take 37.5 mg by mouth daily. Patient not taking: No sig reported 12/08/16   [provider]  tiZANidine (ZANAFLEX) 4 MG tablet Take 1 tablet (4 mg total) by mouth every 6 (six) hours as needed for muscle spasms. 11/28/20   11/30/20, PA-C  albuterol (PROVENTIL HFA;VENTOLIN HFA) 108 (90 BASE) MCG/ACT inhaler Inhale 2 puffs into the lungs every 4 (four) hours as needed for wheezing or shortness of breath. 11/24/13 10/06/14  10/08/14, NP  etonogestrel-ethinyl estradiol (NUVARING) 0.12-0.015 MG/24HR vaginal ring Place 1 each vaginally every 21 ( twenty-one) days. Insert one (1) ring vaginally and leave in place for three (3) weeks, then change. Patient not taking: Reported on 07/03/2015 04/02/15 09/02/15  09/04/15 A, CNM  ipratropium (ATROVENT) 0.06 % nasal spray Place 2 sprays into both nostrils 4 (four) times daily. 11/18/13 10/06/14  10/08/14, MD  pantoprazole (PROTONIX) 20 MG tablet Take 1 tablet (20 mg total) by mouth 2 (two) times daily. 07/02/13 10/06/14  10/08/14, PA-C    Family History Family History  Problem Relation Age of Onset  . Hypertension Mother     Social History Social History   Tobacco Use  . Smoking status: Former Smoker    Years: 3.00    Types: Cigarettes    Quit date: 09/05/2007     Years since quitting: 13.2  . Smokeless tobacco: Never Used  Substance Use Topics  . Alcohol use: Yes    Comment: occasional  . Drug use: No     Allergies   Morphine and related and Codeine   Review of Systems Review of Systems  Constitutional: Negative for chills and fever.  HENT: Negative for ear pain and sore throat.   Eyes: Negative for pain and visual disturbance.  Respiratory: Negative for cough and shortness of breath.   Cardiovascular: Negative for chest pain  and palpitations.  Gastrointestinal: Negative for abdominal pain and vomiting.  Genitourinary: Negative for dysuria and hematuria.  Musculoskeletal: Positive for back pain. Negative for arthralgias.  Skin: Negative for color change and rash.  Neurological: Negative for seizures, syncope and numbness.  All other systems reviewed and are negative.    Physical Exam Triage Vital Signs ED Triage Vitals  Enc Vitals Group     BP 11/28/20 1807 117/66     Pulse Rate 11/28/20 1807 78     Resp 11/28/20 1807 18     Temp 11/28/20 1807 98.5 F (36.9 C)     Temp Source 11/28/20 1807 Oral     SpO2 11/28/20 1807 100 %     Weight --      Height --      Head Circumference --      Peak Flow --      Pain Score 11/28/20 1804 8     Pain Loc --      Pain Edu? --      Excl. in GC? --    No data found.  Updated Vital Signs BP 117/66 (BP Location: Right Arm)   Pulse 78   Temp 98.5 F (36.9 C) (Oral)   Resp 18   LMP 11/20/2020 (Exact Date)   SpO2 100%   Visual Acuity Right Eye Distance:   Left Eye Distance:   Bilateral Distance:    Right Eye Near:   Left Eye Near:    Bilateral Near:     Physical Exam Vitals and nursing note reviewed.  Constitutional:      General: She is not in acute distress.    Appearance: She is well-developed.  HENT:     Head: Normocephalic and atraumatic.  Eyes:     Conjunctiva/sclera: Conjunctivae normal.  Cardiovascular:     Rate and Rhythm: Normal rate and regular rhythm.      Heart sounds: No murmur heard.   Pulmonary:     Effort: Pulmonary effort is normal. No respiratory distress.     Breath sounds: Normal breath sounds.  Abdominal:     Palpations: Abdomen is soft.     Tenderness: There is no abdominal tenderness.  Musculoskeletal:     Cervical back: Neck supple.     Lumbar back: Tenderness (ttp to right lumbar paraspinal musculature) present. Positive right straight leg raise test. Negative left straight leg raise test.  Skin:    General: Skin is warm and dry.  Neurological:     Mental Status: She is alert.      UC Treatments / Results  Labs (all labs ordered are listed, but only abnormal results are displayed) Labs Reviewed - No data to display  EKG   Radiology No results found.  Procedures Procedures (including critical care time)  Medications Ordered in UC Medications  ketorolac (TORADOL) 30 MG/ML injection 30 mg (has no administration in time range)    Initial Impression / Assessment and Plan / UC Course  I have reviewed the triage vital signs and the nursing notes.  Pertinent labs & imaging results that were available during my care of the patient were reviewed by me and considered in my medical decision making (see chart for details).     Low back pain spasm with radiculopathy.  Toradol given in clinic today.  She will begin steroids tomorrow.  Tizanidine and gabapentin prescribed as well.  Pt will follow up with orthopedics.  Return precautions discussed.  Final Clinical Impressions(s) / UC Diagnoses  Final diagnoses:  Acute right-sided low back pain with right-sided sciatica     Discharge Instructions     Take medications as prescribed Follow up with orthopedics    ED Prescriptions    Medication Sig Dispense Auth. Provider   predniSONE (STERAPRED UNI-PAK 21 TAB) 10 MG (21) TBPK tablet Take by mouth daily. Take 6 tabs by mouth daily  for 2 days, then 5 tabs for 2 days, then 4 tabs for 2 days, then 3 tabs for 2  days, 2 tabs for 2 days, then 1 tab by mouth daily for 2 days 42 tablet Leialoha Hanna, PA-C   tiZANidine (ZANAFLEX) 4 MG tablet  (Status: Discontinued) Take 1 tablet (4 mg total) by mouth every 6 (six) hours as needed for muscle spasms. 30 tablet Damaris Geers, PA-C   gabapentin (NEURONTIN) 100 MG capsule Take 1 capsule (100 mg total) by mouth 3 (three) times daily. 21 capsule Tee Richeson, PA-C   tiZANidine (ZANAFLEX) 4 MG tablet Take 1 tablet (4 mg total) by mouth every 6 (six) hours as needed for muscle spasms. 30 tablet Jodell Cipro, PA-C     PDMP not reviewed this encounter.   Jodell Cipro, PA-C 11/28/20 1841

## 2021-06-04 ENCOUNTER — Emergency Department (HOSPITAL_COMMUNITY)
Admission: EM | Admit: 2021-06-04 | Discharge: 2021-06-04 | Disposition: A | Discharge status: Home / Self Care | Payer: No Typology Code available for payment source | Attending: Emergency Medicine | Admitting: Emergency Medicine

## 2021-06-04 ENCOUNTER — Encounter (HOSPITAL_COMMUNITY): Payer: Self-pay | Admitting: *Deleted

## 2021-06-04 ENCOUNTER — Emergency Department (HOSPITAL_COMMUNITY): Payer: No Typology Code available for payment source

## 2021-06-04 DIAGNOSIS — R109 Unspecified abdominal pain: Secondary | ICD-10-CM | POA: Diagnosis not present

## 2021-06-04 DIAGNOSIS — N939 Abnormal uterine and vaginal bleeding, unspecified: Secondary | ICD-10-CM | POA: Insufficient documentation

## 2021-06-04 LAB — URINALYSIS, ROUTINE W REFLEX MICROSCOPIC
Bacteria, UA: NONE SEEN
Bilirubin Urine: NEGATIVE
Glucose, UA: NEGATIVE mg/dL
Ketones, ur: 20 mg/dL — AB
Leukocytes,Ua: NEGATIVE
Nitrite: NEGATIVE
Protein, ur: NEGATIVE mg/dL
RBC / HPF: >50 RBC/hpf — ABNORMAL HIGH (ref 0–5)
Specific Gravity, Urine: 1.02 (ref 1.005–1.030)
pH: 5 (ref 5.0–8.0)

## 2021-06-04 LAB — CBC
HCT: 35.8 % — ABNORMAL LOW (ref 36.0–46.0)
Hemoglobin: 11.8 g/dL — ABNORMAL LOW (ref 12.0–15.0)
MCH: 30.4 pg (ref 26.0–34.0)
MCHC: 33 g/dL (ref 30.0–36.0)
MCV: 92.3 fL (ref 80.0–100.0)
Platelets: 213 10*3/uL (ref 150–400)
RBC: 3.88 MIL/uL (ref 3.87–5.11)
RDW: 12.2 % (ref 11.5–15.5)
WBC: 9.7 10*3/uL (ref 4.0–10.5)
nRBC: 0 % (ref 0.0–0.2)

## 2021-06-04 LAB — COMPREHENSIVE METABOLIC PANEL
ALT: 15 U/L (ref 0–44)
AST: 16 U/L (ref 15–41)
Albumin: 3.9 g/dL (ref 3.5–5.0)
Alkaline Phosphatase: 71 U/L (ref 38–126)
Anion gap: 6 (ref 5–15)
BUN: 12 mg/dL (ref 6–20)
CO2: 26 mmol/L (ref 22–32)
Calcium: 8.7 mg/dL — ABNORMAL LOW (ref 8.9–10.3)
Chloride: 105 mmol/L (ref 98–111)
Creatinine, Ser: 0.83 mg/dL (ref 0.44–1.00)
GFR, Estimated: >60 mL/min (ref >60)
Glucose, Bld: 76 mg/dL (ref 70–99)
Potassium: 4.1 mmol/L (ref 3.5–5.1)
Sodium: 137 mmol/L (ref 135–145)
Total Bilirubin: 0.6 mg/dL (ref 0.3–1.2)
Total Protein: 6.8 g/dL (ref 6.5–8.1)

## 2021-06-04 LAB — PREGNANCY, URINE: Preg Test, Ur: NEGATIVE

## 2021-06-04 MED ORDER — MEGESTROL ACETATE 40 MG PO TABS: 40.0000 mg | ORAL_TABLET | Freq: Every day | ORAL | 2 refills | Status: AC

## 2021-06-04 MED ORDER — MORPHINE SULFATE (PF) 4 MG/ML IV SOLN
4.0000 mg | Freq: Once | INTRAVENOUS | Status: AC
Start: 2021-06-04 — End: 2021-06-04
  Administered 2021-06-04: 4 mg via INTRAVENOUS
  Filled 2021-06-04: qty 1

## 2021-06-04 MED ORDER — MORPHINE SULFATE (PF) 4 MG/ML IV SOLN
4.0000 mg | Freq: Once | INTRAVENOUS | Status: AC
  Administered 2021-06-04: 4 mg via INTRAVENOUS
  Filled 2021-06-04: qty 1

## 2021-06-04 MED ORDER — ONDANSETRON 4 MG PO TBDP: 4.0000 mg | ORAL_TABLET | Freq: Three times a day (TID) | ORAL | 0 refills | Status: DC | PRN

## 2021-06-04 MED ORDER — ONDANSETRON HCL 4 MG/2ML IJ SOLN
4.0000 mg | Freq: Once | INTRAMUSCULAR | Status: AC
Start: 2021-06-04 — End: 2021-06-04
  Administered 2021-06-04: 4 mg via INTRAVENOUS
  Filled 2021-06-04: qty 2

## 2021-06-04 MED ORDER — OXYCODONE-ACETAMINOPHEN 5-325 MG PO TABS: 1.0000 | ORAL_TABLET | Freq: Four times a day (QID) | ORAL | 0 refills | Status: DC | PRN

## 2021-06-04 NOTE — ED Notes (Signed)
Pt returned from US

## 2021-06-04 NOTE — ED Notes (Addendum)
Wet prep collected by PA and nurse tech Dalia was rejected due to specimen was dry, told to recollect if needed. PA made aware.

## 2021-06-04 NOTE — Discharge Instructions (Addendum)
Take the Megace 40 mg 3 times daily for 5 days.  After that take it twice daily for 5 days.  After that take 1 pill for the remaining days.   Follow-up with your OBG next week as scheduled. Return to the ED if things change or worsen. Take the pain medicine as needed for breakthrough pain, take Zofran to help prevent nausea and vomiting.

## 2021-06-04 NOTE — ED Triage Notes (Signed)
Vaginal bleeding with abdominal pain

## 2021-06-04 NOTE — ED Notes (Signed)
Pt in US

## 2021-06-04 NOTE — ED Provider Notes (Signed)
[facility] EMERGENCY DEPARTMENT Provider Note   CSN: [id number] Arrival date & time: 06/04/21  1125     History Chief Complaint  Patient presents with   Vaginal Bleeding    Ana Pace is a 40 y.o. female.  HPI  Patient with history of endometriosis, status post 2 C-sections, status postcholecystectomy, right ovarian cyst presents with vaginal bleeding.  Vaginal bleeding started acutely today, she was having vaginal bleeding weeks ago, started on a oral birth control medicine.  Dual birth control medicine stopped the bleeding, she finished on Wednesday.  Reports that she has been having right lower quadrant abdominal pain off and on for weeks, today it is associated with abdominal distention.  There is no nausea or vomiting, no diarrhea or constipation.  Patient is not on any blood thinners.  Past Medical History:  Diagnosis Date   Abnormal Pap smear    Anxiety    Chronic back pain    Endometrial hyperplasia without atypia, simple    Endometriosis    GERD (gastroesophageal reflux disease)    tums prn   Herniated nucleus pulposus of lumbosacral region    Kidney stone    Ovarian cyst     Patient Active Problem List   Diagnosis Date Noted   HNP (herniated nucleus pulposus), lumbar 10/07/2016   Herniated nucleus pulposus 10/07/2016    Past Surgical History:  Procedure Laterality Date   abdominalplasty  11/18/2016   BREAST ENHANCEMENT SURGERY     CESAREAN SECTION     x3   CESAREAN SECTION  04/02/2012   Procedure: CESAREAN SECTION;  Surgeon: Antionette Char, MD;  Location: [facility];  Service: Obstetrics;  Laterality: N/A;  Repeat cesarean section with delivery of baby girl at 89.   CHOLECYSTECTOMY     DIAGNOSTIC LAPAROSCOPY     endometriosis   LITHOTRIPSY     LUMBAR LAMINECTOMY/DECOMPRESSION MICRODISCECTOMY Right 10/07/2016   Procedure: Right Lumbar three-four Microdiscectomy;  Surgeon: Loura Halt Ditty, MD;  Location[facility] OR;  Service: Neurosurgery;   Laterality: Right;     OB History     Gravida  3   Para  3   Term  1   Preterm      AB      Living  3      SAB      IAB      Ectopic      Multiple      Live Births  1           Family History  Problem Relation Age of Onset   Hypertension Mother     Social History   Tobacco Use   Smoking status: Former    Years: 3.00    Types: Cigarettes    Quit date: 09/05/2007    Years since quitting: 13.7   Smokeless tobacco: Never  Substance Use Topics   Alcohol use: Yes    Comment: occasional   Drug use: No    Home Medications Prior to Admission medications   Medication Sig Start Date End Date Taking? Authorizing Provider  acetaminophen (TYLENOL) 500 MG tablet Take 1,000 mg by mouth daily as needed for mild pain or headache.    [provider]  DICLOFENAC PO Take by mouth. Patient not taking: No sig reported    [provider]  dicyclomine (BENTYL) 20 MG tablet Take 1 tablet (20 mg total) by mouth 2 (two) times daily. 11/24/20   Dartha Lodge, PA-C  famotidine (PEPCID) 20 MG tablet Take  20 mg by mouth daily.    [provider]  ferrous sulfate 325 (65 FE) MG EC tablet Take 1 tablet by mouth daily with breakfast. Patient not taking: No sig reported    [provider]  gabapentin (NEURONTIN) 100 MG capsule Take 1 capsule (100 mg total) by mouth 3 (three) times daily. 11/28/20   Ward, Tylene Fantasia, PA-C  Homeopathic Products (ARNICA-HEEL PO) Take 1 tablet by mouth daily. Patient not taking: No sig reported    [provider]  HYDROcodone-acetaminophen (NORCO/VICODIN) 5-325 MG tablet Take 1 tablet by mouth every 6 (six) hours as needed for moderate pain. Patient not taking: Reported on 11/24/2020    [provider]  ibuprofen (ADVIL,MOTRIN) 200 MG tablet Take 800 mg by mouth every 6 (six) hours as needed for mild pain.    [provider]  metoCLOPramide (REGLAN) 10 MG tablet Take 1 tablet (10 mg total) by  mouth every 6 (six) hours as needed for nausea (or headache). Patient not taking: No sig reported 12/09/16   Samuel Jester, DO  Multiple Vitamins-Minerals (ZINC PO) Take 1 tablet by mouth daily. Patient not taking: Reported on 11/24/2020    [provider]  ondansetron (ZOFRAN ODT) 4 MG disintegrating tablet 4mg  ODT q4 hours prn nausea/vomit 11/24/20   01/24/21, PA-C  ondansetron (ZOFRAN) 4 MG tablet Take 1 tablet (4 mg total) by mouth every 8 (eight) hours as needed for nausea or vomiting. Patient not taking: No sig reported 04/25/17   06/25/17, PA-C  oxyCODONE-acetaminophen (PERCOCET) 5-325 MG tablet Take 1-2 tablets by mouth every 4 (four) hours as needed. Patient not taking: No sig reported 03/28/17   03/30/17, MD  phentermine (ADIPEX-P) 37.5 MG tablet Take 37.5 mg by mouth daily. Patient not taking: No sig reported 12/08/16   [provider]  predniSONE (STERAPRED UNI-PAK 21 TAB) 10 MG (21) TBPK tablet Take by mouth daily. Take 6 tabs by mouth daily  for 2 days, then 5 tabs for 2 days, then 4 tabs for 2 days, then 3 tabs for 2 days, 2 tabs for 2 days, then 1 tab by mouth daily for 2 days 11/28/20   Ward, 11/30/20 Z, PA-C  tiZANidine (ZANAFLEX) 4 MG tablet Take 1 tablet (4 mg total) by mouth every 6 (six) hours as needed for muscle spasms. 11/28/20   Ward, 11/30/20, PA-C  albuterol (PROVENTIL HFA;VENTOLIN HFA) 108 (90 BASE) MCG/ACT inhaler Inhale 2 puffs into the lungs every 4 (four) hours as needed for wheezing or shortness of breath. 11/24/13 10/06/14  10/08/14, NP  etonogestrel-ethinyl estradiol (NUVARING) 0.12-0.015 MG/24HR vaginal ring Place 1 each vaginally every 21 ( twenty-one) days. Insert one (1) ring vaginally and leave in place for three (3) weeks, then change. Patient not taking: Reported on 07/03/2015 04/02/15 09/02/15  09/04/15 A, CNM  ipratropium (ATROVENT) 0.06 % nasal spray Place 2 sprays into both nostrils 4 (four) times daily. 11/18/13 10/06/14   10/08/14, MD  pantoprazole (PROTONIX) 20 MG tablet Take 1 tablet (20 mg total) by mouth 2 (two) times daily. 07/02/13 10/06/14  10/08/14, PA-C    Allergies    Morphine and related and Codeine  Review of Systems   Review of Systems  Constitutional:  Negative for chills and fever.  HENT:  Negative for ear pain and sore throat.   Eyes:  Negative for pain and visual disturbance.  Respiratory:  Negative for cough and shortness of breath.   Cardiovascular:  Negative for chest pain and palpitations.  Gastrointestinal:  Positive for abdominal pain. Negative for vomiting.  Genitourinary:  Positive for pelvic pain, vaginal bleeding and vaginal pain. Negative for dysuria and hematuria.  Musculoskeletal:  Negative for arthralgias and back pain.  Skin:  Negative for color change and rash.  Neurological:  Negative for seizures and syncope.  All other systems reviewed and are negative.  Physical Exam Updated Vital Signs BP 119/78 (BP Location: Right Arm)   Pulse 73   Temp 98.6 F (37 C) (Oral)   Resp 17   SpO2 100%   Physical Exam Vitals and nursing note reviewed. Exam conducted with a chaperone present.  Constitutional:      Appearance: Normal appearance.  HENT:     Head: Normocephalic and atraumatic.  Eyes:     General: No scleral icterus.       Right eye: No discharge.        Left eye: No discharge.     Extraocular Movements: Extraocular movements intact.     Pupils: Pupils are equal, round, and reactive to light.  Cardiovascular:     Rate and Rhythm: Normal rate and regular rhythm.     Pulses: Normal pulses.     Heart sounds: Normal heart sounds. No murmur heard.   No friction rub. No gallop.  Pulmonary:     Effort: Pulmonary effort is normal. No respiratory distress.     Breath sounds: Normal breath sounds.  Abdominal:     General: Abdomen is flat. Bowel sounds are normal. There is no distension.     Palpations: Abdomen is soft.     Tenderness: There is  abdominal tenderness.     Comments: Right lower quadrant tenderness, no CVA tenderness.  Abdomen is soft, there is no rigidity or guarding  Genitourinary:    Comments: Some blood pooling in the vaginal wall, there is right adnexal tenderness.  Slight cervical wall motion tenderness, no opaque discharge. Skin:    General: Skin is warm and dry.     Coloration: Skin is not jaundiced.  Neurological:     Mental Status: She is alert. Mental status is at baseline.     Coordination: Coordination normal.    ED Results / Procedures / Treatments   Labs (all labs ordered are listed, but only abnormal results are displayed) Labs Reviewed  COMPREHENSIVE METABOLIC PANEL - Abnormal; Notable for the following components:      Result Value   Calcium 8.7 (*)    All other components within normal limits  CBC - Abnormal; Notable for the following components:   Hemoglobin 11.8 (*)    HCT 35.8 (*)    All other components within normal limits  URINALYSIS, ROUTINE W REFLEX MICROSCOPIC    EKG None  Radiology No results found.  Procedures Procedures   Medications Ordered in ED Medications - No data to display  ED Course  I have reviewed the triage vital signs and the nursing notes.  Pertinent labs & imaging results that were available during my care of the patient were reviewed by me and considered in my medical decision making (see chart for details).    MDM Rules/Calculators/A&P                           Patient presents with vaginal bleeding and right lower quadrant tenderness.  There is no rebound tenderness or guarding, suspect that the pain is more likely of related to the right  ovarian cyst or continued pelvic pain.  However, appendicitis is still a consideration.  Ultrasound ordered to evaluate for possible ovarian torsion.  Patient also has an known ovarian cyst on the right, could be having hemorrhagic cyst.  Patient also has history of endometriosis could be a continuation of that.   Patient is not pregnant, not an ectopic pregnancy.  Patient is mildly anemic at 11.8, but not acutely anemic or requiring transfusion.  No significant leukocytosis, electrolytes are within normal limits.  Patient pain is improved.  Serial abdominal exams have been relatively benign, there was some right adnexal tenderness as well cervical motion tenderness.  Did collect gonorrhea and chlamydia samples patient does not have a history of STDs.  She is in a monogamous relationship with a partner has been for over 3 years.  Also not having vaginal discharge so I doubt this is PID.  Ultrasound negative for any acute findings, patient does have right ovarian cyst.  She has follow-up with her OBG next Tuesday, we spoke with gynecologist on-call and advised trying patient on the megestrol and having them continue the plan to follow-up.  Return precautions given, patient discharged in stable position.  Final Clinical Impression(s) / ED Diagnoses Final diagnoses:  None    Rx / DC Orders ED Discharge Orders     None        Theron Arista, Cordelia Poche 06/04/21 1949    Gloris Manchester, MD 06/06/21 0021

## 2021-06-07 LAB — GC/CHLAMYDIA PROBE AMP (~~LOC~~) NOT AT ARMC
Chlamydia: NEGATIVE
Comment: NEGATIVE
Comment: NORMAL
Neisseria Gonorrhea: NEGATIVE

## 2021-06-10 ENCOUNTER — Encounter: Payer: PRIVATE HEALTH INSURANCE | Admitting: Obstetrics & Gynecology

## 2022-12-23 ENCOUNTER — Encounter (HOSPITAL_COMMUNITY): Payer: Self-pay

## 2022-12-23 ENCOUNTER — Ambulatory Visit (HOSPITAL_COMMUNITY)
Admission: EM | Admit: 2022-12-23 | Discharge: 2022-12-23 | Disposition: A | Discharge status: Home / Self Care | Payer: No Typology Code available for payment source | Attending: Family Medicine | Admitting: Family Medicine

## 2022-12-23 DIAGNOSIS — M62830 Muscle spasm of back: Secondary | ICD-10-CM

## 2022-12-23 MED ORDER — DIAZEPAM 5 MG PO TABS: 2.5000 mg | ORAL_TABLET | Freq: Two times a day (BID) | ORAL | 0 refills | Status: AC | PRN

## 2022-12-23 NOTE — Discharge Instructions (Signed)
Diazepam 5 mg--try taking 1/2 to 1 tablet every 12 hours as needed for muscle spasm.  This medicine can cause sedation and dizziness

## 2022-12-23 NOTE — ED Triage Notes (Signed)
Pt present lower back and right hip pain, symptoms started yesterday. Pt states the pain is tight and dull.

## 2022-12-23 NOTE — ED Provider Notes (Signed)
[facility]    CSN: [id number] Arrival date & time: 12/23/22  0102      History   Chief Complaint Chief Complaint  Patient presents with   Back Pain    HPI Ana Pace is a 42 y.o. female.    Back Pain  Here for low back pain and muscle spasm.  She had already been treated for some back trouble with gabapentin.  She also has been taking some methocarbamol.  She been prescribed Toradol tablets for after his surgery she had in the last month or so.  Then she worked several 12-hour shifts in a row, then drove from [place] here for her daughters college graduation.  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  Currently nothing is helping relieve her back pain and she is noting a lot of muscle spasms.  If she sneezes she has not a lot more pain 2.  Last menstrual cycle was April 21  No bowel or bladder incontinence.  No recent nausea or vomiting or diarrhea.  No dysuria or hematuria.  No trauma  Past Medical History:  Diagnosis Date   Abnormal Pap smear    Anxiety    Chronic back pain    Endometrial hyperplasia without atypia, simple    Endometriosis    GERD (gastroesophageal reflux disease)    tums prn   Herniated nucleus pulposus of lumbosacral region    Kidney stone    Ovarian cyst     Patient Active Problem List   Diagnosis Date Noted   HNP (herniated nucleus pulposus), lumbar 10/07/2016   Herniated nucleus pulposus 10/07/2016    Past Surgical History:  Procedure Laterality Date   abdominalplasty  11/18/2016   BREAST ENHANCEMENT SURGERY     CESAREAN SECTION     x3   CESAREAN SECTION  04/02/2012   Procedure: CESAREAN SECTION;  Surgeon: Antionette Char, MD;  Location: [facility];  Service: Obstetrics;  Laterality: N/A;  Repeat cesarean section with delivery of baby girl at 21.   CHOLECYSTECTOMY     DIAGNOSTIC LAPAROSCOPY     endometriosis   LITHOTRIPSY     LUMBAR LAMINECTOMY/DECOMPRESSION MICRODISCECTOMY Right  10/07/2016   Procedure: Right Lumbar three-four Microdiscectomy;  Surgeon: Loura Halt Ditty, MD;  Location: [facility];  Service: Neurosurgery;  Laterality: Right;    OB History     Gravida  3   Para  3   Term  1   Preterm      AB      Living  3      SAB      IAB      Ectopic      Multiple      Live Births  1            Home Medications    Prior to Admission medications   Medication Sig Start Date End Date Taking? Authorizing Provider  diazepam (VALIUM) 5 MG tablet Take 0.5-1 tablets (2.5-5 mg total) by mouth every 12 (twelve) hours as needed for muscle spasms. 12/23/22  Yes Zenia Resides, MD  acetaminophen (TYLENOL) 500 MG tablet Take 1,000 mg by mouth daily as needed for mild pain or headache.    [provider]  DICLOFENAC PO Take by mouth. Patient not taking: No sig reported    [provider]  dicyclomine (BENTYL) 20 MG tablet Take 1 tablet (20 mg total) by mouth 2 (two) times daily. Patient not taking: No sig reported  11/24/20   Dartha Lodge, PA-C  famotidine (PEPCID) 20 MG tablet Take 20 mg by mouth daily. Patient not taking: Reported on 06/04/2021    [provider]  ferrous sulfate 325 (65 FE) MG EC tablet Take 1 tablet by mouth daily with breakfast. Patient not taking: No sig reported    [provider]  gabapentin (NEURONTIN) 100 MG capsule Take 1 capsule (100 mg total) by mouth 3 (three) times daily. Patient not taking: No sig reported 11/28/20   Ward, Tylene Fantasia, PA-C  gabapentin (NEURONTIN) 300 MG capsule Take 300 mg by mouth 3 (three) times daily as needed.    [provider]  Homeopathic Products (ARNICA-HEEL PO) Take 1 tablet by mouth daily. Patient not taking: No sig reported    [provider]  megestrol (MEGACE) 40 MG tablet Take 1 tablet (40 mg total) by mouth daily. 06/04/21   Theron Arista, PA-C  methocarbamol (ROBAXIN) 750 MG tablet Take 750 mg by mouth every 8 (eight) hours as needed  for muscle spasms.    [provider]  metoCLOPramide (REGLAN) 10 MG tablet Take 1 tablet (10 mg total) by mouth every 6 (six) hours as needed for nausea (or headache). Patient not taking: No sig reported 12/09/16   Samuel Jester, DO  Multiple Vitamins-Minerals (ZINC PO) Take 1 tablet by mouth daily. Patient not taking: No sig reported    [provider]  albuterol (PROVENTIL HFA;VENTOLIN HFA) 108 (90 BASE) MCG/ACT inhaler Inhale 2 puffs into the lungs every 4 (four) hours as needed for wheezing or shortness of breath. 11/24/13 10/06/14  Hayden Rasmussen, NP  etonogestrel-ethinyl estradiol (NUVARING) 0.12-0.015 MG/24HR vaginal ring Place 1 each vaginally every 21 ( twenty-one) days. Insert one (1) ring vaginally and leave in place for three (3) weeks, then change. Patient not taking: Reported on 07/03/2015 04/02/15 09/02/15  Orvilla Cornwall A, CNM  ipratropium (ATROVENT) 0.06 % nasal spray Place 2 sprays into both nostrils 4 (four) times daily. 11/18/13 10/06/14  Linna Hoff, MD  pantoprazole (PROTONIX) 20 MG tablet Take 1 tablet (20 mg total) by mouth 2 (two) times daily. 07/02/13 10/06/14  Junious Silk, PA-C    Family History Family History  Problem Relation Age of Onset   Hypertension Mother     Social History Social History   Tobacco Use   Smoking status: Former    Years: 3    Types: Cigarettes    Quit date: 09/05/2007    Years since quitting: 15.3   Smokeless tobacco: Never  Substance Use Topics   Alcohol use: Yes    Comment: occasional   Drug use: No     Allergies   Morphine and related and Codeine   Review of Systems Review of Systems  Musculoskeletal:  Positive for back pain.     Physical Exam Triage Vital Signs ED Triage Vitals  Enc Vitals Group     BP 12/23/22 0913 118/80     Pulse Rate 12/23/22 0913 99     Resp 12/23/22 0913 18     Temp 12/23/22 0913 98.3 F (36.8 C)     Temp Source 12/23/22 0913 Oral     SpO2 12/23/22 0913 97 %      Weight --      Height --      Head Circumference --      Peak Flow --      Pain Score 12/23/22 0912 7     Pain Loc --  Pain Edu? --      Excl. in GC? --    No data found.  Updated Vital Signs BP 118/80 (BP Location: Left Arm)   Pulse 99   Temp 98.3 F (36.8 C) (Oral)   Resp 18   LMP 12/04/2022   SpO2 97%   Visual Acuity Right Eye Distance:   Left Eye Distance:   Bilateral Distance:    Right Eye Near:   Left Eye Near:    Bilateral Near:     Physical Exam Vitals reviewed.  Constitutional:      General: She is not in acute distress.    Appearance: She is not ill-appearing, toxic-appearing or diaphoretic.  Cardiovascular:     Rate and Rhythm: Normal rate and regular rhythm.  Pulmonary:     Effort: Pulmonary effort is normal.     Breath sounds: Normal breath sounds.  Musculoskeletal:     Comments: Leg raise causes increased muscle spasm in the low back.  There is tenderness along the LS area.  Skin:    Coloration: Skin is not pale.  Neurological:     General: No focal deficit present.     Mental Status: She is alert and oriented to person, place, and time.  Psychiatric:        Behavior: Behavior normal.      UC Treatments / Results  Labs (all labs ordered are listed, but only abnormal results are displayed) Labs Reviewed - No data to display  EKG   Radiology No results found.  Procedures Procedures (including critical care time)  Medications Ordered in UC Medications - No data to display  Initial Impression / Assessment and Plan / UC Course  I have reviewed the triage vital signs and the nursing notes.  Pertinent labs & imaging results that were available during my care of the patient were reviewed by me and considered in my medical decision making (see chart for details).        I am going to send in a small quantity of Valium to use as the muscle relaxer, as nothing else is helping so far.  She can continue taking the Toradol as  needed.  I did review her PMP from her state and continuous states and there was no concerning behavior on the PMP Final Clinical Impressions(s) / UC Diagnoses   Final diagnoses:  Muscle spasm of back     Discharge Instructions      Diazepam 5 mg--try taking 1/2 to 1 tablet every 12 hours as needed for muscle spasm.  This medicine can cause sedation and dizziness     ED Prescriptions     Medication Sig Dispense Auth. Provider   diazepam (VALIUM) 5 MG tablet Take 0.5-1 tablets (2.5-5 mg total) by mouth every 12 (twelve) hours as needed for muscle spasms. 10 tablet Marlinda Mike Janace Aris, MD      I have reviewed the PDMP during this encounter.   Zenia Resides, MD 12/23/22 [phone]
